# Patient Record
Sex: Female | Born: 1950
Health system: Southern US, Community
[De-identification: ages and names within clinical notes are randomized; demographics above are authoritative.]

## PROBLEM LIST (undated history)

## (undated) DIAGNOSIS — E119 Type 2 diabetes mellitus without complications: Secondary | ICD-10-CM

## (undated) DIAGNOSIS — I10 Essential (primary) hypertension: Secondary | ICD-10-CM

## (undated) DIAGNOSIS — E785 Hyperlipidemia, unspecified: Secondary | ICD-10-CM

## (undated) HISTORY — DX: Hyperlipidemia, unspecified: E78.5

## (undated) HISTORY — DX: Essential (primary) hypertension: I10

## (undated) HISTORY — DX: Type 2 diabetes mellitus without complications: E11.9

---

## 1998-07-08 ENCOUNTER — Emergency Department (HOSPITAL_COMMUNITY): Admission: EM | Admit: 1998-07-08 | Discharge: 1998-07-09 | Payer: Self-pay | Admitting: Internal Medicine

## 2001-01-22 ENCOUNTER — Encounter: Admission: RE | Admit: 2001-01-22 | Discharge: 2001-04-22 | Payer: Self-pay | Admitting: Family Medicine

## 2006-08-06 IMAGING — CR DG CHEST 2V
2 series · 2 of 2 positions shown · non-contrast
Comparison: none

CLINICAL DATA: Abdominal pain.  Acute cholecystitis and biliary dilatation.  Preop respiratory exam. 
 CHEST - 2 VIEW:

[w chest lat]
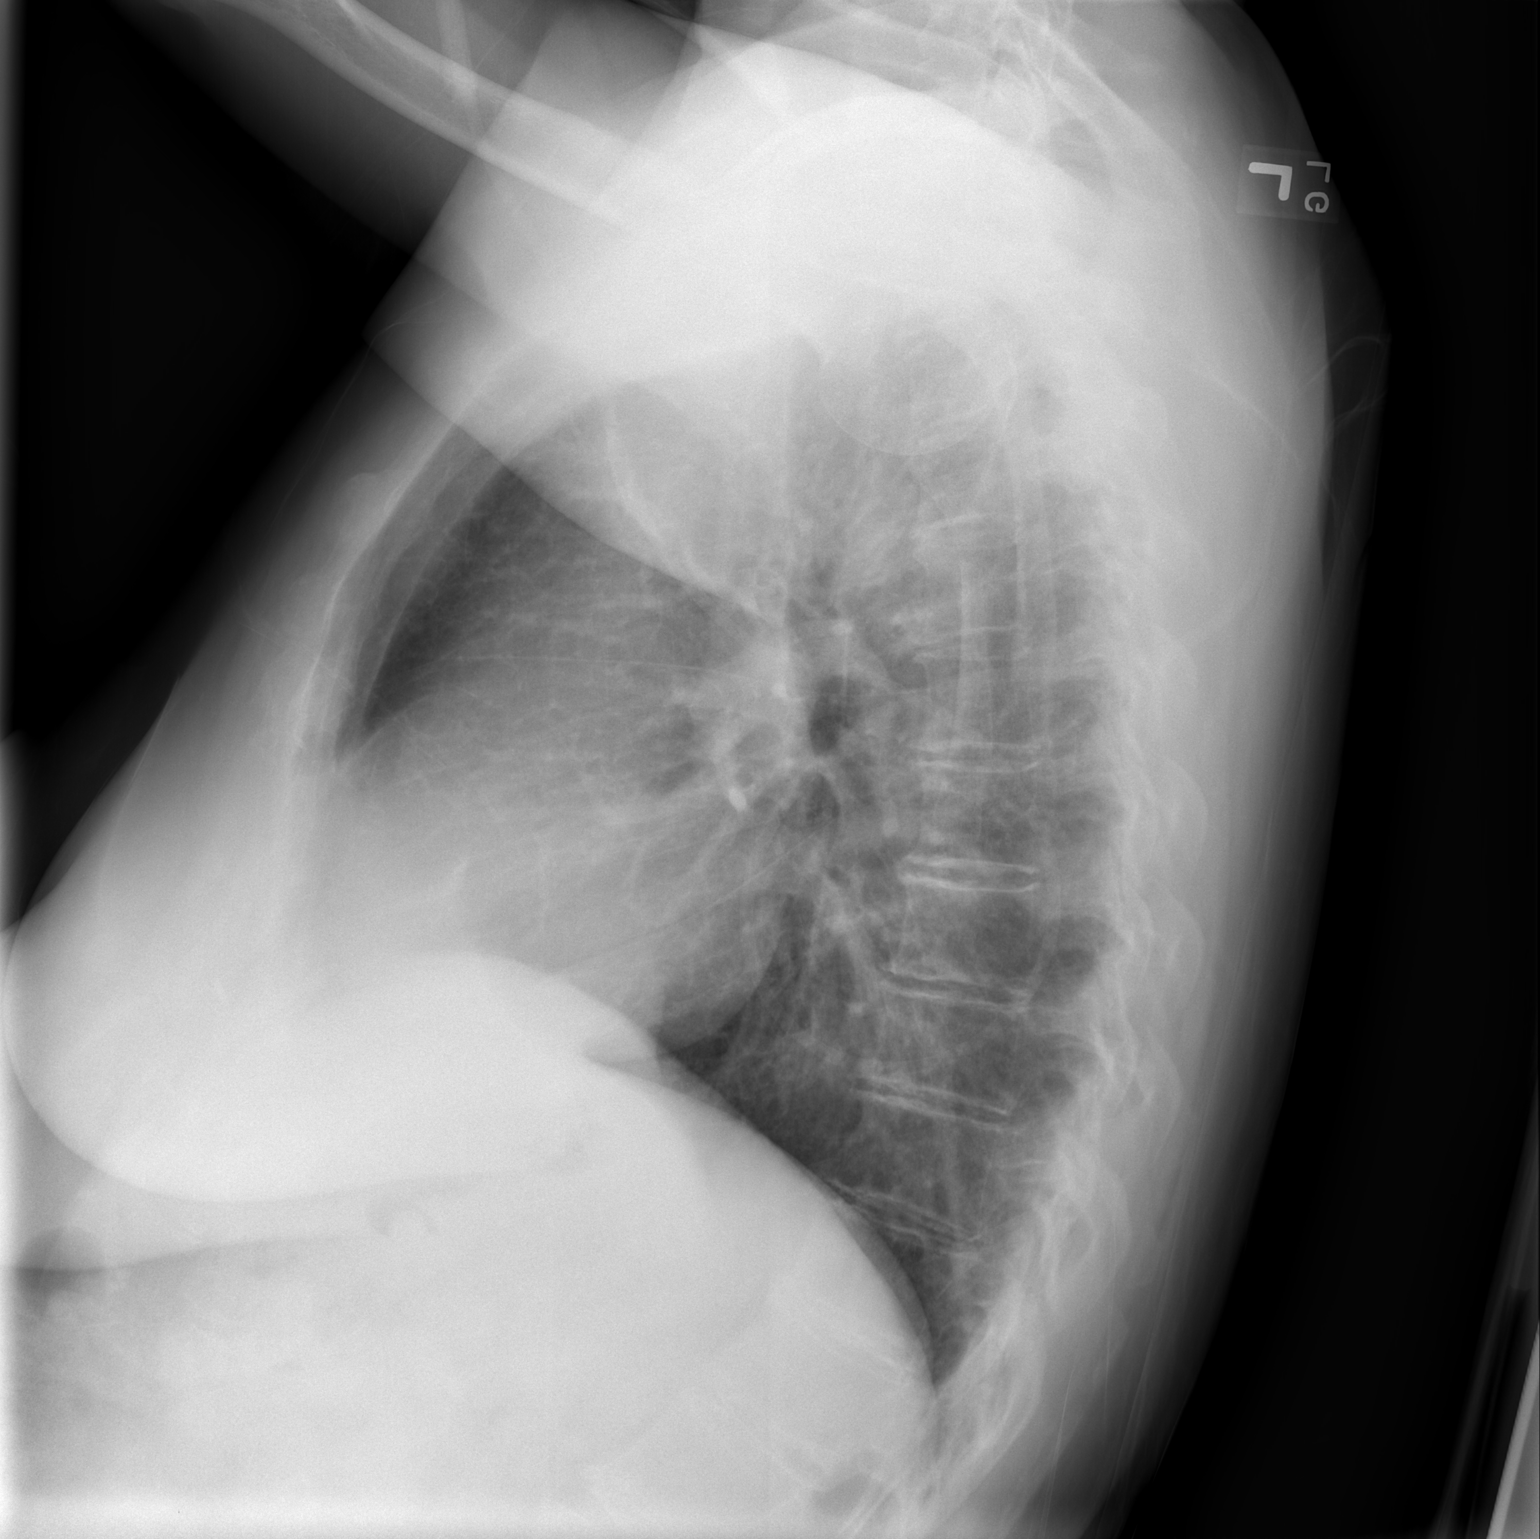

[view not recorded]
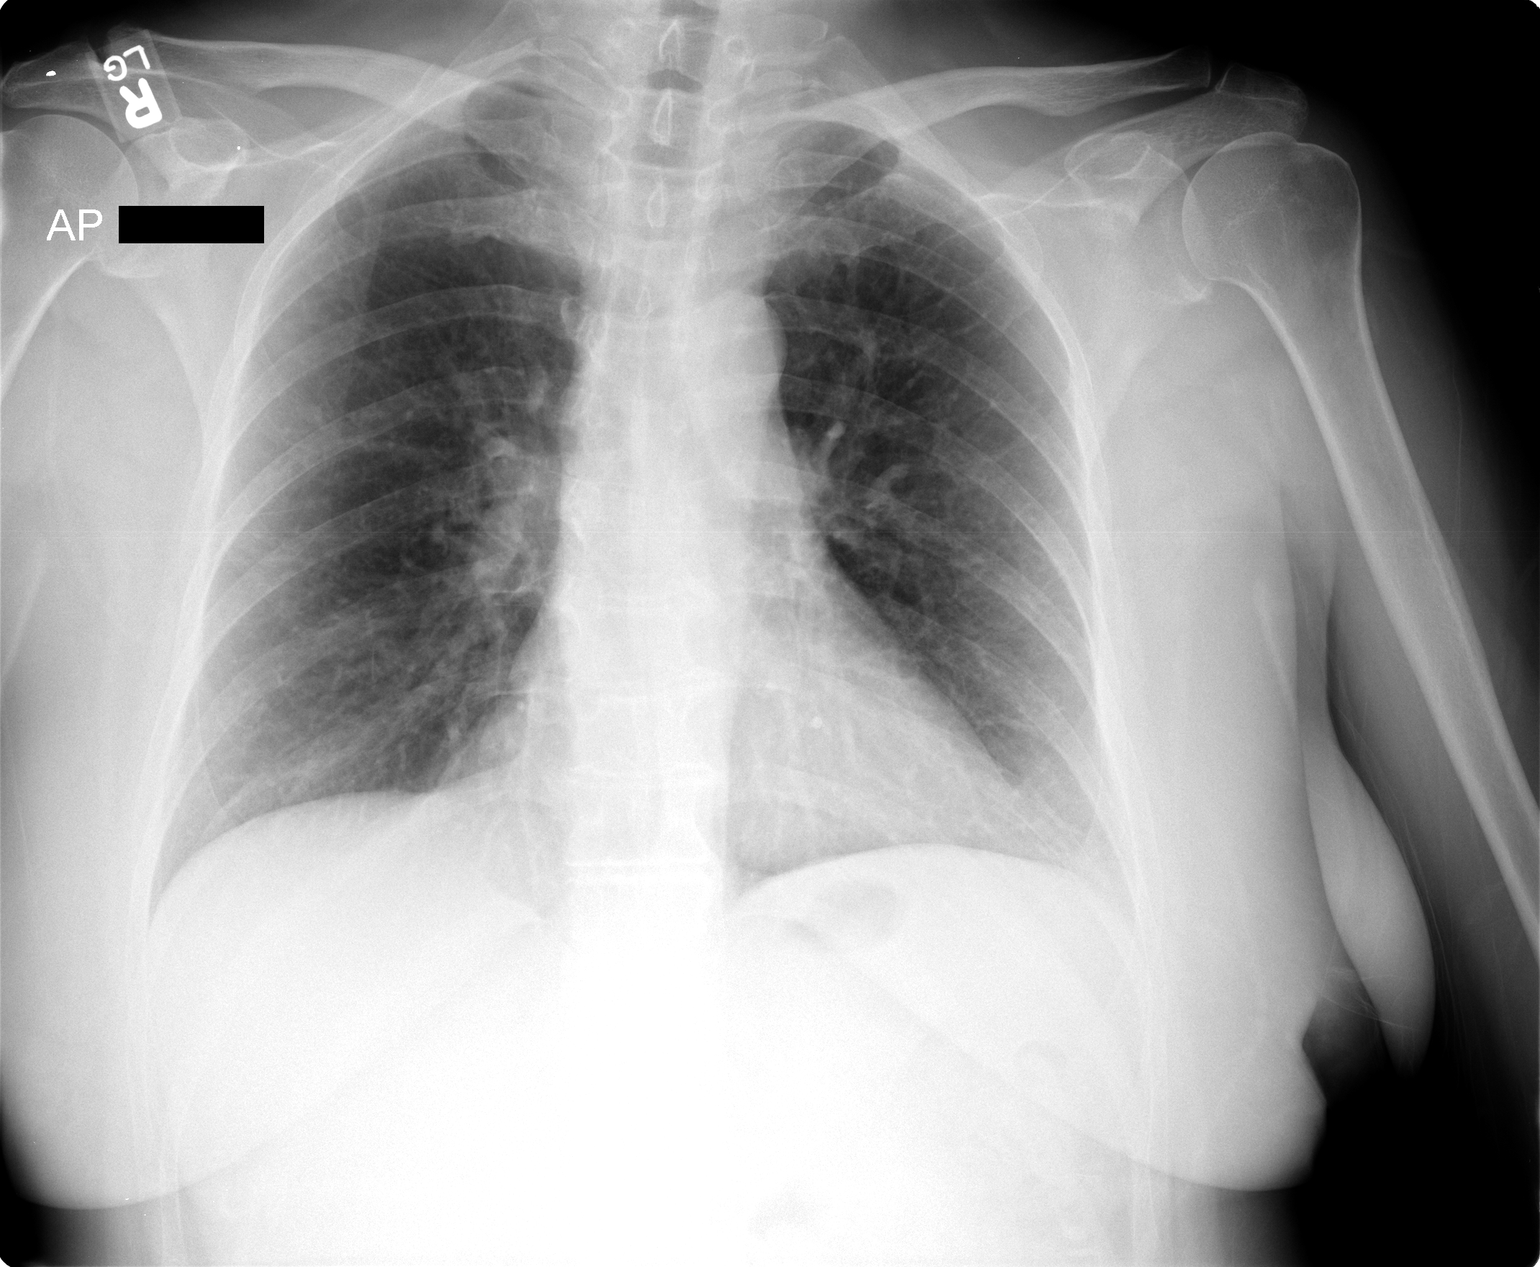

[2 of 2 positions shown; findings below may reference images not displayed]

FINDINGS: The heart size and mediastinal contours are within normal limits.  Both lungs are clear.  The visualized skeletal structures are unremarkable.
IMPRESSION: No active cardiopulmonary disease.

## 2007-09-13 ENCOUNTER — Encounter (INDEPENDENT_AMBULATORY_CARE_PROVIDER_SITE_OTHER): Payer: Self-pay | Admitting: *Deleted

## 2007-09-13 ENCOUNTER — Inpatient Hospital Stay (HOSPITAL_COMMUNITY): Admission: EM | Admit: 2007-09-13 | Discharge: 2007-09-15 | Payer: Self-pay | Admitting: Emergency Medicine

## 2007-09-13 IMAGING — RF DG CHOLANGIOGRAM OPERATIVE
1 series · 5 of 5 positions shown · non-contrast
Comparison: [REDACTED] abdominal ultrasound [DATE].

CLINICAL DATA: Cholecystitis.  Postcholecystectomy.
DIAGNOSTIC CHOLANGIOGRAM OPERATIVE:

[Series 1: run · 2 acquisitions, 5 frames shown]
[im 1/2]
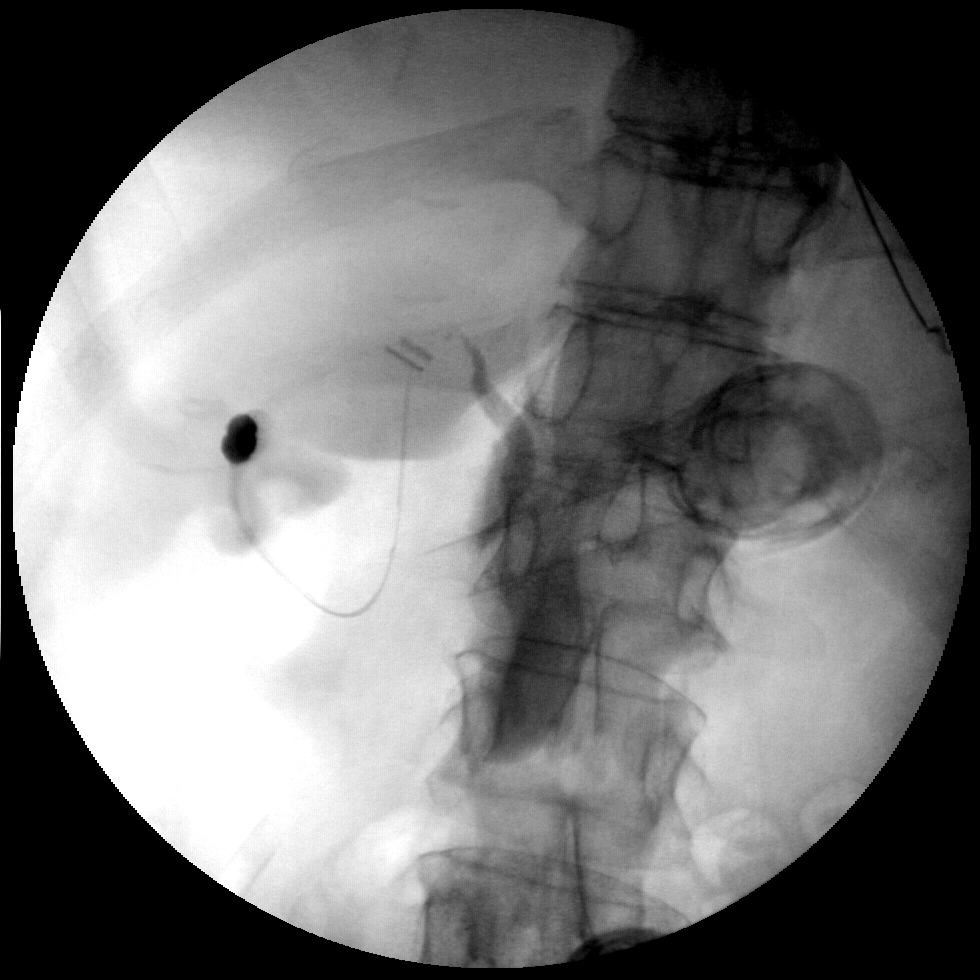
[im 1/2]
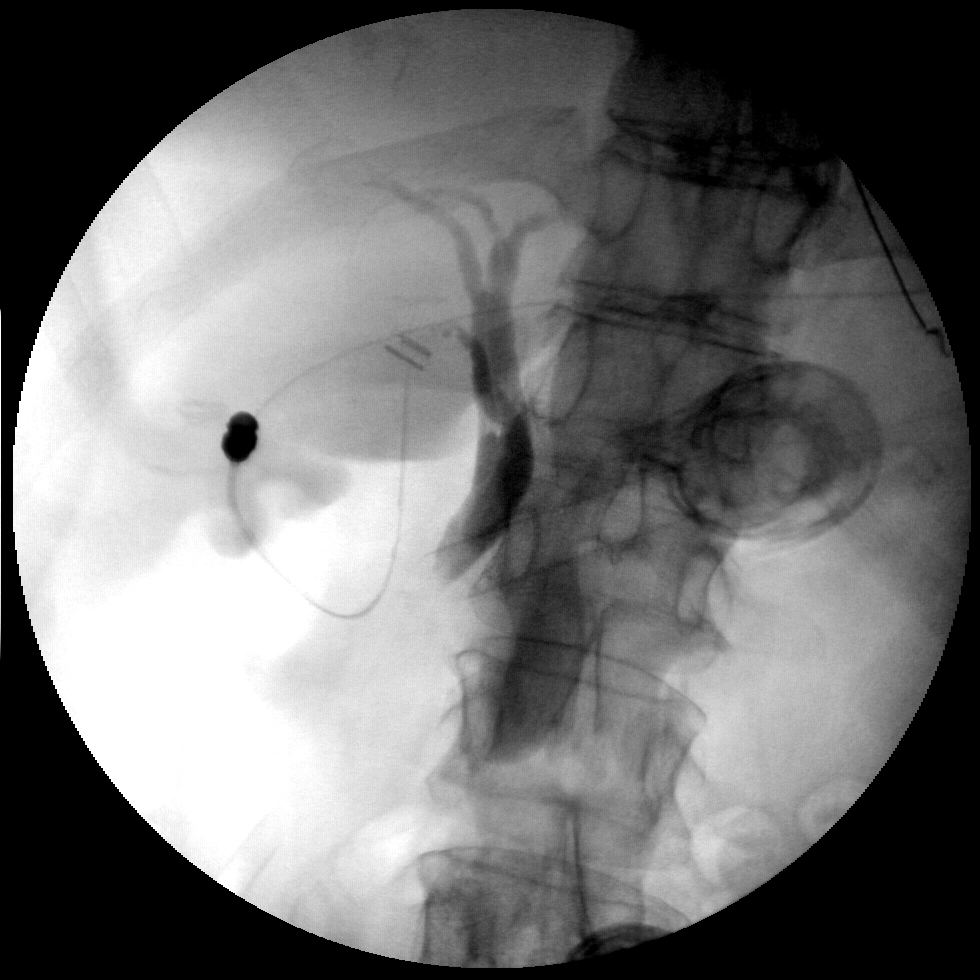
[im 1/2]
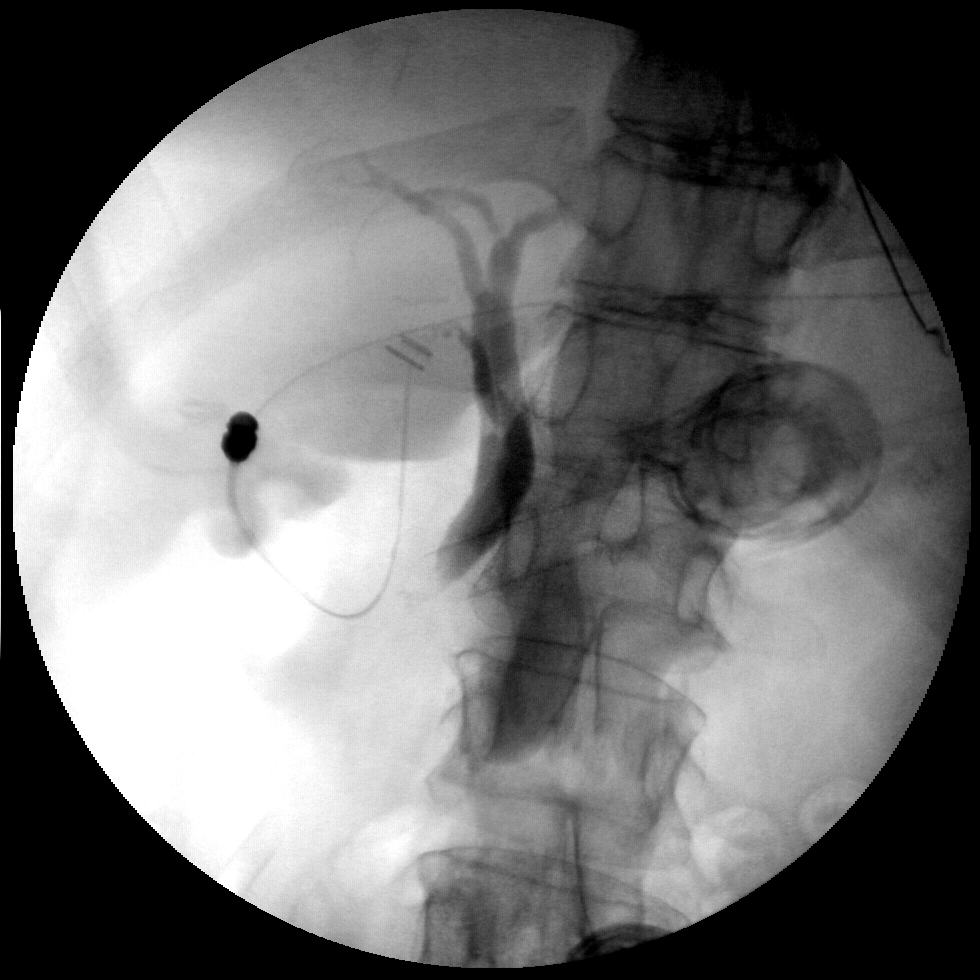
[im 1/2]
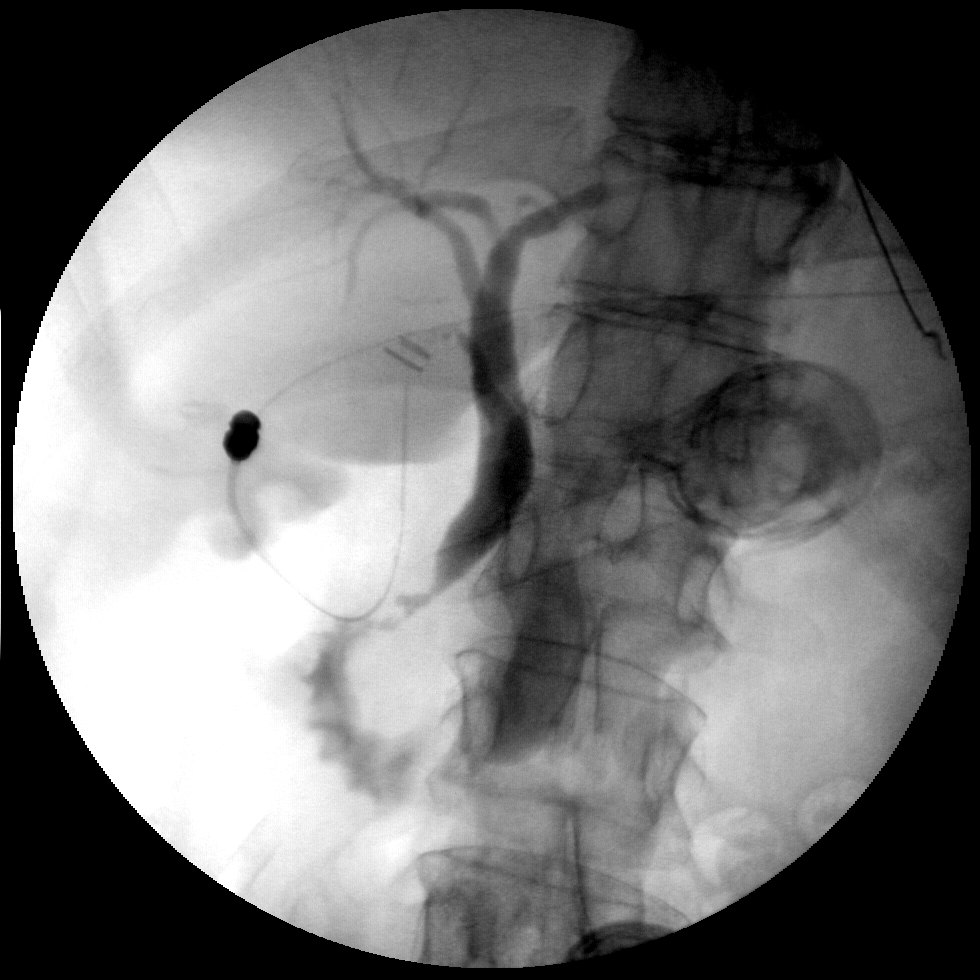
[im 2/2]
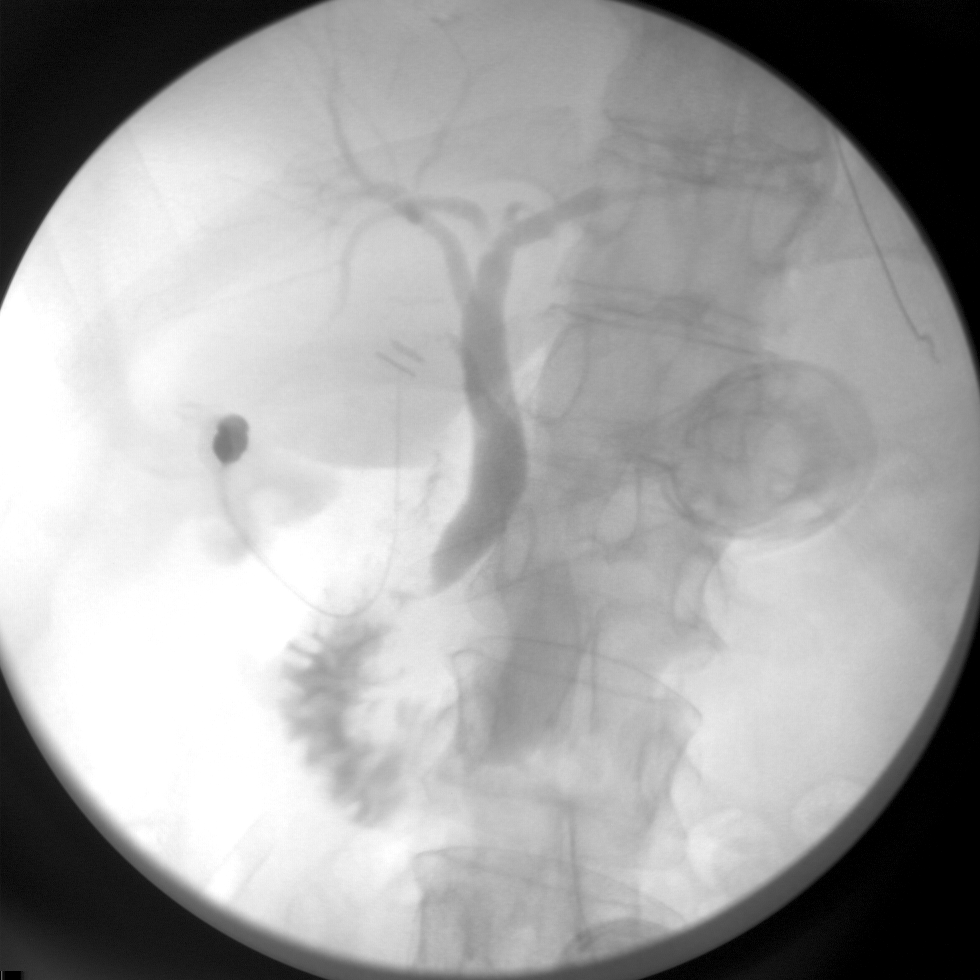

[5 of 5 positions shown; findings below may reference images not displayed]

FINDINGS: Interval postcholecystectomy noted with anatomic variant distal cystic duct insertion at common bile duct with resultant longer cystic duct remnant.  No persistent filling defects to suggest biliary tract retained calculus noted with patency to duodenum demonstrated.  Slight stable fusiform dilatation extrahepatic common bile duct noted.
IMPRESSION: 1.  Postcholecystectomy with anatomic variant longer cystic duct remnant/more distal insertion at common bile duct.
2.  Stable slight dilatation extrahepatic/common bile duct.  
3.  Otherwise no significant abnormality.

## 2007-09-13 IMAGING — US US ABDOMEN COMPLETE
1 series · 14 of 25 positions shown · non-contrast
Comparison: none

HISTORY: Abdominal pain

ULTRASOUND ABDOMEN COMPLETE:
TECHNIQUE: Complete abdominal ultrasound examination was performed including
evaluation of the liver, gallbladder, bile ducts, pancreas, kidneys, spleen,
IVC, and abdominal aorta.

[Series 1: unknown · 0.33mm/px · 14 of 86 slices shown]
[im 1/86]
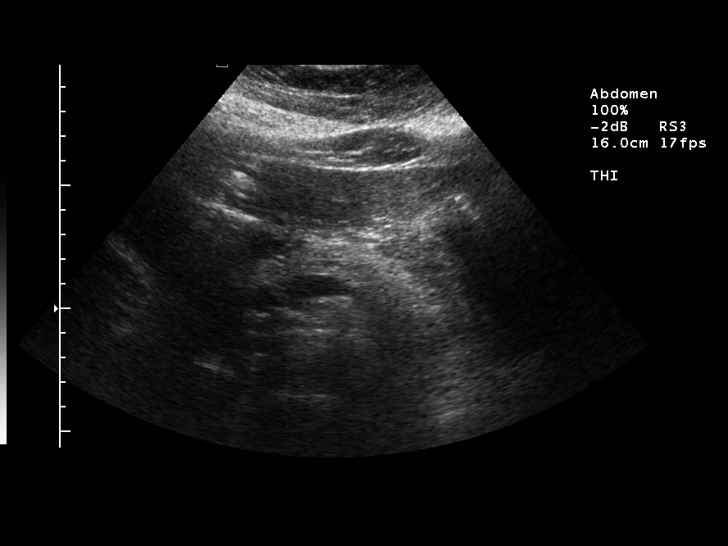
[im 8/86]
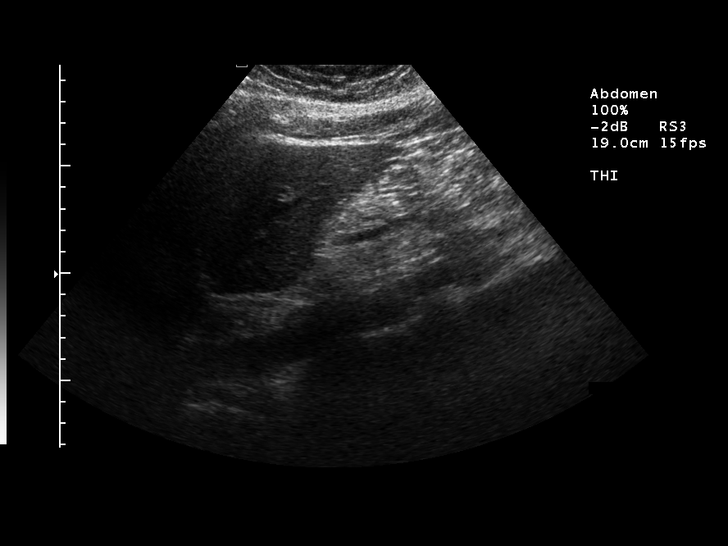
[im 15/86]
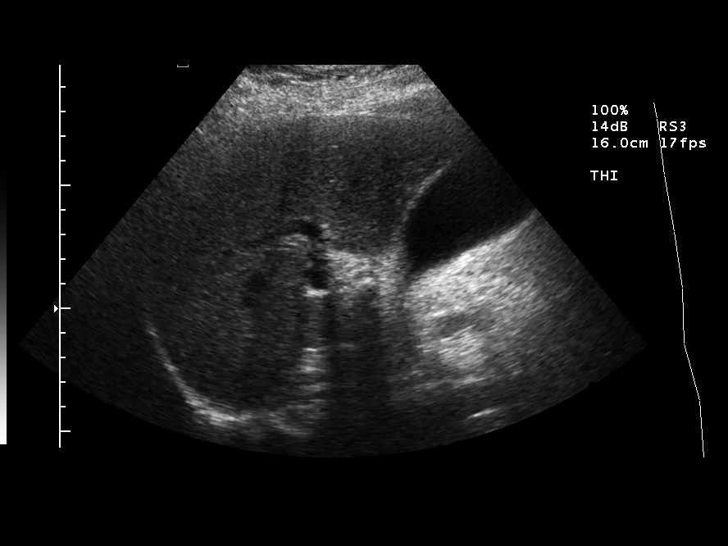
[im 22/86]
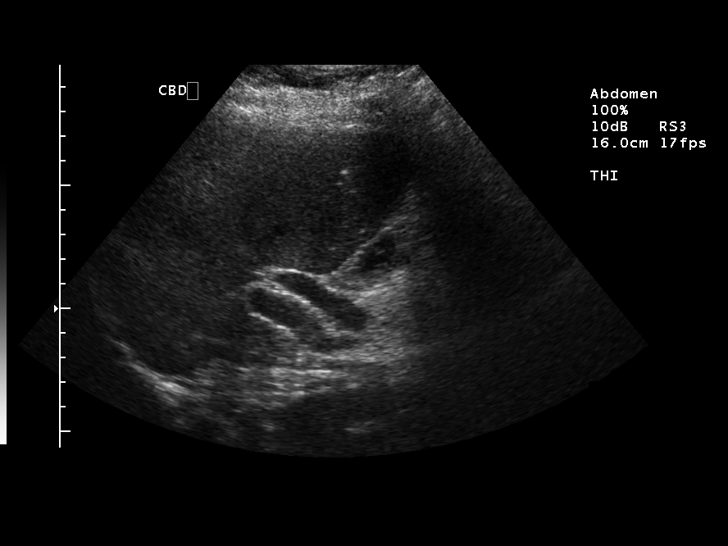
[im 29/86]
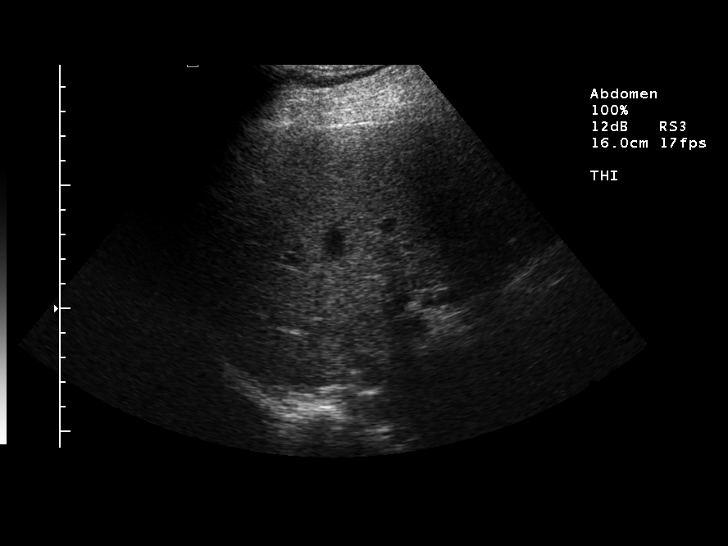
[im 32/86]
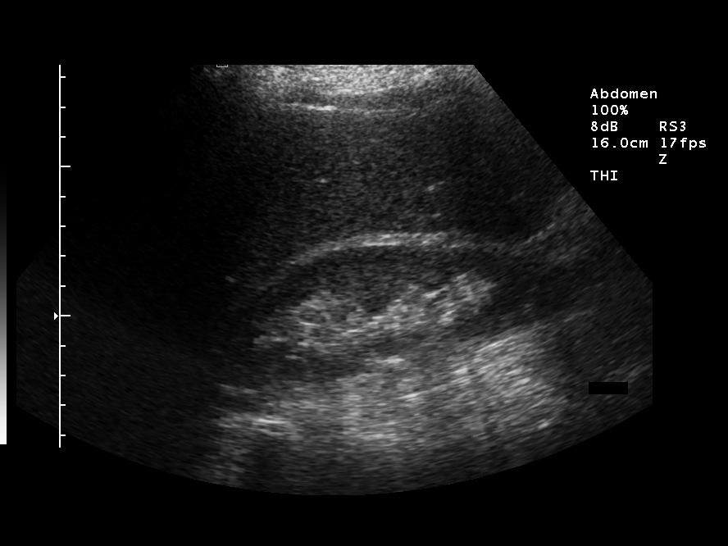
[im 39/86]
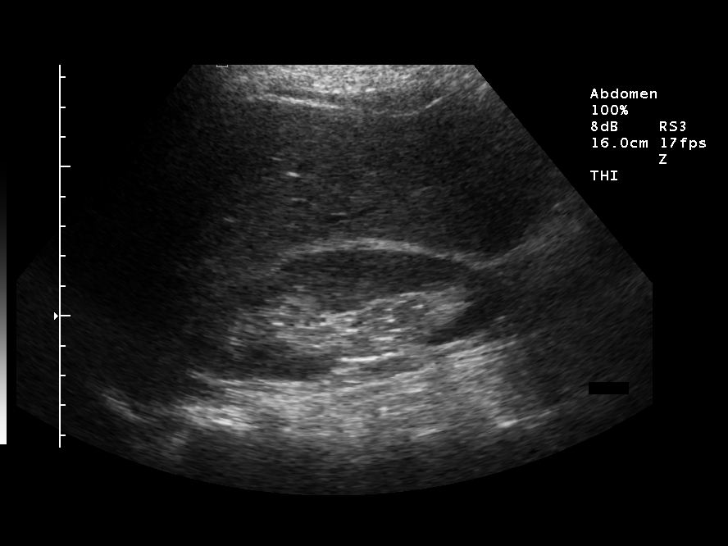
[im 47/86]
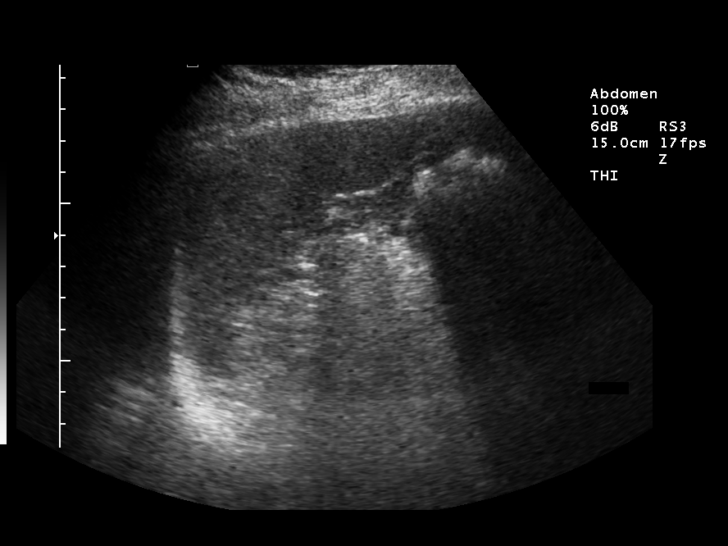
[im 54/86]
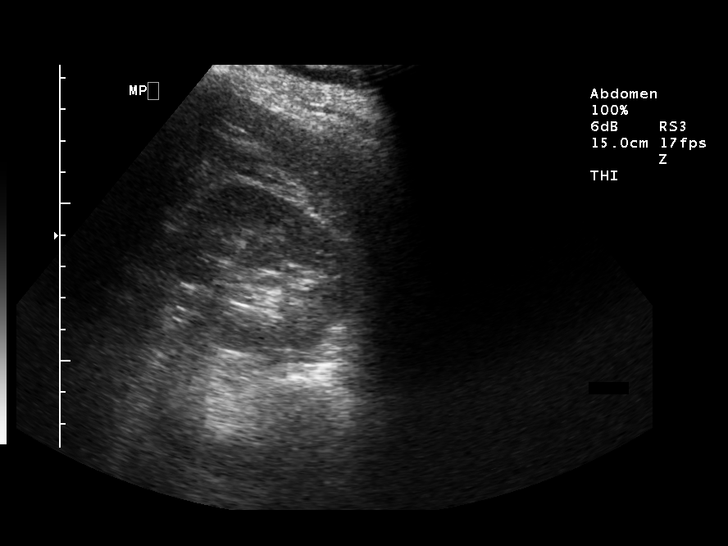
[im 57/86]
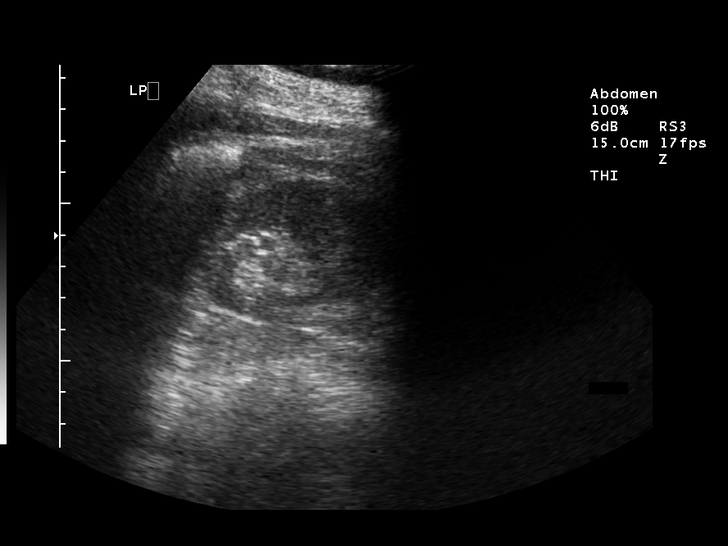
[im 64/86]
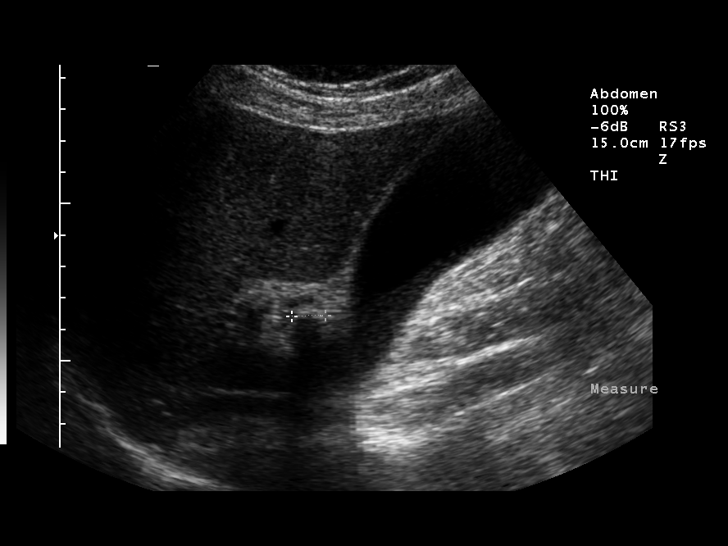
[im 71/86]
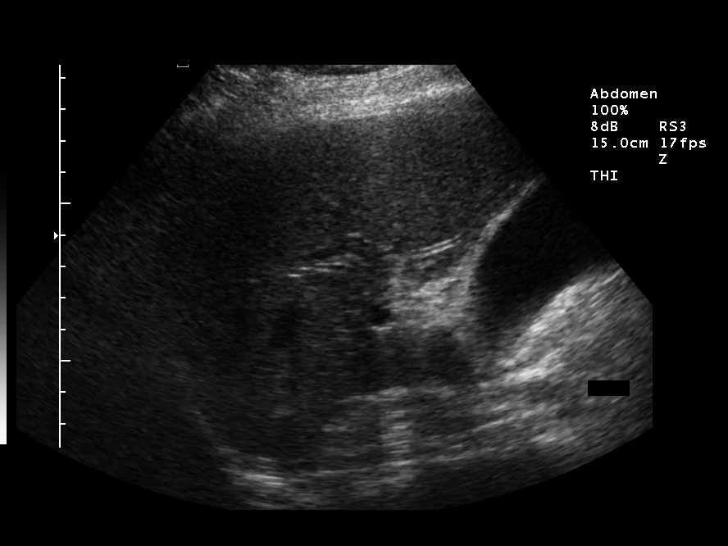
[im 78/86]
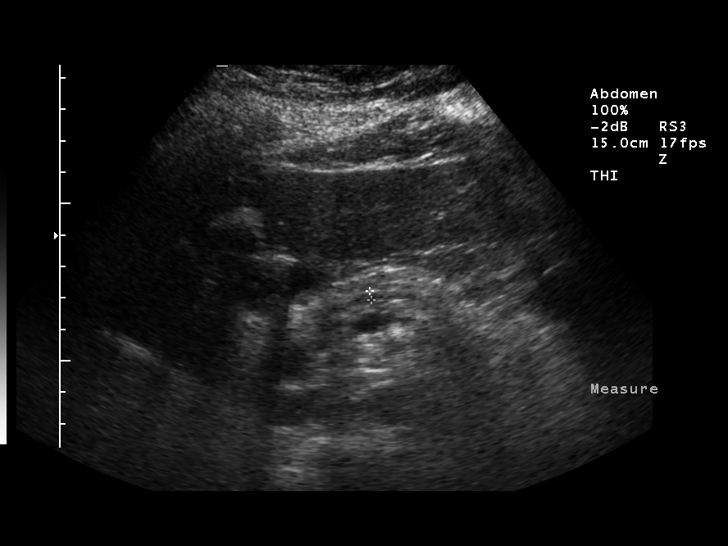
[im 86/86]
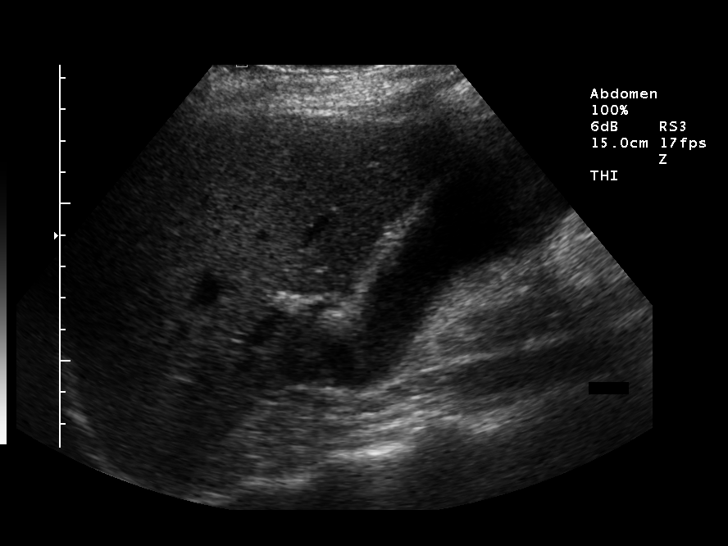

[14 of 25 positions shown; findings below may reference images not displayed]

Dependent sludge in gallbladder.
Shadowing echogenic focus is seen in gallbladder neck suspicious for gallstone,
approximately 10 mm diameter.
Gallbladder wall upper normal thickness 3 mm diameter.
Common bile duct mildly dilated 9 mm diameter.
Mild central intrahepatic biliary dilatation.
No focal mass lesion of liver, pancreas, or spleen, spleen 8.3 cm length.
Common bile duct remains dilated at pancreatic head.
Kidneys normal size and morphology, 10.7 cm length right and 11.4 cm left.
Aorta and IVC normal.
No free fluid.
IMPRESSION: Probable shadowing gallstone at gallbladder neck with small amount of
gallbladder sludge and presence of sonographic Murphy's sign.
Findings may represent early acute cholecystitis.
Dilated common bile duct, cannot exclude distal CBD obstruction, recommend
correlation with LFTs.
Remainder of exam unremarkable.

## 2008-03-03 ENCOUNTER — Emergency Department (HOSPITAL_COMMUNITY): Admission: EM | Admit: 2008-03-03 | Discharge: 2008-03-03 | Payer: Self-pay | Admitting: Emergency Medicine

## 2008-03-03 IMAGING — CR DG ABDOMEN ACUTE W/ 1V CHEST
3 series · 3 of 3 positions shown · non-contrast
Comparison: Abdominal ultrasound [DATE] and chest x-ray
[DATE]

CLINICAL DATA: Abdominal pain

ACUTE ABDOMEN SERIES (ABDOMEN 2 VIEW & CHEST 1 VIEW)

[w chest pa]
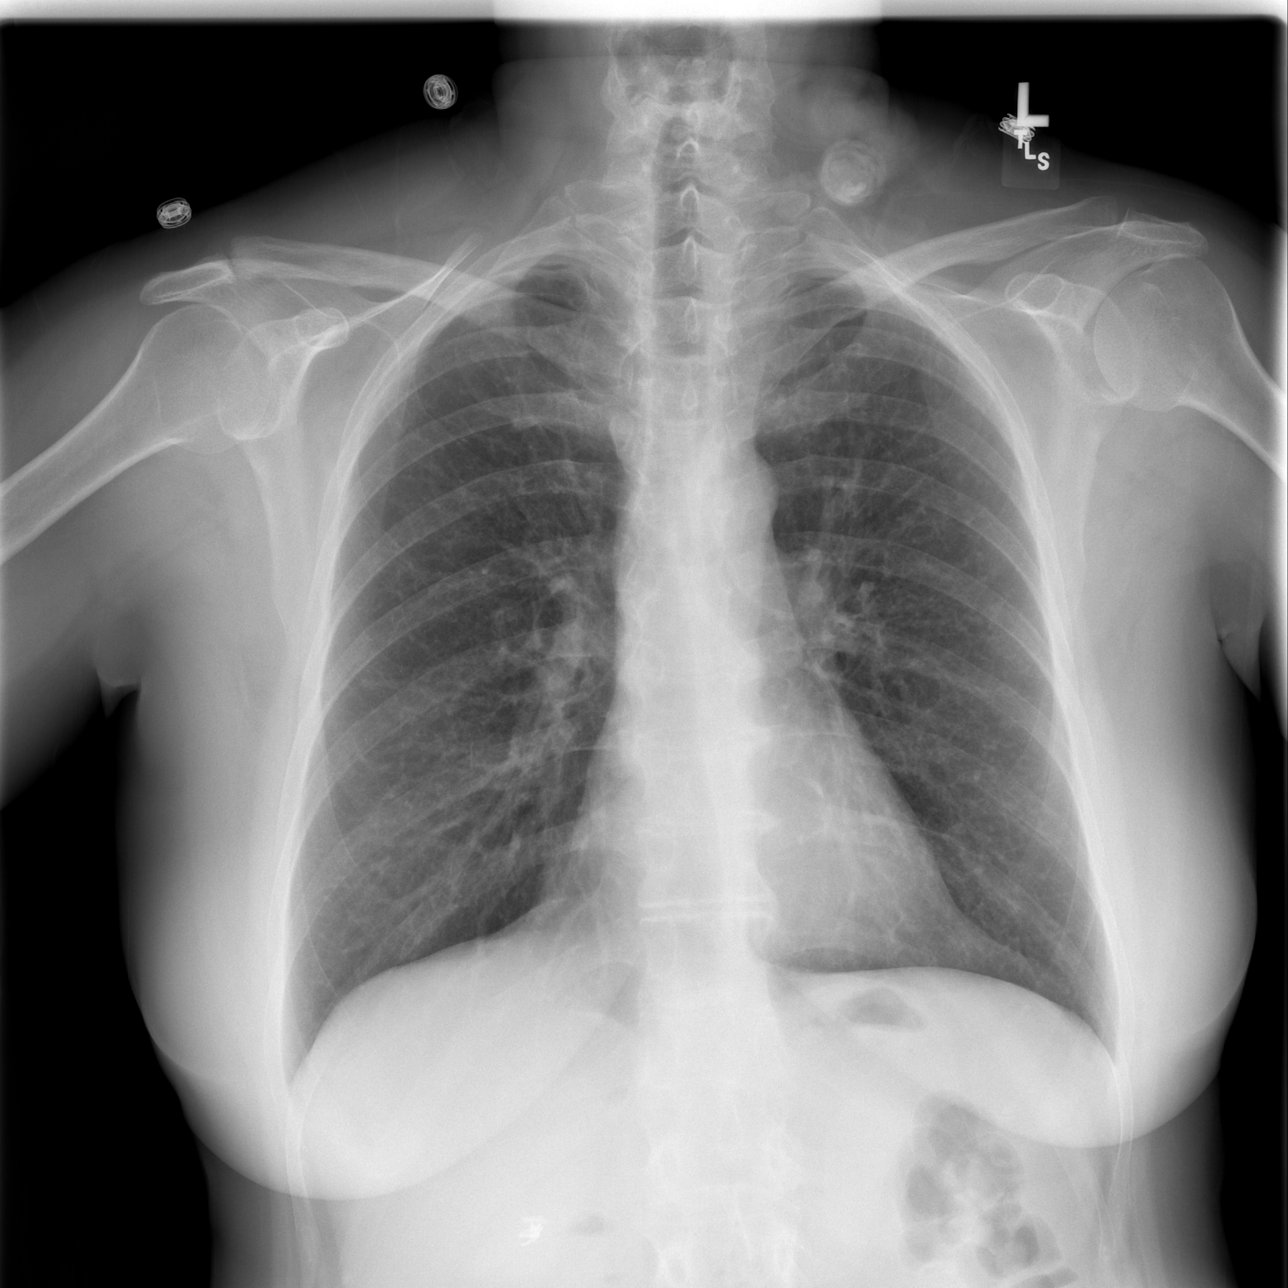

[w abdomen upright]
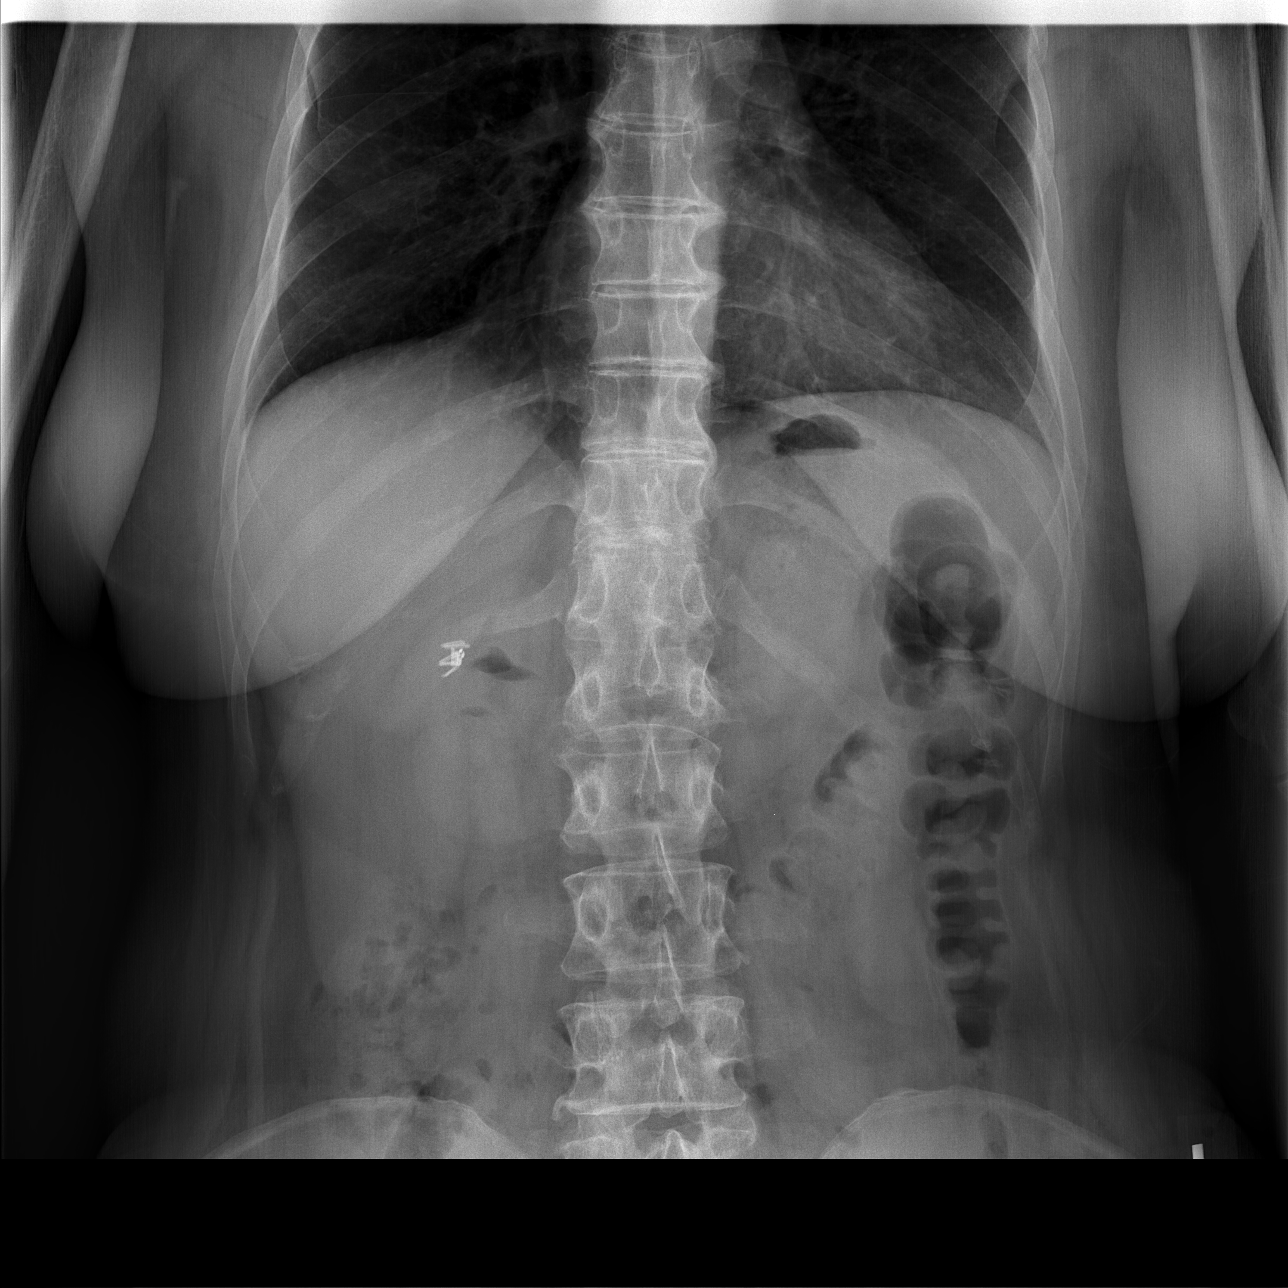

[t abdomen supine]
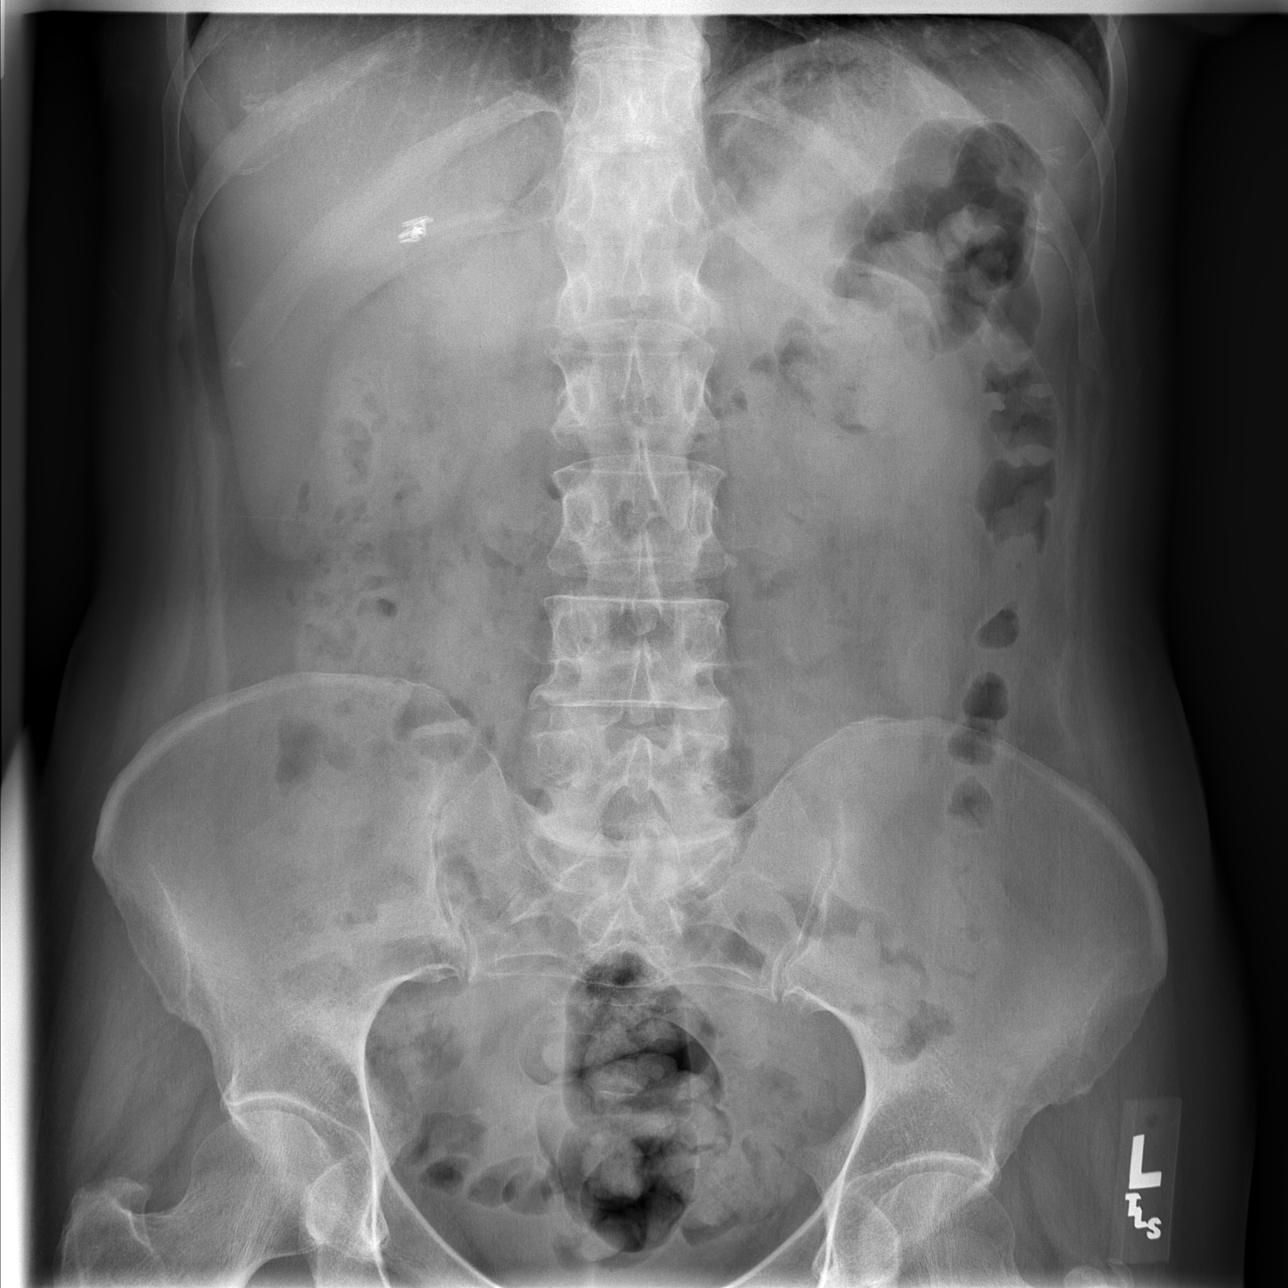

[3 of 3 positions shown; findings below may reference images not displayed]

FINDINGS: The heart and mediastinal contours are within normal
limits and the trachea is midline.  Both lungs are clear.  A
rounded density projecting to the left of the lower cervical spine
was not present on the prior radiograph most likely relates to the
patient's hair.

No free intraperitoneal air beneath the diaphragm.  Cholecystectomy
clips are noted in the right upper quadrant.

The bowel gas pattern is nonobstructive.  No radiopaque calculi are
identified along the expected course of the urinary tract.  There
is small osteophyte formation associated with L3 and L4  and in the
lower thoracic spine.  A tiny calcification in the patient's right
pelvis is favored to represent a phlebolith.
IMPRESSION: 1.  No evidence of acute cardiopulmonary disease.
2.  Nonobstructive bowel gas pattern.
3.  Status post cholecystectomy.

## 2011-04-05 NOTE — Op Note (Signed)
Morgan Ruiz, Morgan Ruiz             ACCOUNT NO.:  [id number]   MEDICAL RECORD NO.:  [id number]          PATIENT TYPE:  INP   LOCATION:  1405                         FACILITY:  [facility]   PHYSICIAN:  Alfonse Ras, MD   DATE OF BIRTH:  03/29/1951   DATE OF PROCEDURE:  09/13/2007  DATE OF DISCHARGE:                               OPERATIVE REPORT   PREOPERATIVE DIAGNOSIS:  Acute cholecystitis.   POSTOPERATIVE DIAGNOSIS:  Acute cholecystitis.   PROCEDURE:  Laparoscopic cholecystectomy with intraoperative  cholangiogram.   SURGEON:  Alfonse Ras, M.D.   ANESTHESIA:  General endotracheal tube.   FINDINGS:  Gangrenous cholecystitis.   DESCRIPTION OF PROCEDURE:  The patient was taken to the operating room,  placed in supine position.  After adequate general anesthesia was  induced using an endotracheal tube, the abdomen was prepped and draped  in the normal sterile fashion.  Using a transverse infraumbilical  incision, I dissected down to fascia.  The fascia was opened vertically.  A 0 Vicryl pursestring suture was placed around the fascial defect.  Hasson trocar was placed in the abdomen and pneumoperitoneum was  obtained.  Under direct vision, a 11 mm trocar was placed in the  subxiphoid region and two 5 mm trocars were placed in the right abdomen.  A very edematous, thick-walled gallbladder was identified with a lot of  omentum adherent to it.  This was taken down with blunt dissection.  The  gallbladder was then aspirated and retracted cephalad.  There was a  significant amount of woody adhesions to the base of the gallbladder.  After tedious dissection, critical view was obtained of the cystic duct.  It was clipped proximally.  Small ductotomy was made and cholangiogram  was performed, which showed normal filling of the common duct, free flow  into the duodenum, and normal filling of the right and left hepatic  ducts.  The cholangiocatheter was removed.  Cystic duct was  triply  clipped and divided.  Cystic artery was dissected in a similar fashion,  triply   Dictation ended at this point.      Alfonse Ras, MD  Electronically Signed     KRE/MEDQ  D:  09/13/2007  T:  09/13/2007  Job:  [id number]

## 2011-04-05 NOTE — H&P (Signed)
NAMEMYLEI, BRACKEEN             ACCOUNT NO.:  [id number]   MEDICAL RECORD NO.:  [id number]          PATIENT TYPE:  INP   LOCATION:  1405                         FACILITY:  [facility]   PHYSICIAN:  Sandria Bales. Ezzard Standing, M.D.  DATE OF BIRTH:  12/17/1950   DATE OF ADMISSION:  09/12/2007  DATE OF DISCHARGE:                              HISTORY & PHYSICAL   HISTORY OF ILLNESS:  This is a 60 year old white female who was seen by  Dr. Leslye Peer at [place], who is originally from [place], then moved to  [place], then in 1999 came to the [place] to follow her daughter.  She has had some vague GI upset since she has been in the [place],  but as best I could tell has not had any evaluation.  She has somewhat  of an accent and there may be a little bit of a language barrier but I  think she understands pretty well what is going on.   Over the last 10 days she has had worsening abdominal pain.  Over the  last 24 hours it got bad, had to go to [employer].  She was then sent to  [facility] last night where she underwent an evaluation.  An  ultrasound showed shadowing gallstones in the neck of the gallbladder.  She had an elevated white blood count to 16,000.  And, I was consulted  for evaluation of probable cholecystitis.  She has had no prior abdominal surgery, denies history of peptic ulcer  disease, liver disease, or colon disease.   PAST MEDICAL HISTORY:  She has no allergies.   CURRENT MEDICATIONS:  1. Amoxicillin that she is taking for a sore throat.  2. Toradol.   REVIEW OF SYSTEMS:  NEUROLOGIC:  Denies seizure or loss of  consciousness.  PULMONARY:  She smokes cigarettes.  CARDIAC:  There is no heart disease or chest pain.  GASTROINTESTINAL:  See history of present illness.  UROLOGIC:  No history of kidney stones or kidney infections.   She apparently has just lost her job but was working at some type of  [employer].  Her husband works, it sounds like, he is kind of  a  maintenance person at the [employer].  He is not in the room  with her.   PHYSICAL EXAMINATION:  VITAL SIGNS:  Her temperature is 97.6.  Her pulse  is 76.  Her blood pressure is 128/73.  GENERAL:  She is a well nourished, middle weight, older female.  She  looks a little bit older than her stated age.  HEENT:  She is missing some teeth.  Her HEENT is unremarkable.  NECK:  Supple.  I feel no mass, no thyromegaly.  She is maybe a little  sore along her left neck.  LUNGS:  Clear to auscultation.  HEART:  Regular regular rate and rhythm without murmur or rub.  ABDOMEN:  Soft, though she is tender and sore in the right upper  quadrant.  I am not sure I feel a mass.  I feel no hernia.  She has no  other abdominal scars.  EXTREMITIES:  She has good strength in all 4 extremities.  NEUROLOGIC:  Grossly intact.   LABORATORY DATA:  Shows a white blood cell count of 16,100, hemoglobin  14, hematocrit 41.  Her sodium is 136, potassium 3.7, CO2 25, glucose  125.  Her SGOT 198, SGPT 116.  Her alk phos is 82, total bilirubin 0.7,  lipase is 24.   She had an ultrasound of her gallbladder again that shows what looks  like some gallbladder sludge and a stone impacted in the neck of her  gallbladder.   IMPRESSION:  1. Cholelithiasis with a cholecystitis.  I discussed with the patient      about proceeding with surgery.  I discussed with her the      indications and potential complications of gallbladder surgery.  I      talked to her about infection which I think she already has as she      is on antibiotics, the possibility of bleeding from surgery, open      surgery, possible common duct injury.  I told her that Dr. Baruch Merl in on call for [place] today and I will be  speaking with Dr. Colin Benton about managing her care.  1. Smokes cigarettes.  2. Recent upper respiratory infections.      Sandria Bales. Ezzard Standing, M.D.  Electronically Signed     DHN/MEDQ  D:  09/13/2007  T:  09/13/2007   Job:  [id number]   cc:   Dr. Leslye Peer at [facility]

## 2011-08-16 LAB — DIFFERENTIAL
Basophils Relative: 0
Lymphocytes Relative: 13
Lymphs Abs: 1.5
Monocytes Relative: 4
Neutrophils Relative %: 83 — ABNORMAL HIGH

## 2011-08-16 LAB — POCT CARDIAC MARKERS
CKMB, poc: <1 — ABNORMAL LOW
Myoglobin, poc: 42.3
Operator id: 146091

## 2011-08-16 LAB — CBC
HCT: 40.5
MCHC: 34.2
MCV: 88.7
RBC: 4.57
WBC: 12.1 — ABNORMAL HIGH

## 2011-08-16 LAB — URINALYSIS, ROUTINE W REFLEX MICROSCOPIC
Glucose, UA: NEGATIVE
Hgb urine dipstick: NEGATIVE
Nitrite: NEGATIVE
Protein, ur: NEGATIVE
Specific Gravity, Urine: 1.01
Urobilinogen, UA: 1
pH: 7.5

## 2011-08-16 LAB — POCT I-STAT, CHEM 8
BUN: 16
HCT: 42
Hemoglobin: 14.3
Sodium: 139

## 2011-08-16 LAB — LIPASE, BLOOD: Lipase: 32

## 2011-08-16 LAB — HEPATIC FUNCTION PANEL
Albumin: 4
Bilirubin, Direct: 0.1
Total Bilirubin: 0.6
Total Protein: 7.3

## 2011-08-31 LAB — CBC
HCT: 34.3 — ABNORMAL LOW
HCT: 38.8
HCT: 41
Hemoglobin: 11.8 — ABNORMAL LOW
Hemoglobin: 14.1
MCHC: 34.4
MCHC: 34.4
MCV: 87.3
MCV: 87.7
Platelets: 378
Platelets: 452 — ABNORMAL HIGH
RBC: 3.92
RBC: 4.45
RBC: 4.7
RDW: 12.3
RDW: 12.6
RDW: 12.8
WBC: 12.1 — ABNORMAL HIGH
WBC: 16.1 — ABNORMAL HIGH
WBC: 18 — ABNORMAL HIGH

## 2011-08-31 LAB — COMPREHENSIVE METABOLIC PANEL
ALT: 116 — ABNORMAL HIGH
ALT: 381 — ABNORMAL HIGH
AST: 104 — ABNORMAL HIGH
AST: 98 — ABNORMAL HIGH
Albumin: 2.8 — ABNORMAL LOW
Albumin: 3.8
Alkaline Phosphatase: 148 — ABNORMAL HIGH
Alkaline Phosphatase: 82
BUN: 6
BUN: 7
CO2: 25
CO2: 31
Calcium: 9.3
Calcium: 9.6
Chloride: 102
Chloride: 103
Creatinine, Ser: 0.59
Creatinine, Ser: 0.75
GFR calc Af Amer: >60
GFR calc Af Amer: >60
GFR calc non Af Amer: >60
GFR calc non Af Amer: >60
Glucose, Bld: 122 — ABNORMAL HIGH
Glucose, Bld: 125 — ABNORMAL HIGH
Potassium: 3.7
Potassium: 3.9
Sodium: 136
Sodium: 139
Total Bilirubin: 0.7
Total Bilirubin: 0.9
Total Protein: 6.5
Total Protein: 7.5

## 2011-08-31 LAB — DIFFERENTIAL
Basophils Absolute: 0
Basophils Absolute: 0
Basophils Relative: 0
Eosinophils Absolute: 0.2
Eosinophils Relative: 0
Eosinophils Relative: 1
Lymphocytes Relative: 16
Lymphs Abs: 2.6
Monocytes Absolute: 0.9 — ABNORMAL HIGH
Monocytes Absolute: 0.9 — ABNORMAL HIGH
Monocytes Relative: 6
Neutro Abs: 12.4 — ABNORMAL HIGH
Neutro Abs: 8.6 — ABNORMAL HIGH
Neutrophils Relative %: 77

## 2011-08-31 LAB — LIPASE, BLOOD: Lipase: 24

## 2011-08-31 LAB — URINALYSIS, ROUTINE W REFLEX MICROSCOPIC: Nitrite: NEGATIVE

## 2011-08-31 LAB — CARDIAC PANEL(CRET KIN+CKTOT+MB+TROPI)
CK, MB: 0.5
Relative Index: INVALID
Total CK: 54
Troponin I: 0.01

## 2011-09-26 ENCOUNTER — Other Ambulatory Visit: Payer: Self-pay | Admitting: Infectious Diseases

## 2011-09-26 ENCOUNTER — Ambulatory Visit
Admission: RE | Admit: 2011-09-26 | Discharge: 2011-09-26 | Disposition: A | Discharge status: Home / Self Care | Payer: PRIVATE HEALTH INSURANCE | Source: Ambulatory Visit | Attending: Infectious Diseases | Admitting: Infectious Diseases

## 2011-09-26 DIAGNOSIS — R7611 Nonspecific reaction to tuberculin skin test without active tuberculosis: Secondary | ICD-10-CM

## 2011-09-26 IMAGING — CR DG CHEST 1V
1 series · 1 of 1 positions shown · non-contrast
Comparison: Two-view chest radiograph [DATE].

CLINICAL DATA: Positive PPD.

CHEST - 1 VIEW

[view not recorded]
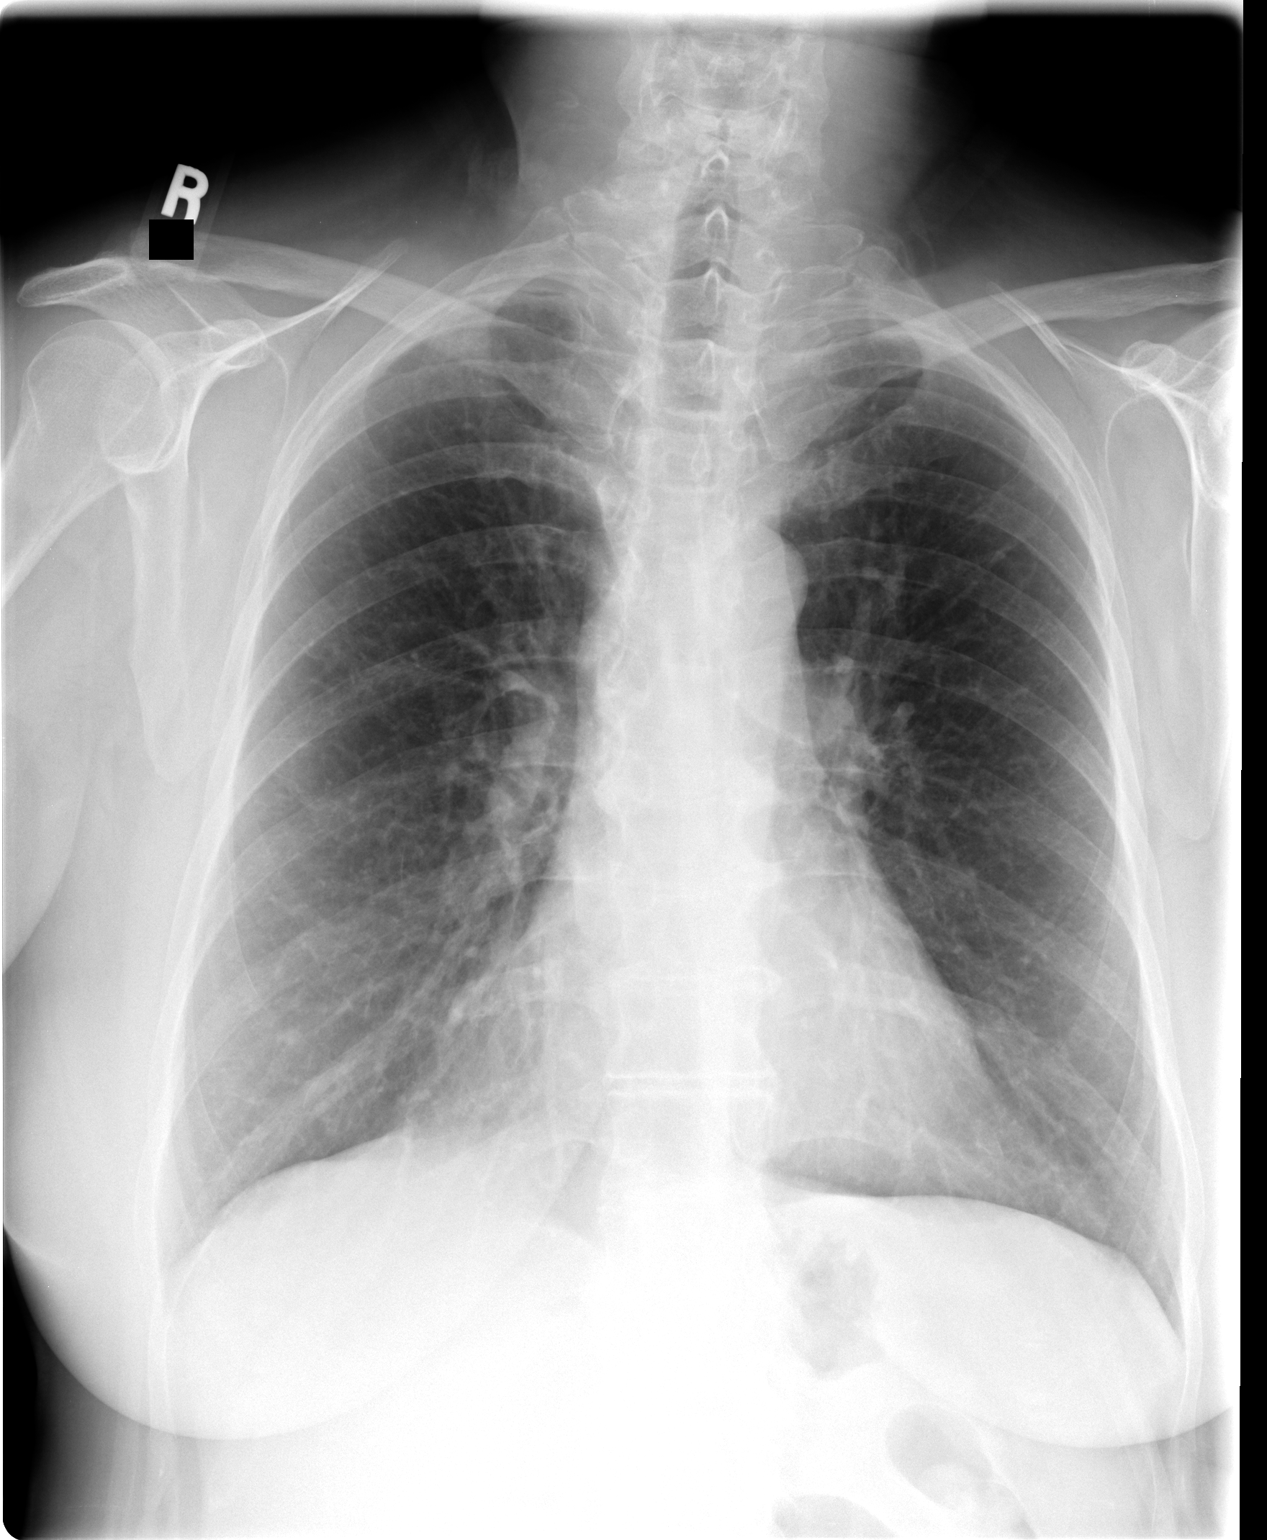

[1 of 1 positions shown; findings below may reference images not displayed]

FINDINGS: The heart size is normal.  The lungs are clear.  The
visualized soft tissues and bony thorax are unremarkable.  Surgical
clips are noted in the gallbladder fossa.
IMPRESSION: Negative chest.

## 2016-01-24 ENCOUNTER — Other Ambulatory Visit: Payer: Self-pay | Admitting: Internal Medicine

## 2016-01-24 ENCOUNTER — Ambulatory Visit (INDEPENDENT_AMBULATORY_CARE_PROVIDER_SITE_OTHER): Payer: BLUE CROSS/BLUE SHIELD | Admitting: Internal Medicine

## 2016-01-24 VITALS — BP 124/60 | HR 68 | Temp 97.7°F | Resp 12 | Ht 65.5 in | Wt 181.0 lb

## 2016-01-24 DIAGNOSIS — I10 Essential (primary) hypertension: Secondary | ICD-10-CM | POA: Insufficient documentation

## 2016-01-24 DIAGNOSIS — R630 Anorexia: Secondary | ICD-10-CM

## 2016-01-24 DIAGNOSIS — R5383 Other fatigue: Secondary | ICD-10-CM

## 2016-01-24 DIAGNOSIS — E119 Type 2 diabetes mellitus without complications: Secondary | ICD-10-CM | POA: Diagnosis not present

## 2016-01-24 DIAGNOSIS — E785 Hyperlipidemia, unspecified: Secondary | ICD-10-CM | POA: Diagnosis not present

## 2016-01-24 DIAGNOSIS — R0981 Nasal congestion: Secondary | ICD-10-CM

## 2016-01-24 LAB — COMPREHENSIVE METABOLIC PANEL
ALBUMIN: 4.1 g/dL (ref 3.6–5.1)
ALK PHOS: 58 U/L (ref 33–130)
ALT: 82 U/L — AB (ref 6–29)
AST: 58 U/L — AB (ref 10–35)
BILIRUBIN TOTAL: 0.5 mg/dL (ref 0.2–1.2)
BUN: 21 mg/dL (ref 7–25)
CALCIUM: 9.3 mg/dL (ref 8.6–10.4)
CO2: 26 mmol/L (ref 20–31)
Chloride: 105 mmol/L (ref 98–110)
Creat: 0.72 mg/dL (ref 0.50–0.99)
GLUCOSE: 103 mg/dL — AB (ref 65–99)
Potassium: 4.9 mmol/L (ref 3.5–5.3)
Sodium: 137 mmol/L (ref 135–146)
Total Protein: 6.6 g/dL (ref 6.1–8.1)

## 2016-01-24 LAB — POCT CBC
Granulocyte percent: 62.7 %G (ref 37–80)
HEMATOCRIT: 40.2 % (ref 37.7–47.9)
HEMOGLOBIN: 14.4 g/dL (ref 12.2–16.2)
Lymph, poc: 2.6 (ref 0.6–3.4)
MCH, POC: 30.8 pg (ref 27–31.2)
MCHC: 35.9 g/dL — AB (ref 31.8–35.4)
MCV: 85.9 fL (ref 80–97)
MID (CBC): 0.7 (ref 0–0.9)
MPV: 7.2 fL (ref 0–99.8)
POC GRANULOCYTE: 5.5 (ref 2–6.9)
POC LYMPH %: 29.5 % (ref 10–50)
POC MID %: 7.8 % (ref 0–12)
Platelet Count, POC: 298 10*3/uL (ref 142–424)
RBC: 4.68 M/uL (ref 4.04–5.48)
RDW, POC: 12.5 %
WBC: 8.7 10*3/uL (ref 4.6–10.2)

## 2016-01-24 LAB — POCT GLYCOSYLATED HEMOGLOBIN (HGB A1C): Hemoglobin A1C: 6.4

## 2016-01-24 MED ORDER — ONDANSETRON HCL 4 MG PO TABS: 4.0000 mg | ORAL_TABLET | Freq: Three times a day (TID) | ORAL | Status: DC | PRN

## 2016-01-24 NOTE — Progress Notes (Signed)
   Subjective:    Patient ID: Morgan RipaLjiljana Veron, female    DOB: [birth year], 65 y.o.   MRN: [id number]  HPI Active Problems  Chronic low back pain (724.2,338.29) (M54.5,G89.29) Chronic pain (338.29) (G89.29) Diabetes mellitus type 2, controlled (250.00) (E11.9) Diabetic neuropathy (250.60,357.2) (E11.40) Diabetic peripheral neuropathy associated with type 2 diabetes mellitus (250.60,357.2) (E11.42) Essential hypertension (401.9) (I10) Fatigue (780.79) (R53.83) Hyperlipidemia (272.4) (E78.5) Leg pain (729.5) (M79.606) Leg weakness (729.89) (M62.81) Lower extremity numbness (782.0) (R20.0) Neuropathy (355.9) (G62.9).    Review of Systems     Objective:   Physical Exam        Assessment & Plan:

## 2016-01-24 NOTE — Progress Notes (Signed)
Subjective:    Patient ID: Morgan Ruiz, female    DOB: January 19, 1951, [age] y.o.   MRN: [id number] By signing my name below, I, Littie Deedsichard Sun, attest that this documentation has been prepared under the direction and in the presence of Ellamae Siaobert Cara Aguino, MD.  Electronically Signed: Littie Deedsichard Sun, Medical Scribe. 01/24/2016. 1:58 PM.  HPI HPI Comments: Morgan Ruiz is a 65 y.o. female who presents to the [facility] complaining of loss of appetite that started about 1.5 weeks ago. Patient reports having associated abdominal pain, fatigue, mild cough, dizziness, and chills. She has had 2-3 episodes of diarrhea and 3-4 episodes of vomiting. She has been pushing fluids. Patient notes that several of her co-workers have also been ill. She has not had any difficulty sleeping.  Patient also notes having sinus problems at baseline.  Active Problems  Chronic low back pain (724.2,338.29) (M54.5,G89.29) Chronic pain (338.29) (G89.29) Diabetes mellitus type 2, controlled (250.00) (E11.9) Diabetic neuropathy (250.60,357.2) (E11.40) Diabetic peripheral neuropathy associated with type 2 diabetes mellitus (250.60,357.2) (E11.42) Essential hypertension (401.9) (I10) Fatigue (780.79) (R53.83) Hyperlipidemia (272.4) (E78.5) Leg pain (729.5) (M79.606) Leg weakness (729.89) (M62.81) Lower extremity numbness (782.0) (R20.0) Neuropathy (355.9) (G62.9).  Outpatient Encounter Prescriptions as of 01/24/2016  Medication Sig Note  . ATENOLOL PO Take by mouth.   Marland Kitchen. lisinopril (PRINIVIL) 10 MG tablet Take by mouth. 01/24/2016: Received from: [facility]  . METFORMIN HCL PO Take by mouth.   . simvastatin (ZOCOR) 10 MG tablet Take 10 mg by mouth daily.   . ondansetron (ZOFRAN) 4 MG tablet Take 1 tablet (4 mg total) by mouth every 8 (eight) hours as needed for nausea or vomiting.    No facility-administered encounter medications on file as of 01/24/2016.      Review of Systems  Constitutional:  Negative for unexpected weight change.  HENT: Negative for trouble swallowing.   Eyes: Negative for visual disturbance.  Respiratory: Negative for chest tightness and shortness of breath.   Cardiovascular: Negative for chest pain, palpitations and leg swelling.  Neurological: Negative for headaches.       Objective:   Physical Exam  Constitutional: She is oriented to person, place, and time. She appears well-developed and well-nourished. No distress.  HENT:  Head: Normocephalic and atraumatic.  Mouth/Throat: Oropharynx is clear and moist. No oropharyngeal exudate.  HEENT clear.  Eyes: Pupils are equal, round, and reactive to light.  Neck: Neck supple.  Cardiovascular: Normal rate, regular rhythm and normal heart sounds.   No murmur heard. Pulmonary/Chest: Effort normal and breath sounds normal. No respiratory distress. She has no wheezes. She has no rales.  Clear to auscultation bilaterally.   Abdominal: Soft. Bowel sounds are normal. She exhibits no distension. There is no tenderness. There is no rebound and no guarding.  Musculoskeletal: She exhibits no edema.  Neurological: She is alert and oriented to person, place, and time. No cranial nerve deficit.  Skin: Skin is warm and dry. No rash noted.  Psychiatric: She has a normal mood and affect. Her behavior is normal.  Nursing note and vitals reviewed. BP 124/60 mmHg  Pulse 68  Temp(Src) 97.7 F (36.5 C) (Oral)  Resp 12  Ht 5' 5.5" (1.664 m)  Wt 181 lb (82.101 kg)  BMI 29.65 kg/m2  SpO2 98%   CBC wnl and A1C 6.4    Assessment & Plan:  Loss of appetite - Plan:  Comprehensive metabolic panel  Other fatigue - Plan: Comprehensive metabolic panel  Sinus congestion  Diabetes  mellitus without complication (HCC) -   This seems consistent with gastrointestinal infection should be resolving but we will check metabolic profile to be sure there are not other underlying problems  I have completed the patient encounter in its  entirety as documented by the scribe, with editing by me where necessary. Imir Brumbach P. Merla Riches, M.D.   Addendum labs --Mild abnormality of liver enzymes Review of her labs from [facility] shows LDL of almost 200 9 months ago and one prior mildly abnormal LFT I will have her follow-up with her primary care

## 2016-01-24 NOTE — Patient Instructions (Signed)

## 2016-01-25 LAB — HEPATITIS C ANTIBODY: HCV Ab: NEGATIVE

## 2016-01-26 ENCOUNTER — Encounter: Payer: Self-pay | Admitting: Internal Medicine

## 2016-03-30 ENCOUNTER — Other Ambulatory Visit: Payer: Self-pay

## 2016-03-30 MED ORDER — ATENOLOL 50 MG PO TABS: 50.0000 mg | ORAL_TABLET | Freq: Every day | ORAL | Status: DC

## 2016-03-30 NOTE — Telephone Encounter (Signed)
Dr Merla Richesoolittle, pt asked pharm to send faxed request to you to refill her atenolol. You saw pt for mainly an acute issue on 3/5, but HTN is one of the Dxs for OV and this med was on her med list. Do you want to RF?

## 2016-07-13 ENCOUNTER — Ambulatory Visit (INDEPENDENT_AMBULATORY_CARE_PROVIDER_SITE_OTHER): Payer: BLUE CROSS/BLUE SHIELD | Admitting: Urgent Care

## 2016-07-13 ENCOUNTER — Encounter: Payer: Self-pay | Admitting: Urgent Care

## 2016-07-13 VITALS — BP 130/80 | HR 85 | Temp 97.9°F | Resp 17 | Ht 65.5 in | Wt 181.0 lb

## 2016-07-13 DIAGNOSIS — Z800 Family history of malignant neoplasm of digestive organs: Secondary | ICD-10-CM | POA: Diagnosis not present

## 2016-07-13 DIAGNOSIS — I10 Essential (primary) hypertension: Secondary | ICD-10-CM | POA: Diagnosis not present

## 2016-07-13 DIAGNOSIS — H00013 Hordeolum externum right eye, unspecified eyelid: Secondary | ICD-10-CM

## 2016-07-13 DIAGNOSIS — R1011 Right upper quadrant pain: Secondary | ICD-10-CM | POA: Diagnosis not present

## 2016-07-13 LAB — COMPREHENSIVE METABOLIC PANEL
ALT: 21 U/L (ref 6–29)
AST: 21 U/L (ref 10–35)
Albumin: 4.2 g/dL (ref 3.6–5.1)
Alkaline Phosphatase: 49 U/L (ref 33–130)
BUN: 24 mg/dL (ref 7–25)
CO2: 24 mmol/L (ref 20–31)
Calcium: 9.9 mg/dL (ref 8.6–10.4)
Chloride: 103 mmol/L (ref 98–110)
Creat: 0.65 mg/dL (ref 0.50–0.99)
Glucose, Bld: 100 mg/dL — ABNORMAL HIGH (ref 65–99)
Potassium: 4.7 mmol/L (ref 3.5–5.3)
Sodium: 137 mmol/L (ref 135–146)
Total Bilirubin: 0.4 mg/dL (ref 0.2–1.2)
Total Protein: 7.1 g/dL (ref 6.1–8.1)

## 2016-07-13 MED ORDER — ATENOLOL 50 MG PO TABS: 50.0000 mg | ORAL_TABLET | Freq: Every day | ORAL | 3 refills | Status: DC

## 2016-07-13 MED ORDER — CEPHALEXIN 500 MG PO CAPS: 500.0000 mg | ORAL_CAPSULE | Freq: Two times a day (BID) | ORAL | 0 refills | Status: DC

## 2016-07-13 NOTE — Progress Notes (Signed)
    MRN: [id number] DOB: 1951-11-12  Subjective:   Morgan Ruiz is a 65 y.o. female presenting for chief complaint of Medication Refill (tenormin. Pt also c/o stye on right eye x 1week)  HTN - managed with atenolol. Reports great compliance. Ranges greater than 160 with blood pressure readings at home. Admits intermittent RUQ pain, happens intermittently, pain is sharp and transient. Has had elevated LFTs in the past. Denies dizziness, chronic headache, chest pain, shortness of breath, heart racing, palpitations, nausea, vomiting, hematuria, lower leg swelling. Reports family history of liver cancer, passed from this.   Eye issue - Reports 3 day history of right eye stye. Feels her right eye is puffy, developed an itchy mass over her upper eye lid. The area is non-tender, not draining or bleeding. Denies fever.  Morgan Ruiz has a current medication list which includes the following prescription(s): atenolol. Also is allergic to vicodin [hydrocodone-acetaminophen].  Morgan Ruiz  has a past medical history of Diabetes mellitus without complication (HCC); Hyperlipidemia; and Hypertension. Also  has no past surgical history on file.  Objective:   Vitals: BP 130/80 (BP Location: Left Arm, Patient Position: Sitting, Cuff Size: Large)   Pulse 85   Temp 97.9 F (36.6 C) (Oral)   Resp 17   Ht 5' 5.5" (1.664 m)   Wt 181 lb (82.1 kg)   SpO2 96%   BMI 29.66 kg/m   Physical Exam  Constitutional: She is oriented to person, place, and time. She appears well-developed and well-nourished.  HENT:  Mouth/Throat: Oropharynx is clear and moist.  Eyes: Right eye exhibits hordeolum (upper eyelid). Right eye exhibits no discharge. Left eye exhibits no discharge. No scleral icterus.  Cardiovascular: Normal rate, regular rhythm and intact distal pulses.  Exam reveals no gallop and no friction rub.   No murmur heard. Pulmonary/Chest: No respiratory distress. She has no wheezes. She has no rales.  Abdominal:  Soft. Bowel sounds are normal. She exhibits no distension and no mass.  Musculoskeletal: She exhibits no edema.  Neurological: She is alert and oriented to person, place, and time.  Skin: Skin is warm and dry.   Assessment and Plan :   1. Essential hypertension - Stable in clinic, labs pending. Recheck in 4 weeks given patient's readings at work and home. Consider adding HCTZ.  2. Hordeolum of right upper eyelid - Use warm compresses, start Keflex  3. RUQ pain 4. Family history of liver cancer - Labs pending, RUQ U/S pending.  Wallis BambergMario Shanyn Preisler, PA-C [facility] [phone] 07/13/2016 2:02 PM

## 2016-07-13 NOTE — Patient Instructions (Addendum)
Hypertension Hypertension, commonly called high blood pressure, is when the force of blood pumping through your arteries is too strong. Your arteries are the blood vessels that carry blood from your heart throughout your body. A blood pressure reading consists of a higher number over a lower number, such as 110/72. The higher number (systolic) is the pressure inside your arteries when your heart pumps. The lower number (diastolic) is the pressure inside your arteries when your heart relaxes. Ideally you want your blood pressure below 120/80. Hypertension forces your heart to work harder to pump blood. Your arteries may become narrow or stiff. Having untreated or uncontrolled hypertension can cause heart attack, stroke, kidney disease, and other problems. RISK FACTORS Some risk factors for high blood pressure are controllable. Others are not.  Risk factors you cannot control include:   Race. You may be at higher risk if you are African American.  Age. Risk increases with age.  Gender. Men are at higher risk than women before age 45 years. After age 65, women are at higher risk than men. Risk factors you can control include:  Not getting enough exercise or physical activity.  Being overweight.  Getting too much fat, sugar, calories, or salt in your diet.  Drinking too much alcohol. SIGNS AND SYMPTOMS Hypertension does not usually cause signs or symptoms. Extremely high blood pressure (hypertensive crisis) may cause headache, anxiety, shortness of breath, and nosebleed. DIAGNOSIS To check if you have hypertension, your health care provider will measure your blood pressure while you are seated, with your arm held at the level of your heart. It should be measured at least twice using the same arm. Certain conditions can cause a difference in blood pressure between your right and left arms. A blood pressure reading that is higher than normal on one occasion does not mean that you need treatment. If  it is not clear whether you have high blood pressure, you may be asked to return on a different day to have your blood pressure checked again. Or, you may be asked to monitor your blood pressure at home for 1 or more weeks. TREATMENT Treating high blood pressure includes making lifestyle changes and possibly taking medicine. Living a healthy lifestyle can help lower high blood pressure. You may need to change some of your habits. Lifestyle changes may include:  Following the DASH diet. This diet is high in fruits, vegetables, and whole grains. It is low in salt, red meat, and added sugars.  Keep your sodium intake below 2,300 mg per day.  Getting at least 30-45 minutes of aerobic exercise at least 4 times per week.  Losing weight if necessary.  Not smoking.  Limiting alcoholic beverages.  Learning ways to reduce stress. Your health care provider may prescribe medicine if lifestyle changes are not enough to get your blood pressure under control, and if one of the following is true:  You are 18-59 years of age and your systolic blood pressure is above 140.  You are 60 years of age or older, and your systolic blood pressure is above 150.  Your diastolic blood pressure is above 90.  You have diabetes, and your systolic blood pressure is over 140 or your diastolic blood pressure is over 90.  You have kidney disease and your blood pressure is above 140/90.  You have heart disease and your blood pressure is above 140/90. Your personal target blood pressure may vary depending on your medical conditions, your age, and other factors. HOME CARE INSTRUCTIONS    Have your blood pressure rechecked as directed by your health care provider.   Take medicines only as directed by your health care provider. Follow the directions carefully. Blood pressure medicines must be taken as prescribed. The medicine does not work as well when you skip doses. Skipping doses also puts you at risk for  problems.  Do not smoke.   Monitor your blood pressure at home as directed by your health care provider. SEEK MEDICAL CARE IF:   You think you are having a reaction to medicines taken.  You have recurrent headaches or feel dizzy.  You have swelling in your ankles.  You have trouble with your vision. SEEK IMMEDIATE MEDICAL CARE IF:  You develop a severe headache or confusion.  You have unusual weakness, numbness, or feel faint.  You have severe chest or abdominal pain.  You vomit repeatedly.  You have trouble breathing. MAKE SURE YOU:   Understand these instructions.  Will watch your condition.  Will get help right away if you are not doing well or get worse.   This information is not intended to replace advice given to you by your health care provider. Make sure you discuss any questions you have with your health care provider.   Document Released: 11/07/2005 Document Revised: 03/24/2015 Document Reviewed: 08/30/2013 Elsevier Interactive Patient Education 2016 Elsevier Inc.    Stye A stye is a bump on your eyelid caused by a bacterial infection. A stye can form inside the eyelid (internal stye) or outside the eyelid (external stye). An internal stye may be caused by an infected oil-producing gland inside your eyelid. An external stye may be caused by an infection at the base of your eyelash (hair follicle). Styes are very common. Anyone can get them at any age. They usually occur in just one eye, but you may have more than one in either eye.  CAUSES  The infection is almost always caused by bacteria called Staphylococcus aureus. This is a common type of bacteria that lives on your skin. RISK FACTORS You may be at higher risk for a stye if you have had one before. You may also be at higher risk if you have:  Diabetes.  Long-term illness.  Long-term eye redness.  A skin condition called seborrhea.  High fat levels in your blood (lipids). SIGNS AND SYMPTOMS   Eyelid pain is the most common symptom of a stye. Internal styes are more painful than external styes. Other signs and symptoms may include:  Painful swelling of your eyelid.  A scratchy feeling in your eye.  Tearing and redness of your eye.  Pus draining from the stye. DIAGNOSIS  Your health care provider may be able to diagnose a stye just by examining your eye. The health care provider may also check to make sure:  You do not have a fever or other signs of a more serious infection.  The infection has not spread to other parts of your eye or areas around your eye. TREATMENT  Most styes will clear up in a few days without treatment. In some cases, you may need to use antibiotic drops or ointment to prevent infection. Your health care provider may have to drain the stye surgically if your stye is:  Large.  Causing a lot of pain.  Interfering with your vision. This can be done using a thin blade or a needle.  HOME CARE INSTRUCTIONS   Take medicines only as directed by your health care provider.  Apply a clean, warm  compress to your eye for 10 minutes, 4 times a day.  Do not wear contact lenses or eye makeup until your stye has healed.  Do not try to pop or drain the stye. SEEK MEDICAL CARE IF:  You have chills or a fever.  Your stye does not go away after several days.  Your stye affects your vision.  Your eyeball becomes swollen, red, or painful. MAKE SURE YOU:  Understand these instructions.  Will watch your condition.  Will get help right away if you are not doing well or get worse.   This information is not intended to replace advice given to you by your health care provider. Make sure you discuss any questions you have with your health care provider.   Document Released: 08/17/2005 Document Revised: 11/28/2014 Document Reviewed: 02/21/2014 Elsevier Interactive Patient Education 2016 [employer]    IF you received an x-ray today, you will receive an  invoice from [facility]. Please contact [facility] at [phone] with questions or concerns regarding your invoice.   IF you received labwork today, you will receive an invoice from [facility]. Please contact [facility] at [phone] with questions or concerns regarding your invoice.   Our billing staff will not be able to assist you with questions regarding bills from these companies.  You will be contacted with the lab results as soon as they are available. The fastest way to get your results is to activate your My Chart account. Instructions are located on the last page of this paperwork. If you have not heard from us regarding the results in 2 weeks, please contact this office.     We recommend that you schedule a mammogram for breast cancer screening. Typically, you do not need a referral to do this. Please contact a local imaging center to schedule your mammogram.  [facility] - [phone]  *ask for the Radiology Department [facility] - [phone] or [phone]) [phone]  [facility] - [phone] [facility] - [phone]) [phone] [facility] - [phone]  *ask for the Radiology Department [facility] - [phone]  *ask for the Radiology Department [facility] - [phone]) [phone]  *ask for the Mammography Department [facility] - [phone]) [phone]

## 2016-07-15 ENCOUNTER — Encounter: Payer: Self-pay | Admitting: Urgent Care

## 2016-10-07 ENCOUNTER — Ambulatory Visit (INDEPENDENT_AMBULATORY_CARE_PROVIDER_SITE_OTHER): Payer: BLUE CROSS/BLUE SHIELD | Admitting: Physician Assistant

## 2016-10-07 VITALS — BP 124/78 | HR 78 | Temp 100.0°F | Resp 17 | Ht 65.5 in | Wt 181.0 lb

## 2016-10-07 DIAGNOSIS — R509 Fever, unspecified: Secondary | ICD-10-CM | POA: Diagnosis not present

## 2016-10-07 LAB — POCT RAPID STREP A (OFFICE): Rapid Strep A Screen: NEGATIVE

## 2016-10-07 LAB — POCT INFLUENZA A/B
Influenza A, POC: NEGATIVE
Influenza B, POC: NEGATIVE

## 2016-10-07 MED ORDER — AZITHROMYCIN 250 MG PO TABS: ORAL_TABLET | ORAL | 0 refills | Status: DC

## 2016-10-07 MED ORDER — PHENYLEPHRINE-CHLORPHEN-DM 3.5-1-3 MG/ML PO LIQD: 1.0000 mL | Freq: Four times a day (QID) | ORAL | 0 refills | Status: DC | PRN

## 2016-10-07 MED ORDER — IBUPROFEN 800 MG PO TABS: 400.0000 mg | ORAL_TABLET | Freq: Three times a day (TID) | ORAL | 0 refills | Status: DC

## 2016-10-07 NOTE — Patient Instructions (Signed)
     IF you received an x-ray today, you will receive an invoice from [facility]. Please contact [facility] at [phone] with questions or concerns regarding your invoice.   IF you received labwork today, you will receive an invoice from [facility]. Please contact [facility] at [phone] with questions or concerns regarding your invoice.   Our billing staff will not be able to assist you with questions regarding bills from these companies.  You will be contacted with the lab results as soon as they are available. The fastest way to get your results is to activate your My Chart account. Instructions are located on the last page of this paperwork. If you have not heard from us regarding the results in 2 weeks, please contact this office.      

## 2016-10-07 NOTE — Progress Notes (Signed)
10/07/2016 11:46 AM   DOB: 10-07-1951 / MRN: [id number]  SUBJECTIVE:  Morgan Ruiz is a 65 y.o. female presenting for for 5-6 days of myalgia and HA. Complains that she has lost her voice as of today.  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  She has some cough. She does complain of nasal congestion, some anterior neck lymphadenopathy, mild sore throat. She smoked 40 years and smoked only 2-3 cigarettes daily.   She is allergic to vicodin [hydrocodone-acetaminophen].   She  has a past medical history of Diabetes mellitus without complication (HCC); Hyperlipidemia; and Hypertension.    She  reports that she has quit smoking. She does not have any smokeless tobacco history on file. She  has no sexual activity history on file. The patient  has no past surgical history on file.  Her family history is not on file.  Review of Systems  Constitutional: Positive for malaise/fatigue. Negative for chills, diaphoresis and fever.  HENT: Positive for congestion and sore throat.   Respiratory: Positive for cough. Negative for hemoptysis, shortness of breath and wheezing.   Cardiovascular: Negative for chest pain.  Gastrointestinal: Negative for nausea.  Skin: Negative for rash.  Neurological: Negative for dizziness and weakness.  Endo/Heme/Allergies: Negative for polydipsia.    The problem list and medications were reviewed and updated by myself where necessary and exist elsewhere in the encounter.   OBJECTIVE:  BP 124/78 (BP Location: Right Arm, Patient Position: Sitting, Cuff Size: Normal)   Pulse 78   Temp 100 F (37.8 C) (Oral)   Resp 17   Ht 5' 5.5" (1.664 m)   Wt 181 lb (82.1 kg)   SpO2 98%   BMI 29.66 kg/m   Physical Exam  Constitutional: She is oriented to person, place, and time.  HENT:  Right Ear: External ear normal.  Left Ear: External ear normal.  Nose: Mucosal edema present. Right sinus exhibits no maxillary sinus tenderness and no frontal sinus  tenderness. Left sinus exhibits no maxillary sinus tenderness and no frontal sinus tenderness.  Mouth/Throat: Oropharynx is clear and moist. No oropharyngeal exudate.  Eyes: Conjunctivae are normal. Pupils are equal, round, and reactive to light.  Cardiovascular: Regular rhythm and normal heart sounds.   Pulmonary/Chest: Effort normal and breath sounds normal. She has no rales.  Neurological: She is alert and oriented to person, place, and time.  Skin: Skin is warm and dry. No rash noted. She is not diaphoretic. No erythema.  Psychiatric: Her behavior is normal.    Lab Results  Component Value Date   HGBA1C 6.4 01/24/2016   Lab Results  Component Value Date   WBC 8.7 01/24/2016   HGB 14.4 01/24/2016   HCT 40.2 01/24/2016   MCV 85.9 01/24/2016   PLT 329 03/03/2008     Results for orders placed or performed in visit on 10/07/16 (from the past 72 hour(s))  POCT Influenza A/B     Status: None   Collection Time: 10/07/16 11:28 AM  Result Value Ref Range   Influenza A, POC Negative Negative   Influenza B, POC Negative Negative  POCT rapid strep A     Status: None   Collection Time: 10/07/16 11:28 AM  Result Value Ref Range   Rapid Strep A Screen Negative Negative    No results found.  ASSESSMENT AND PLAN  Morgan Ruiz was seen today for sore throat, fatigue and laryngitis.  Diagnoses and all orders for this visit:  Febrile illness Comments: Flu  and strep negative. This is likely viral.  Exam reassuring.  ABX only if symptoms do not improve with time or if new feve.  Orders: -     POCT Influenza A/B -     POCT rapid strep A -     Culture, Group A Strep -     chlorpheniramine-phenylephrine-dextromethorphan (CARDEC DM) 3.5-1-3 MG/ML solution; Take 1 mL by mouth every 6 (six) hours as needed for cough. -     ibuprofen (ADVIL,MOTRIN) 800 MG tablet; Take 0.5-1 tablets (400-800 mg total) by mouth 3 (three) times daily. Take with food. -     azithromycin (ZITHROMAX) 250 MG tablet;  Take 2 tabs on day one and one daily thereafter. Please allow ten days of total illness before filling.  Okay to fill if new fever.    The patient is advised to call or return to clinic if she does not see an improvement in symptoms, or to seek the care of the closest emergency department if she worsens with the above plan.   Deliah BostonMichael Suzi Hernan, MHS, PA-C [facility] 10/07/2016 11:46 AM

## 2016-10-09 LAB — CULTURE, GROUP A STREP: ORGANISM ID, BACTERIA: NORMAL

## 2016-10-10 ENCOUNTER — Ambulatory Visit (INDEPENDENT_AMBULATORY_CARE_PROVIDER_SITE_OTHER): Payer: BLUE CROSS/BLUE SHIELD | Admitting: Physician Assistant

## 2016-10-10 VITALS — BP 126/80 | HR 76 | Temp 98.2°F | Resp 16

## 2016-10-10 DIAGNOSIS — J069 Acute upper respiratory infection, unspecified: Secondary | ICD-10-CM | POA: Diagnosis not present

## 2016-10-10 LAB — POCT CBC
Granulocyte percent: 71.1 %G (ref 37–80)
HEMATOCRIT: 38.8 % (ref 37.7–47.9)
HEMOGLOBIN: 13.9 g/dL (ref 12.2–16.2)
LYMPH, POC: 2.6 (ref 0.6–3.4)
MCH, POC: 30.5 pg (ref 27–31.2)
MCHC: 35.9 g/dL — AB (ref 31.8–35.4)
MCV: 84.9 fL (ref 80–97)
MID (CBC): 0.8 (ref 0–0.9)
MPV: 7.1 fL (ref 0–99.8)
POC GRANULOCYTE: 8.5 — AB (ref 2–6.9)
POC LYMPH %: 22.1 % (ref 10–50)
POC MID %: 6.8 % (ref 0–12)
Platelet Count, POC: 321 10*3/uL (ref 142–424)
RBC: 4.57 M/uL (ref 4.04–5.48)
RDW, POC: 12.4 %
WBC: 11.9 10*3/uL — AB (ref 4.6–10.2)

## 2016-10-10 MED ORDER — AMOXICILLIN 875 MG PO TABS
875.0000 mg | ORAL_TABLET | Freq: Two times a day (BID) | ORAL | 0 refills | Status: DC
Start: 2016-10-10 — End: 2017-02-13

## 2016-10-10 MED ORDER — PHENYLEPHRINE-CHLORPHEN-DM 3.5-1-3 MG/ML PO LIQD: 1.0000 mL | Freq: Four times a day (QID) | ORAL | 0 refills | Status: DC | PRN

## 2016-10-10 NOTE — Patient Instructions (Signed)
     IF you received an x-ray today, you will receive an invoice from [facility]. Please contact [facility] at [phone] with questions or concerns regarding your invoice.   IF you received labwork today, you will receive an invoice from [facility]. Please contact [facility] at [phone] with questions or concerns regarding your invoice.   Our billing staff will not be able to assist you with questions regarding bills from these companies.  You will be contacted with the lab results as soon as they are available. The fastest way to get your results is to activate your My Chart account. Instructions are located on the last page of this paperwork. If you have not heard from us regarding the results in 2 weeks, please contact this office.      

## 2016-10-10 NOTE — Progress Notes (Signed)
LMOVM to call back. Deliah BostonMichael Chantavia Bazzle, MS, PA-C 1:11 PM, 10/10/2016

## 2016-10-10 NOTE — Progress Notes (Signed)
10/10/2016 1:49 PM   DOB: 1951/06/08 / MRN: [id number]  SUBJECTIVE:  Morgan Ruiz is a 65 y.o. female presenting for recheck of cough and febrile illness. I saw her three days ago and prescribed cough syrup along with azithromycin to start if she did not start feeling better in a few days. Flu and strep were both negative at that time. She is back today because she is feeling worse.   She is allergic to vicodin [hydrocodone-acetaminophen].   She  has a past medical history of Diabetes mellitus without complication (HCC); Hyperlipidemia; and Hypertension.    She  reports that she has quit smoking. She does not have any smokeless tobacco history on file. She  has no sexual activity history on file. The patient  has no past surgical history on file.  Her family history is not on file.  Review of Systems  Constitutional: Negative for chills and fever.  HENT: Negative for hearing loss.   Respiratory: Negative for cough.   Skin: Negative for itching and rash.  Neurological: Negative for dizziness.    The problem list and medications were reviewed and updated by myself where necessary and exist elsewhere in the encounter.   OBJECTIVE:  BP 126/80 (BP Location: Left Arm, Patient Position: Sitting, Cuff Size: Large)   Pulse 76   Temp 98.2 F (36.8 C) (Oral)   Resp 16   SpO2 98%   Physical Exam  Constitutional: She is oriented to person, place, and time.  HENT:  Right Ear: External ear normal.  Left Ear: External ear normal.  Nose: Mucosal edema present. Right sinus exhibits no maxillary sinus tenderness and no frontal sinus tenderness. Left sinus exhibits no maxillary sinus tenderness and no frontal sinus tenderness.  Mouth/Throat: Oropharynx is clear and moist. No oropharyngeal exudate.  Eyes: Conjunctivae are normal. Pupils are equal, round, and reactive to light.  Cardiovascular: Regular rhythm and normal heart sounds.   Pulmonary/Chest: Effort normal and breath sounds normal.  She has no rales.  Lymphadenopathy:       Head (right side): No submandibular and no tonsillar adenopathy present.       Head (left side): No submandibular and no tonsillar adenopathy present.    She has no cervical adenopathy.  Neurological: She is alert and oriented to person, place, and time.  Skin: Skin is warm and dry. No rash noted. She is not diaphoretic. No erythema.  Psychiatric: Her behavior is normal.    Results for orders placed or performed in visit on 10/10/16 (from the past 72 hour(s))  POCT CBC     Status: Abnormal   Collection Time: 10/10/16  1:21 PM  Result Value Ref Range   WBC 11.9 (A) 4.6 - 10.2 K/uL   Lymph, poc 2.6 0.6 - 3.4   POC LYMPH PERCENT 22.1 10 - 50 %L   MID (cbc) 0.8 0 - 0.9   POC MID % 6.8 0 - 12 %M   POC Granulocyte 8.5 (A) 2 - 6.9   Granulocyte percent 71.1 37 - 80 %G   RBC 4.57 4.04 - 5.48 M/uL   Hemoglobin 13.9 12.2 - 16.2 g/dL   HCT, POC 40.938.8 81.137.7 - 47.9 %   MCV 84.9 80 - 97 fL   MCH, POC 30.5 27 - 31.2 pg   MCHC 35.9 (A) 31.8 - 35.4 g/dL   RDW, POC 91.412.4 %   Platelet Count, POC 321 142 - 424 K/uL   MPV 7.1 0 - 99.8 fL  No results found.  ASSESSMENT AND PLAN  Morgan Ruiz was seen today for follow-up.  Diagnoses and all orders for this visit:  Acute URI Comments: She is not better and now has a granulocytosis at roughly day 10 of illness.  Stopping azithromycin and starting amox.  Orders: -     POCT CBC -     chlorpheniramine-phenylephrine-dextromethorphan (CARDEC DM) 3.5-1-3 MG/ML solution; Take 1 mL by mouth every 6 (six) hours as needed for cough. -     amoxicillin (AMOXIL) 875 MG tablet; Take 1 tablet (875 mg total) by mouth 2 (two) times daily.    The patient is advised to call or return to clinic if she does not see an improvement in symptoms, or to seek the care of the closest emergency department if she worsens with the above plan.   Deliah BostonMichael Weber Monnier, MHS, PA-C [facility] 10/10/2016 1:49 PM

## 2017-02-13 ENCOUNTER — Ambulatory Visit (INDEPENDENT_AMBULATORY_CARE_PROVIDER_SITE_OTHER): Payer: BLUE CROSS/BLUE SHIELD | Admitting: Family Medicine

## 2017-02-13 ENCOUNTER — Ambulatory Visit (INDEPENDENT_AMBULATORY_CARE_PROVIDER_SITE_OTHER): Payer: BLUE CROSS/BLUE SHIELD

## 2017-02-13 VITALS — BP 170/94 | HR 60 | Temp 97.8°F | Resp 16 | Ht 65.0 in | Wt 182.6 lb

## 2017-02-13 DIAGNOSIS — M25532 Pain in left wrist: Secondary | ICD-10-CM | POA: Diagnosis not present

## 2017-02-13 DIAGNOSIS — I10 Essential (primary) hypertension: Secondary | ICD-10-CM | POA: Diagnosis not present

## 2017-02-13 DIAGNOSIS — G8929 Other chronic pain: Secondary | ICD-10-CM

## 2017-02-13 DIAGNOSIS — M25512 Pain in left shoulder: Secondary | ICD-10-CM

## 2017-02-13 DIAGNOSIS — M25531 Pain in right wrist: Secondary | ICD-10-CM

## 2017-02-13 DIAGNOSIS — G5603 Carpal tunnel syndrome, bilateral upper limbs: Secondary | ICD-10-CM

## 2017-02-13 IMAGING — DX DG SHOULDER 2+V*L*
3 series · 3 of 3 positions shown · non-contrast
Comparison: None.

CLINICAL DATA: Chronic left shoulder pain.

EXAM:
LEFT SHOULDER - 2+ VIEW

[shoulder ap]
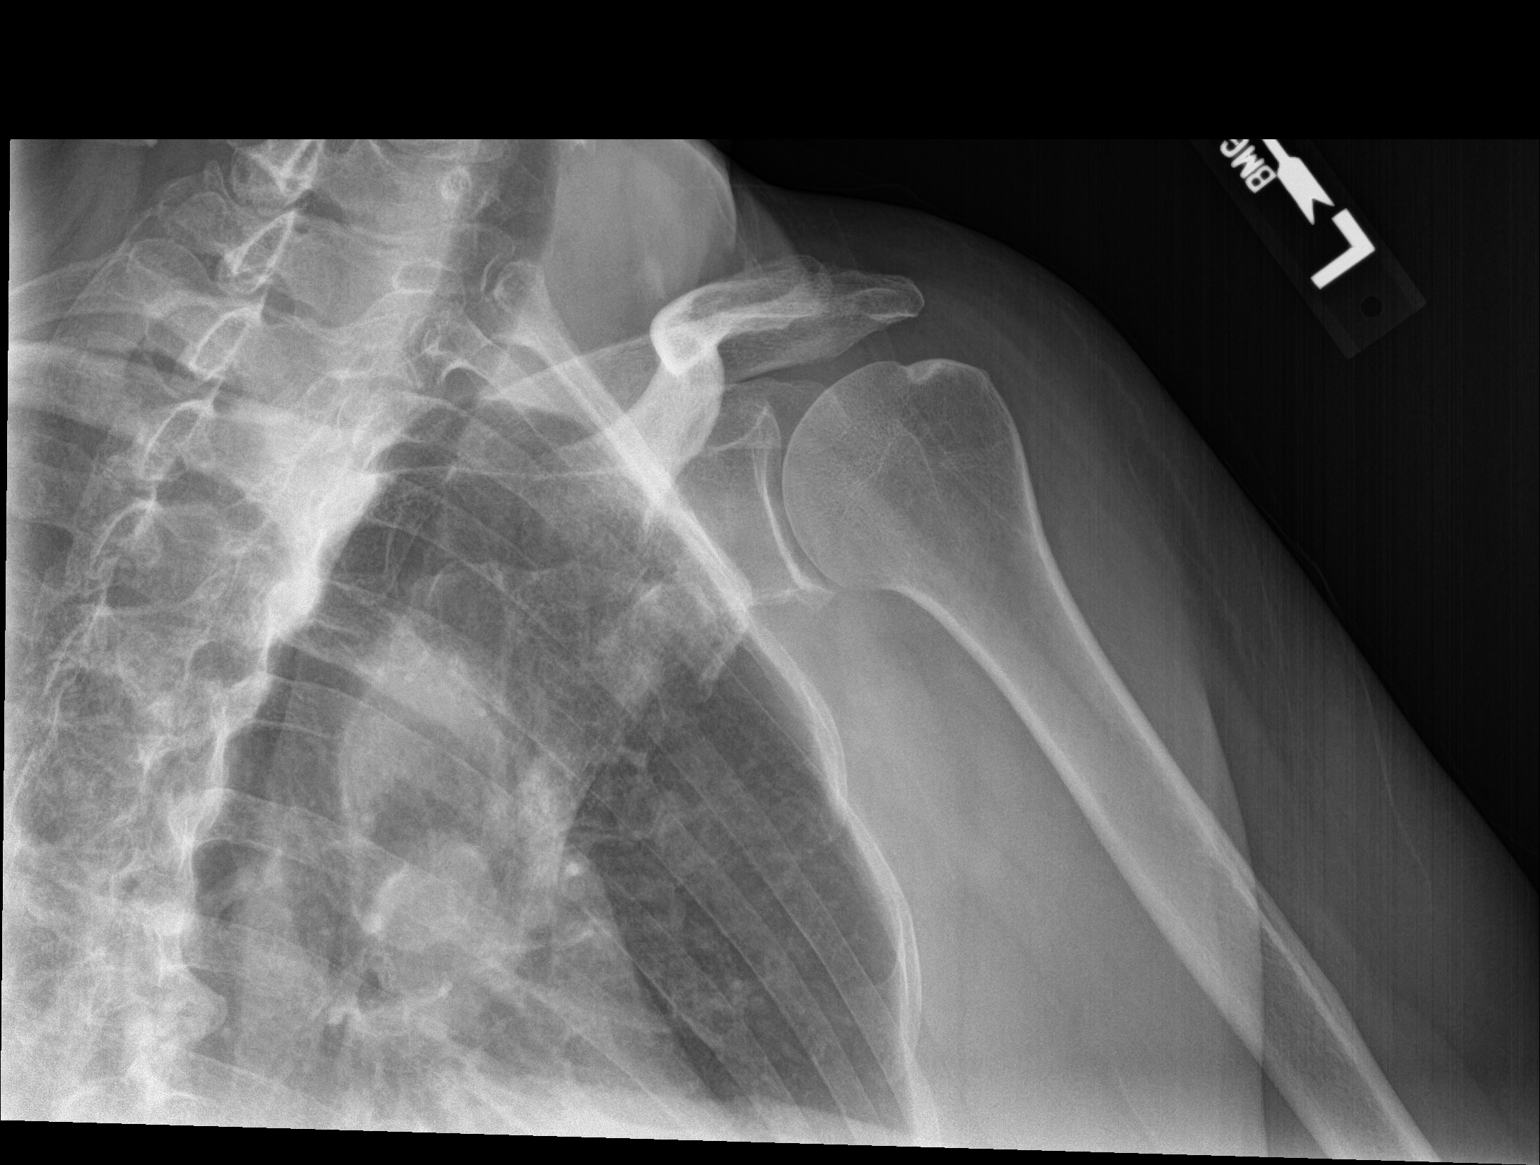

[shoulder y-view]
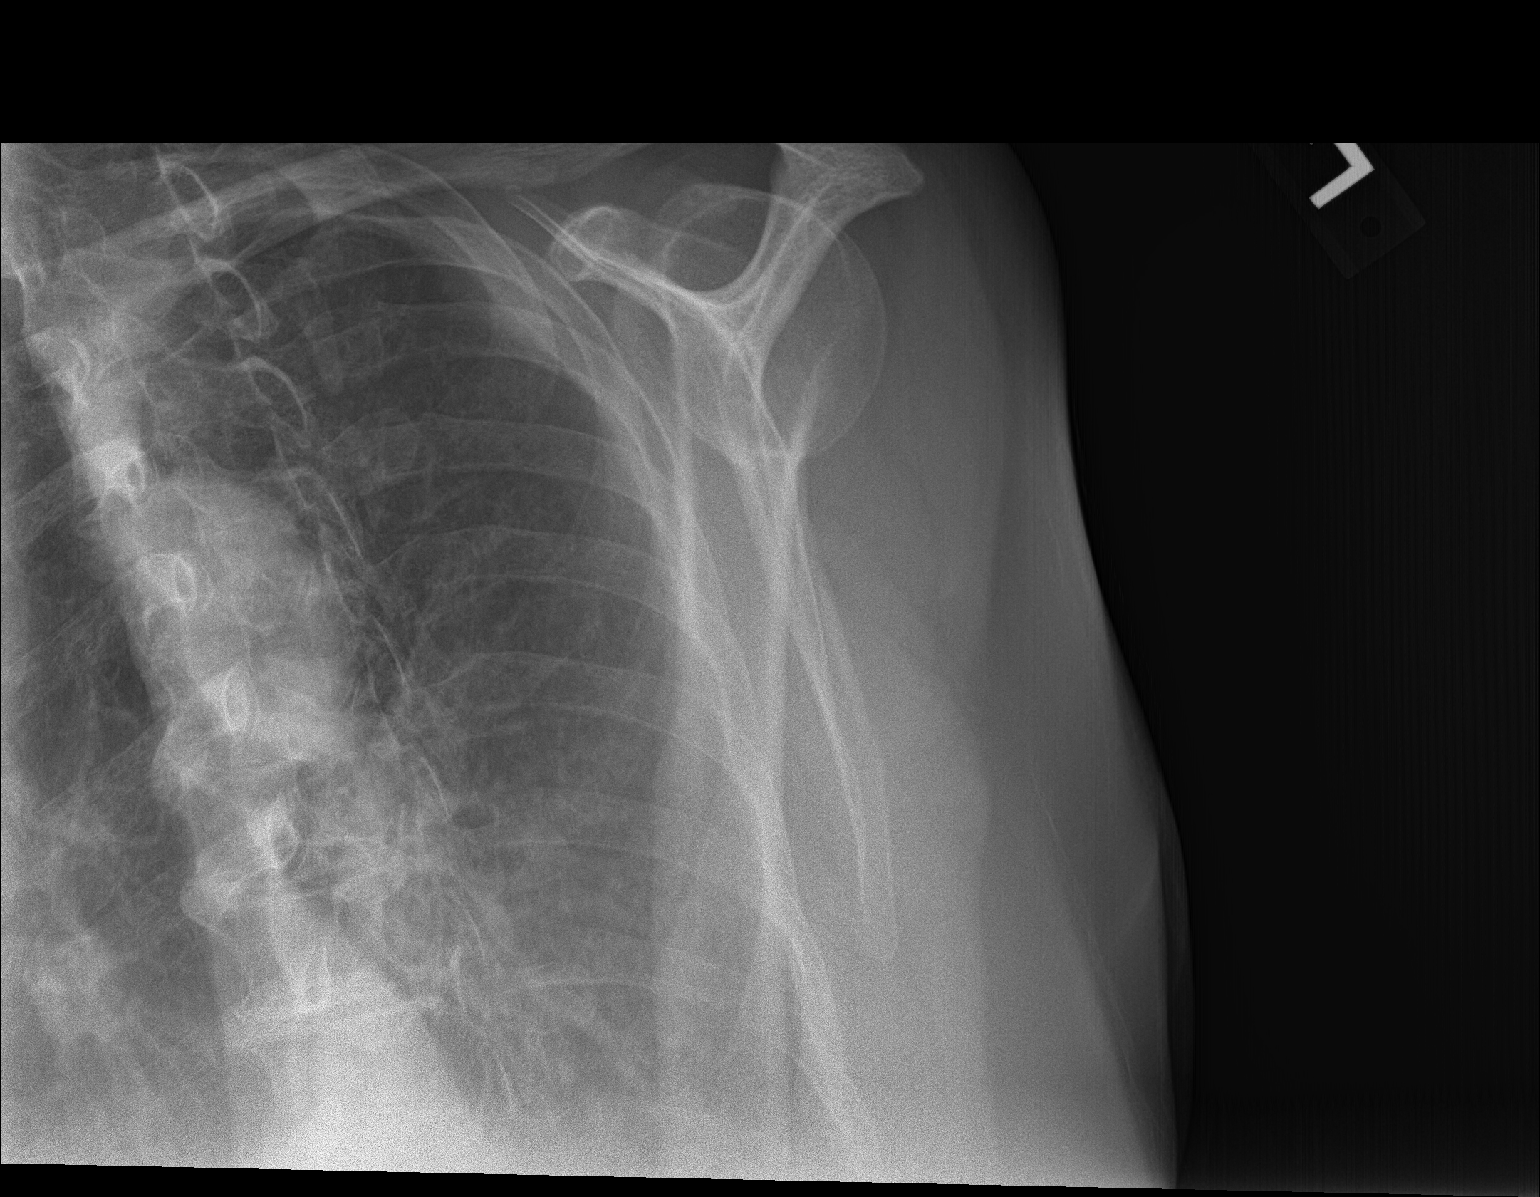

[shoulder axial]
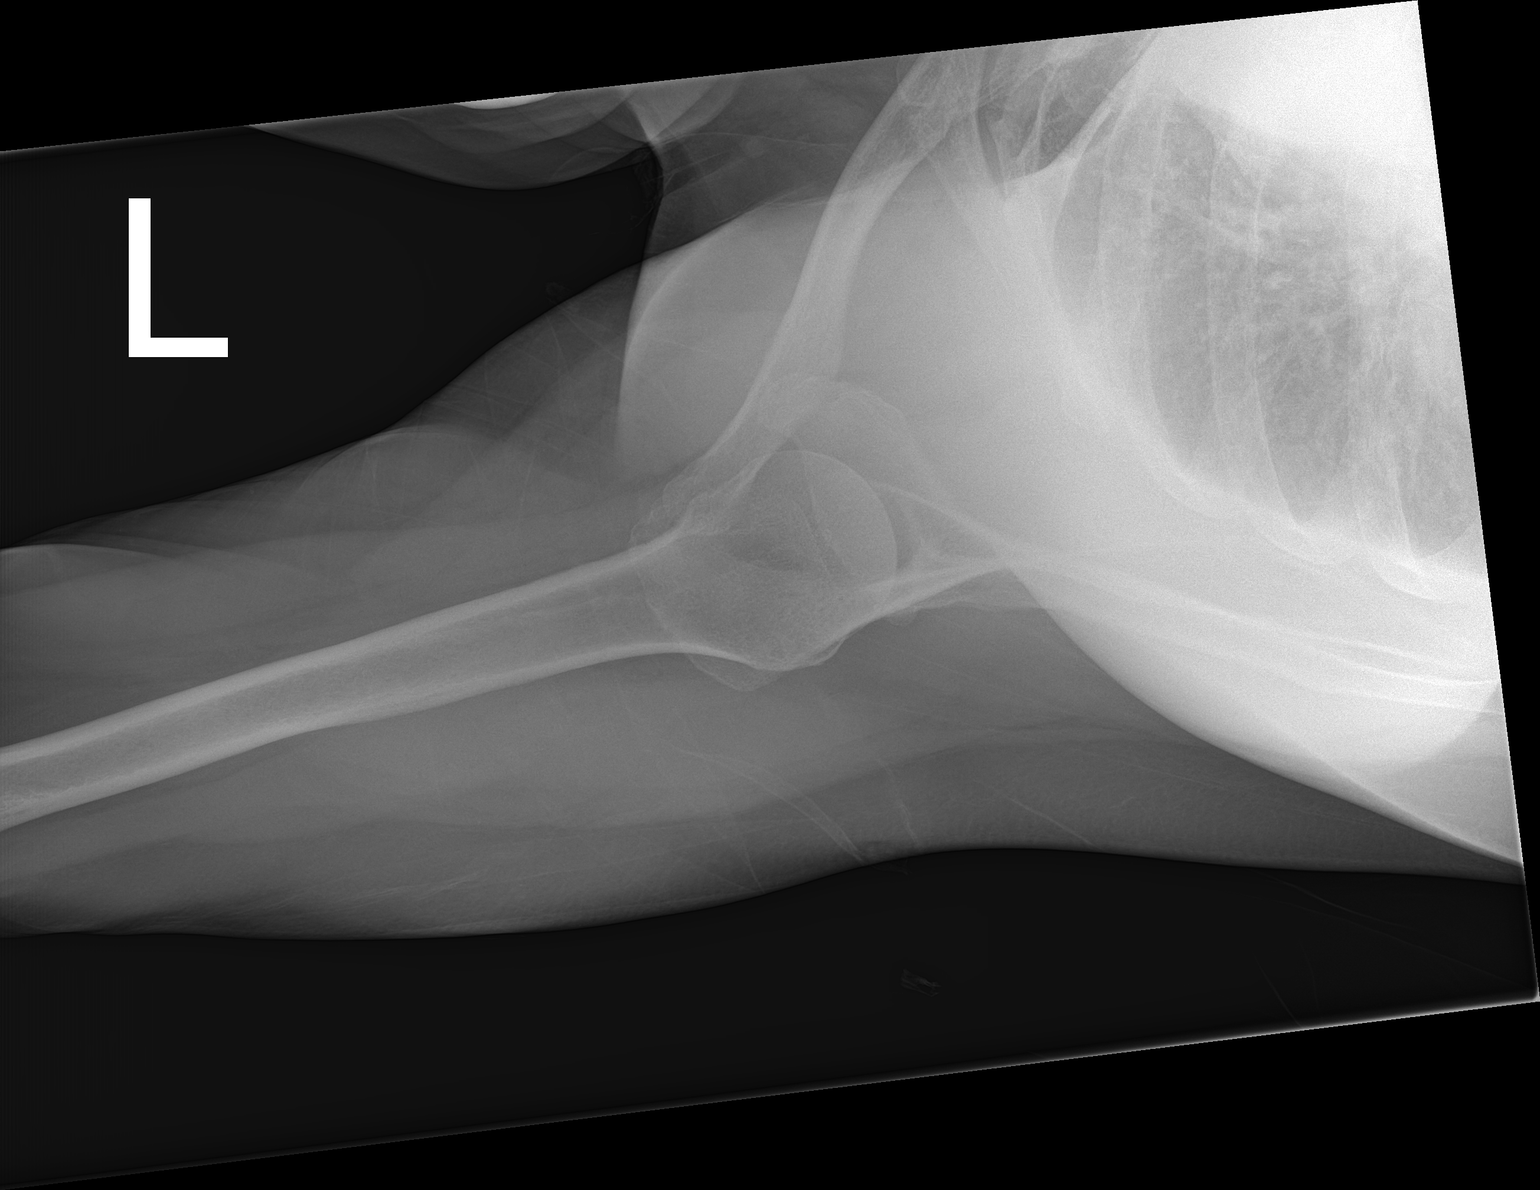

[3 of 3 positions shown; findings below may reference images not displayed]

FINDINGS: There is no evidence of fracture or dislocation. There is no
evidence of arthropathy or other focal bone abnormality. Soft
tissues are unremarkable.
IMPRESSION: No acute osseous injury of the left shoulder.

## 2017-02-13 IMAGING — DX DG WRIST COMPLETE 3+V*L*
4 series · 4 of 4 positions shown · non-contrast
Comparison: None.

CLINICAL DATA: Left wrist pain, chronic pain.

EXAM:
LEFT WRIST - COMPLETE 3+ VIEW

[wrist pa]
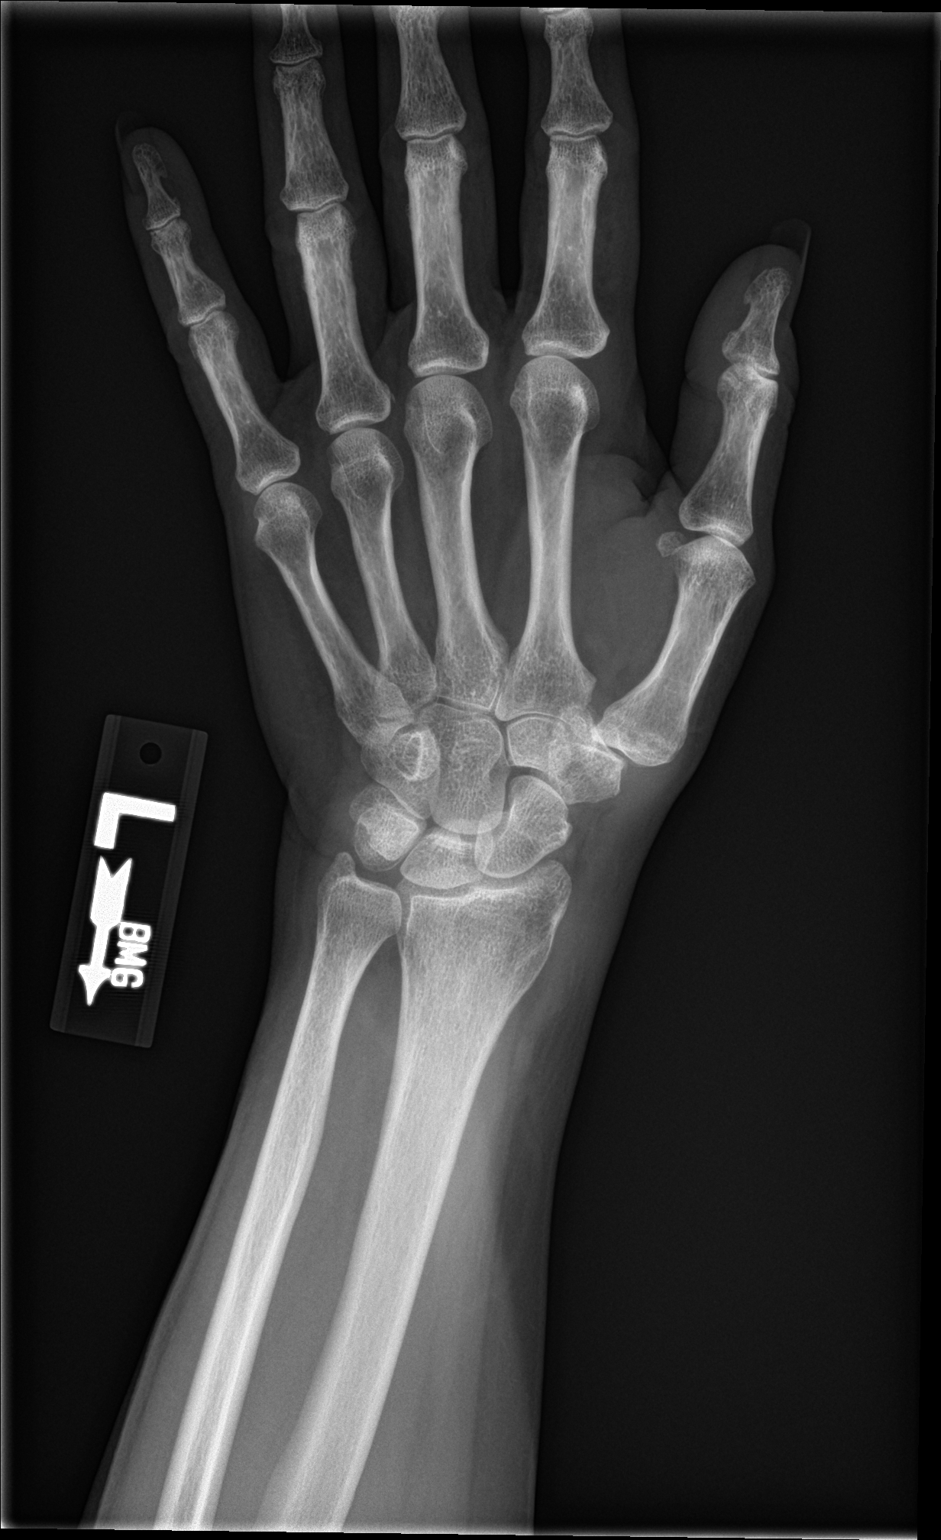

[wrist obl]
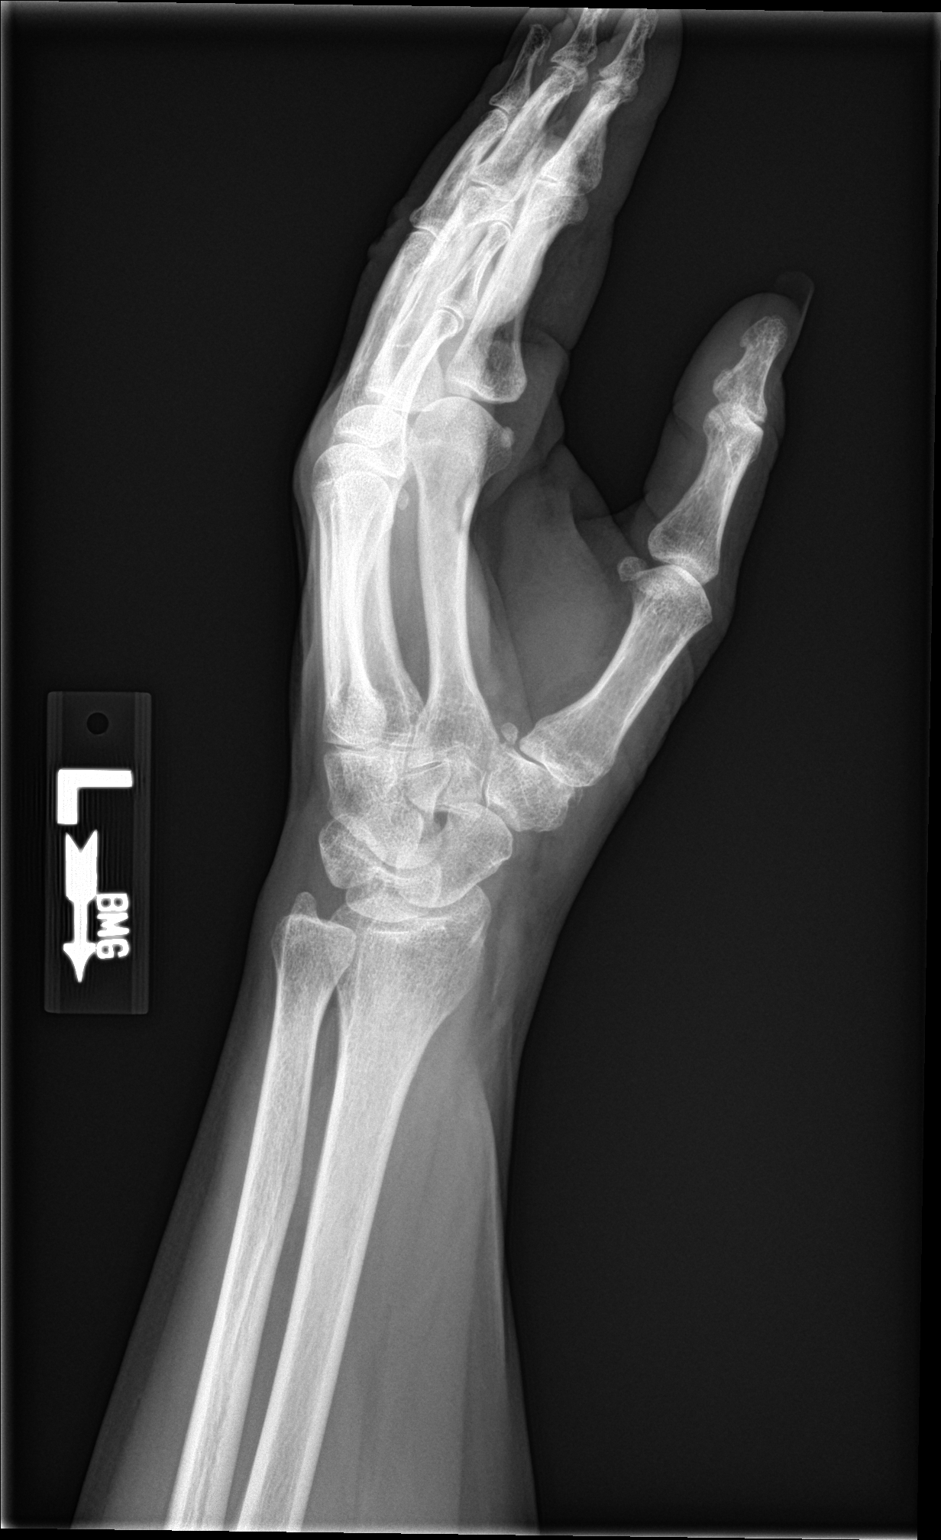

[wrist lat]
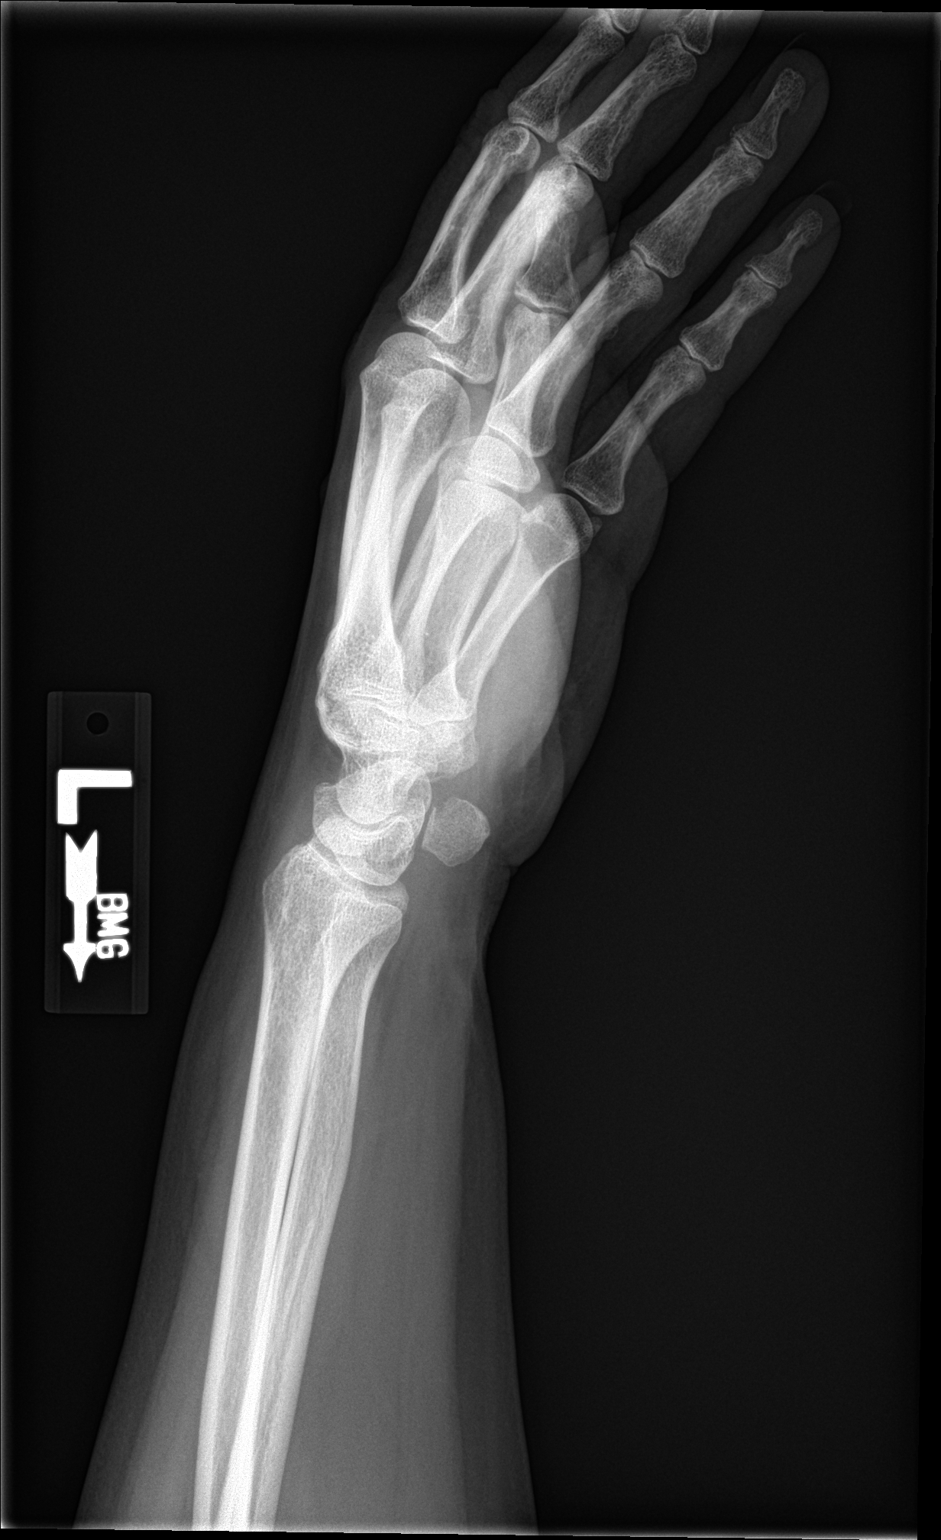

[wrist navicular]
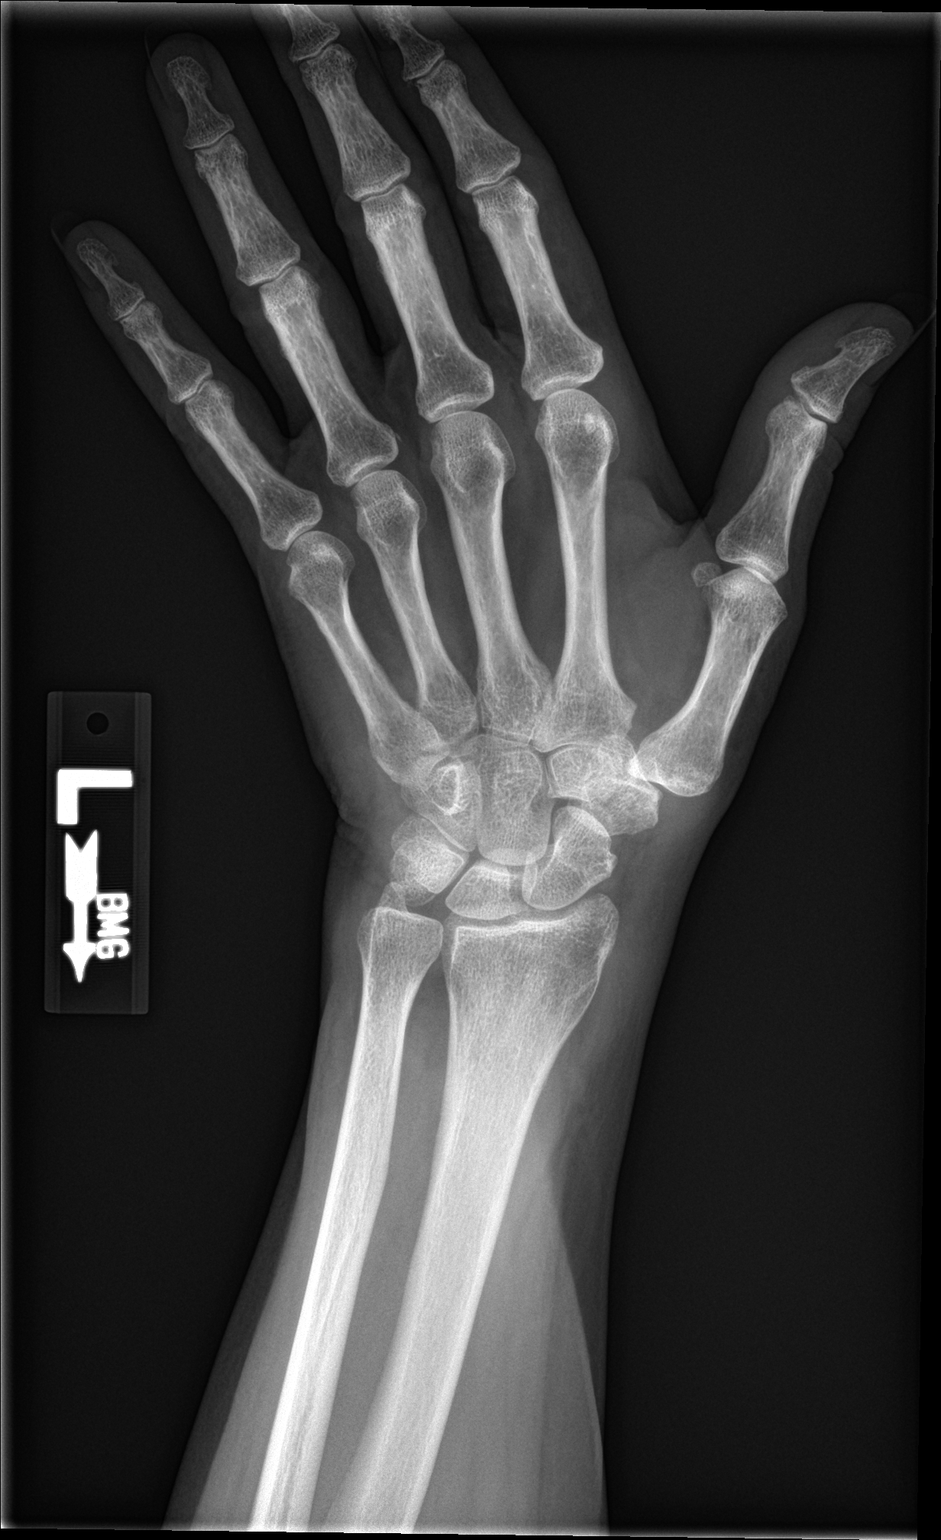

[4 of 4 positions shown; findings below may reference images not displayed]

FINDINGS: There is no evidence of fracture or dislocation. There is mild
osteoarthritis of the first CMC joint. Soft tissues are
unremarkable.
IMPRESSION: No acute osseous injury of the left wrist.

## 2017-02-13 IMAGING — DX DG WRIST COMPLETE 3+V*R*
4 series · 4 of 4 positions shown · non-contrast
Comparison: None.

CLINICAL DATA: Chronic left wrist pain common thumb pain and
decreased grip strength

EXAM:
RIGHT WRIST - COMPLETE 3+ VIEW

[wrist pa]
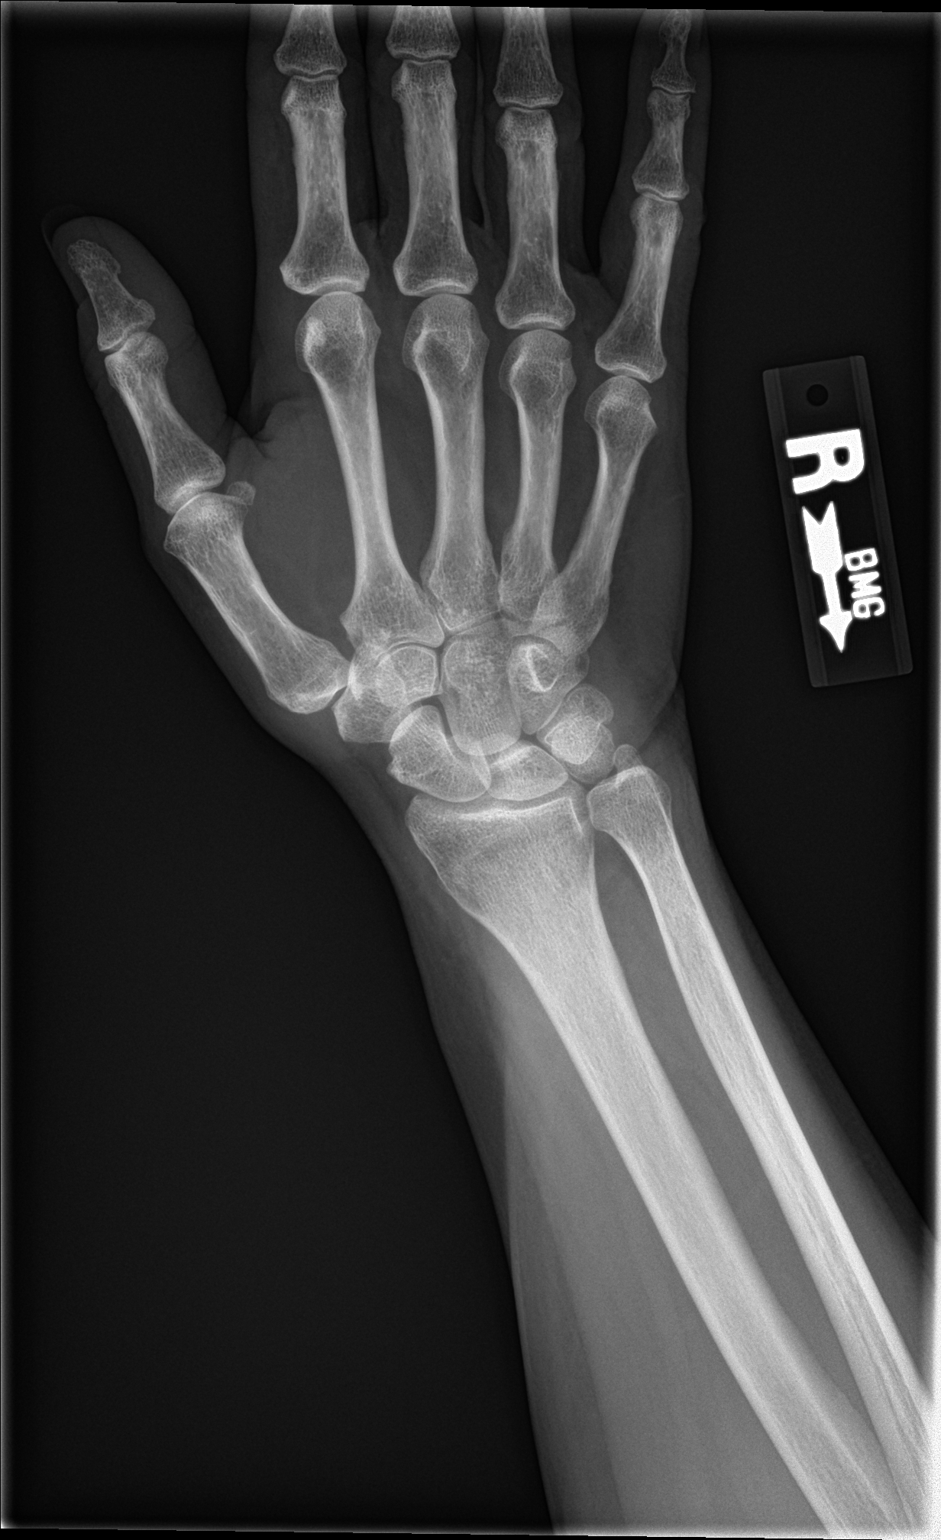

[wrist obl]
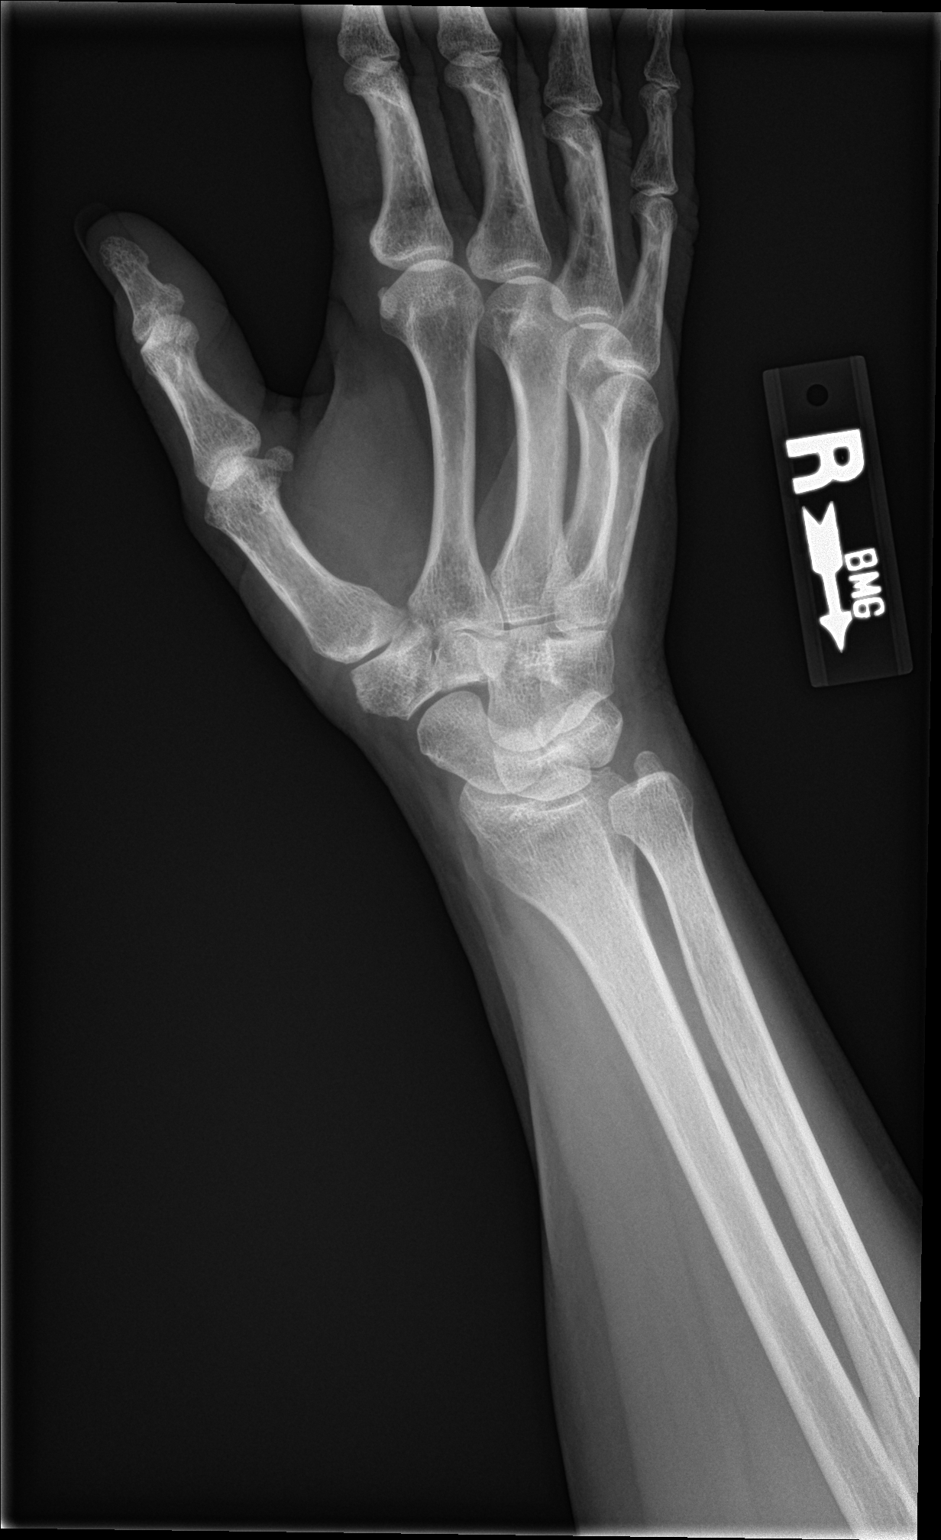

[wrist lat]
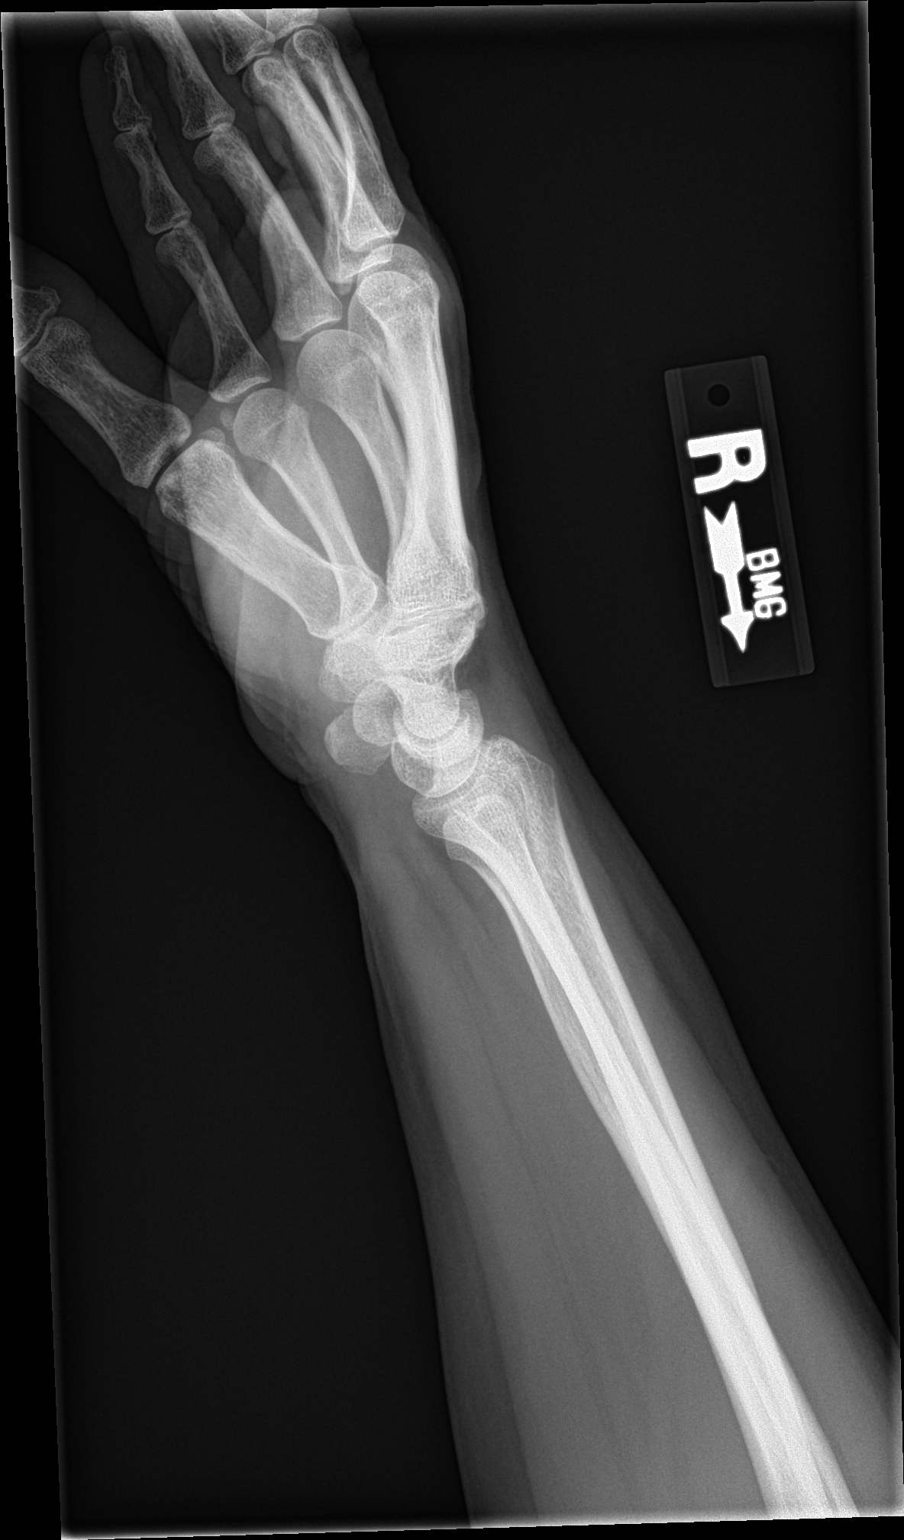

[wrist navicular]
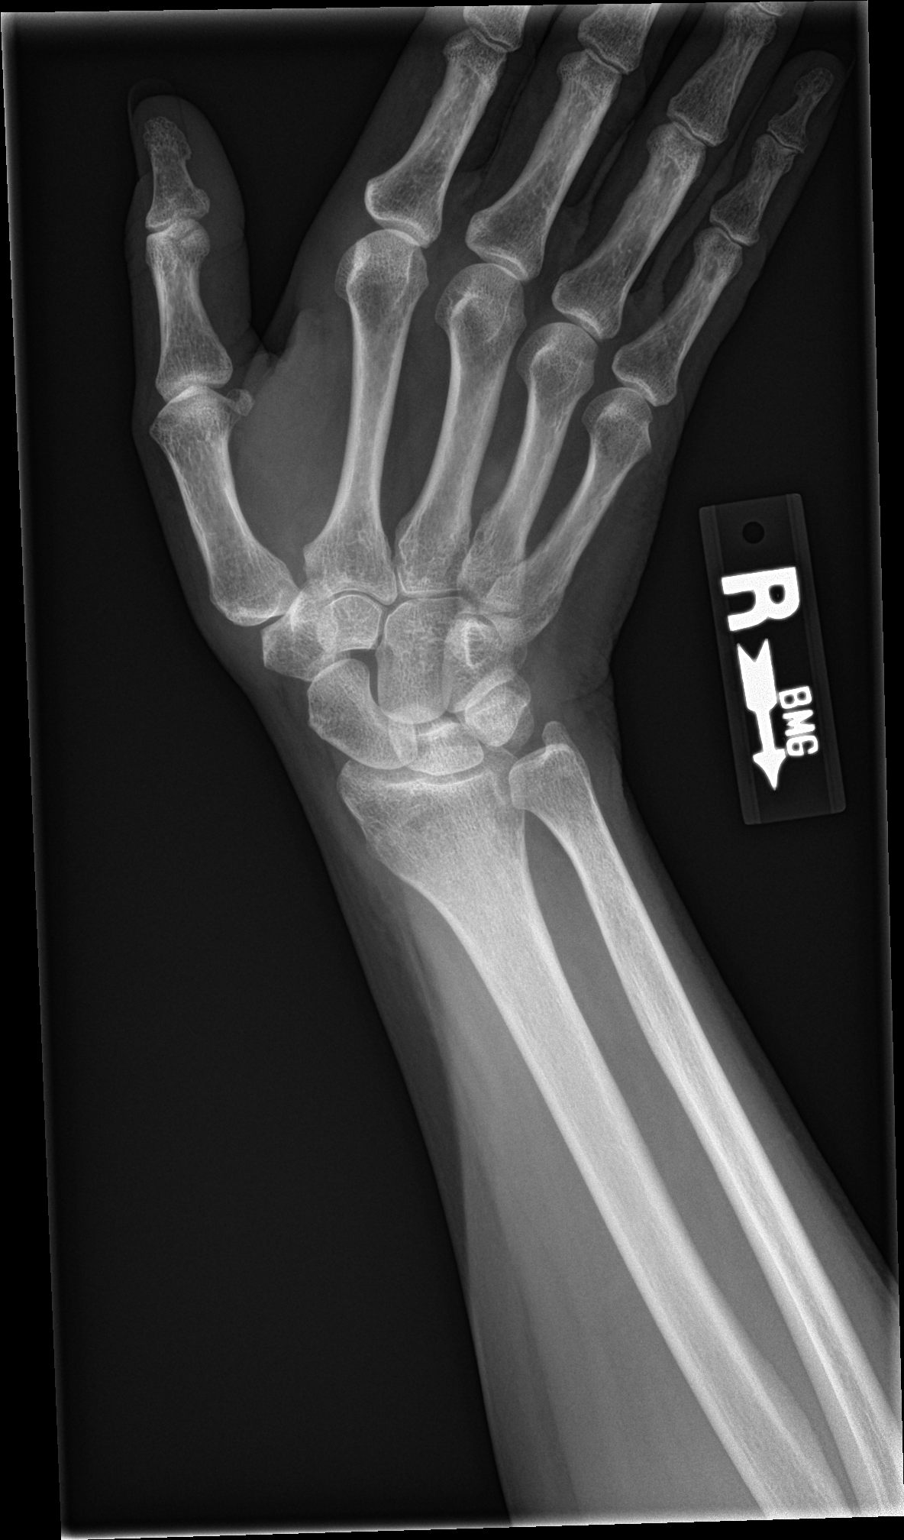

[4 of 4 positions shown; findings below may reference images not displayed]

FINDINGS: The distal radius and ulna are unremarkable. Carpal bones are
maintained and aligned without acute fracture nor bone destruction.
Tiny focus of osteopenia along the ulnar aspect of the hamate. No
abnormal soft tissue mass or mineralization. The visualized
metacarpophalangeal, PIP and interphalangeal joints of the right
hand are unremarkable.
IMPRESSION: No acute osseous abnormality of the right wrist and visualized hand.

## 2017-02-13 MED ORDER — TRAMADOL HCL 50 MG PO TABS: 50.0000 mg | ORAL_TABLET | Freq: Three times a day (TID) | ORAL | 0 refills | Status: DC | PRN

## 2017-02-13 MED ORDER — IBUPROFEN 600 MG PO TABS: 600.0000 mg | ORAL_TABLET | Freq: Four times a day (QID) | ORAL | 0 refills | Status: DC | PRN

## 2017-02-13 MED ORDER — VERAPAMIL HCL ER 120 MG PO TBCR: 120.0000 mg | EXTENDED_RELEASE_TABLET | Freq: Every day | ORAL | 3 refills | Status: DC

## 2017-02-13 NOTE — Progress Notes (Signed)
Chief Complaint  Patient presents with  . 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 She states that she was helping a patient to the shower and noted sharp pain in the left hand for one second For the past two months she has progressive pain radiating down the arm progressively  Right thumb pain She reports that the right thumb has been painful and crunchy but this was chronic for 2 months In the past 2 weeks she has been having pain in her left hand and this is causing her to have difficulty with opening a bottle and even turning a door knob The pain is very sharp At the worst the pain is 9/10 The pain is all the time but night is worse She tried ibuprofen otc and creams and they did not help it.  Uncontrolled hypertension She reports that she has been taking atenolol but her blood pressure is uncontrolled when she checks it at work.  She reports that she has been taking her blood pressure meds daily and took it today.  She states that at work her bp was even as high as 180s. She denies chest pains, shortness of breath and palpitations No lower extremity edema  BP Readings from Last 3 Encounters:  02/13/17 (!) 170/94  10/10/16 126/80  10/07/16 124/78    Past Medical History:  Diagnosis Date  . Diabetes mellitus without complication (HCC)   . Hyperlipidemia   . Hypertension     Current Outpatient Prescriptions  Medication Sig Dispense Refill  . atenolol (TENORMIN) 50 MG tablet Take 1 tablet (50 mg total) by mouth at bedtime. 90 tablet 3  . verapamil (CALAN-SR) 120 MG CR tablet Take 1 tablet (120 mg total) by mouth daily. 30 tablet 3   No current facility-administered medications for this visit.     Allergies:  Allergies  Allergen Reactions  . Vicodin [Hydrocodone-Acetaminophen] Rash    No past surgical history on file.  Social History   Social History  .  Marital status: Married    Spouse name: N/A  . Number of children: N/A  . Years of education: N/A   Social History Main Topics  . Smoking status: Former Games developermoker  . Smokeless tobacco: Never Used  . Alcohol use None  . Drug use: Unknown  . Sexual activity: Not Asked   Other Topics Concern  . None   Social History Narrative  . None    ROS See hpi  Objective: Vitals:   02/13/17 1428 02/13/17 1446  BP: (!) 170/102 (!) 170/94  Pulse: 60   Resp: 16   Temp: 97.8 F (36.6 C)   TempSrc: Oral   SpO2: 95%   Weight: 182 lb 9.6 oz (82.8 kg)   Height: 5\' 5"  (1.651 m)     Physical Exam  Constitutional: She is oriented to person, place, and time. She appears well-developed and well-nourished.  HENT:  Head: Normocephalic and atraumatic.  Right Ear: External ear normal.  Left Ear: External ear normal.  Eyes: Conjunctivae and EOM are normal.  Neck: Normal range of motion. Neck supple.  Cardiovascular: Normal rate, regular rhythm, normal heart sounds and intact distal pulses.   No murmur heard. Pulmonary/Chest: Effort normal and breath sounds normal. No respiratory distress. She has no wheezes. She has no rales. She exhibits no tenderness.  Abdominal: Soft. Bowel sounds  are normal. She exhibits no distension and no mass. There is no tenderness. There is no rebound and no guarding. No hernia.  Musculoskeletal:       Left shoulder: She exhibits decreased range of motion, crepitus and spasm. She exhibits no tenderness, no bony tenderness, no swelling, no deformity, no laceration and no pain.       Right hand: She exhibits decreased range of motion. She exhibits no tenderness, no bony tenderness, normal two-point discrimination, normal capillary refill, no deformity, no laceration and no swelling. Normal sensation noted. Decreased strength noted. She exhibits thumb/finger opposition and wrist extension trouble.       Left hand: She exhibits decreased range of motion and tenderness. She  exhibits normal capillary refill. Normal sensation noted. Decreased strength noted. She exhibits finger abduction and wrist extension trouble.  Neurological: She is alert and oriented to person, place, and time. No cranial nerve deficit.  Skin: Skin is warm. Capillary refill takes less than 2 seconds.  Psychiatric: She has a normal mood and affect. Her behavior is normal. Judgment and thought content normal.    tinnels And phalens positive Reduced grip strength bilateral  Shoulder pain with range of motion Worse with external rotatoin  Lab Results  Component Value Date   WBC 6.9 02/13/2017   HGB 13.9 10/10/2016   HCT 40.2 02/13/2017   MCV 87 02/13/2017   PLT 336 02/13/2017   Lab Results  Component Value Date   CREATININE 0.70 02/13/2017     COMPARISON:  None.  FINDINGS: There is no evidence of fracture or dislocation. There is no evidence of arthropathy or other focal bone abnormality. Soft tissues are unremarkable.  IMPRESSION: No acute osseous injury of the left shoulder.   Electronically Signed   By: Elige Ko   On: 02/13/2017 15:35   COMPARISON:  None.  FINDINGS: There is no evidence of fracture or dislocation. There is mild osteoarthritis of the first CMC joint. Soft tissues are unremarkable.  IMPRESSION: No acute osseous injury of the left wrist.   Electronically Signed   By: Elige Ko   On: 02/13/2017 15:46  COMPARISON:  None.  FINDINGS: The distal radius and ulna are unremarkable. Carpal bones are maintained and aligned without acute fracture nor bone destruction. Tiny focus of osteopenia along the ulnar aspect of the hamate. No abnormal soft tissue mass or mineralization. The visualized metacarpophalangeal, PIP and interphalangeal joints of the right hand are unremarkable.  IMPRESSION: No acute osseous abnormality of the right wrist and visualized hand.   Electronically Signed   By: Tollie Eth M.D.   On: 02/13/2017  15:39   Assessment and Plan Jayde was seen today for arm pain.  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   A total of 40 minutes were spent face-to-face with the patient during this encounter and over half of that time was spent on counseling and coordination of care.   Donal Lynam A Anant Agard

## 2017-02-13 NOTE — Patient Instructions (Addendum)
   IF you received an x-ray today, you will receive an invoice from [facility]. Please contact [facility] at [phone] with questions or concerns regarding your invoice.   IF you received labwork today, you will receive an invoice from [facility]. Please contact [facility] at [phone] with questions or concerns regarding your invoice.   Our billing staff will not be able to assist you with questions regarding bills from these companies.  You will be contacted with the lab results as soon as they are available. The fastest way to get your results is to activate your My Chart account. Instructions are located on the last page of this paperwork. If you have not heard from us regarding the results in 2 weeks, please contact this office.      Carpal Tunnel Syndrome Carpal tunnel syndrome is a condition that causes pain in your hand and arm. The carpal tunnel is a narrow area located on the palm side of your wrist. Repeated wrist motion or certain diseases may cause swelling within the tunnel. This swelling pinches the main nerve in the wrist (median nerve). What are the causes? This condition may be caused by:  Repeated wrist motions.  Wrist injuries.  Arthritis.  A cyst or tumor in the carpal tunnel.  Fluid buildup during pregnancy.  Sometimes the cause of this condition is not known. What increases the risk? This condition is more likely to develop in:  People who have jobs that cause them to repeatedly move their wrists in the same motion, such as butchers and cashiers.  Women.  People with certain conditions, such as: ? Diabetes. ? Obesity. ? An underactive thyroid (hypothyroidism). ? Kidney failure.  What are the signs or symptoms? Symptoms of this condition include:  A tingling feeling in your fingers, especially in your thumb, index, and middle fingers.  Tingling or numbness in your hand.  An aching feeling in your entire arm, especially  when your wrist and elbow are bent for long periods of time.  Wrist pain that goes up your arm to your shoulder.  Pain that goes down into your palm or fingers.  A weak feeling in your hands. You may have trouble grabbing and holding items.  Your symptoms may feel worse during the night. How is this diagnosed? This condition is diagnosed with a medical history and physical exam. You may also have tests, including:  An electromyogram (EMG). This test measures electrical signals sent by your nerves into the muscles.  X-rays.  How is this treated? Treatment for this condition includes:  Lifestyle changes. It is important to stop doing or modify the activity that caused your condition.  Physical or occupational therapy.  Medicines for pain and inflammation. This may include medicine that is injected into your wrist.  A wrist splint.  Surgery.  Follow these instructions at home: If you have a splint:  Wear it as told by your health care provider. Remove it only as told by your health care provider.  Loosen the splint if your fingers become numb and tingle, or if they turn cold and blue.  Keep the splint clean and dry. General instructions  Take over-the-counter and prescription medicines only as told by your health care provider.  Rest your wrist from any activity that may be causing your pain. If your condition is work related, talk to your employer about changes that can be made, such as getting a wrist pad to use while typing.  If directed, apply ice to   the painful area: ? Put ice in a plastic bag. ? Place a towel between your skin and the bag. ? Leave the ice on for 20 minutes, 2-3 times per day.  Keep all follow-up visits as told by your health care provider. This is important.  Do any exercises as told by your health care provider, physical therapist, or occupational therapist. Contact a health care provider if:  You have new symptoms.  Your pain is not  controlled with medicines.  Your symptoms get worse. This information is not intended to replace advice given to you by your health care provider. Make sure you discuss any questions you have with your health care provider. Document Released: 11/04/2000 Document Revised: 03/17/2016 Document Reviewed: 03/25/2015 Elsevier Interactive Patient Education  2017 Elsevier Inc.  

## 2017-02-14 LAB — BASIC METABOLIC PANEL
BUN/Creatinine Ratio: 29 — ABNORMAL HIGH (ref 12–28)
BUN: 20 mg/dL (ref 8–27)
CHLORIDE: 103 mmol/L (ref 96–106)
CO2: 24 mmol/L (ref 18–29)
CREATININE: 0.7 mg/dL (ref 0.57–1.00)
Calcium: 9.9 mg/dL (ref 8.7–10.3)
GFR, EST AFRICAN AMERICAN: 107 mL/min/{1.73_m2} (ref >59)
GFR, EST NON AFRICAN AMERICAN: 93 mL/min/{1.73_m2} (ref >59)
Glucose: 87 mg/dL (ref 65–99)
POTASSIUM: 5.1 mmol/L (ref 3.5–5.2)
SODIUM: 142 mmol/L (ref 134–144)

## 2017-02-14 LAB — CBC WITH DIFFERENTIAL/PLATELET
BASOS ABS: 0.1 10*3/uL (ref 0.0–0.2)
Basos: 1 %
EOS (ABSOLUTE): 0.2 10*3/uL (ref 0.0–0.4)
EOS: 3 %
Hematocrit: 40.2 % (ref 34.0–46.6)
Hemoglobin: 13.5 g/dL (ref 11.1–15.9)
IMMATURE GRANULOCYTES: 0 %
Immature Grans (Abs): 0 10*3/uL (ref 0.0–0.1)
Lymphocytes Absolute: 3.1 10*3/uL (ref 0.7–3.1)
Lymphs: 45 %
MCH: 29.2 pg (ref 26.6–33.0)
MCHC: 33.6 g/dL (ref 31.5–35.7)
MCV: 87 fL (ref 79–97)
MONOS ABS: 0.4 10*3/uL (ref 0.1–0.9)
Monocytes: 6 %
NEUTROS PCT: 45 %
Neutrophils Absolute: 3.1 10*3/uL (ref 1.4–7.0)
PLATELETS: 336 10*3/uL (ref 150–379)
RBC: 4.62 x10E6/uL (ref 3.77–5.28)
RDW: 13.2 % (ref 12.3–15.4)
WBC: 6.9 10*3/uL (ref 3.4–10.8)

## 2017-03-07 ENCOUNTER — Ambulatory Visit (INDEPENDENT_AMBULATORY_CARE_PROVIDER_SITE_OTHER): Payer: BLUE CROSS/BLUE SHIELD | Admitting: Orthopaedic Surgery

## 2017-03-07 ENCOUNTER — Encounter (INDEPENDENT_AMBULATORY_CARE_PROVIDER_SITE_OTHER): Payer: Self-pay | Admitting: Orthopaedic Surgery

## 2017-03-07 DIAGNOSIS — G5602 Carpal tunnel syndrome, left upper limb: Secondary | ICD-10-CM

## 2017-03-07 DIAGNOSIS — G5601 Carpal tunnel syndrome, right upper limb: Secondary | ICD-10-CM

## 2017-03-07 NOTE — Progress Notes (Signed)
   Office Visit Note   Patient: Morgan Ruiz           Date of Birth: [birth year]           MRN: [id number] Visit Date: 03/07/2017              Requested [place]: [place] [address] [place], [place] [address] PCP: No PCP Per Patient   Assessment & Plan: Visit Diagnoses:  1. Right carpal tunnel syndrome   2. Left carpal tunnel syndrome     Plan: Clinically she has carpal tunnel syndrome. I think this is mild. We will start with nighttime bracing and daytime as needed. Questions encouraged and answered. Follow-up with me as needed.  Follow-Up Instructions: Return if symptoms worsen or fail to improve.   Orders:  No orders of the defined types were placed in this encounter.  No orders of the defined types were placed in this encounter.     Procedures: No procedures performed   Clinical Data: No additional findings.   Subjective: No chief complaint on file.   Patient is a 66 year old female who works at [employer] comes in with bilateral carpal tunnel syndrome. She has numbness and tingling in her fingers. She is not interested in any sort of invasive procedures. She also has a right trigger thumb. This does not bother her. She complains of numbness mainly in her fingers. She occasionally will drop objects.    Review of Systems  Constitutional: Negative.   HENT: Negative.   Eyes: Negative.   Respiratory: Negative.   Cardiovascular: Negative.   Endocrine: Negative.   Musculoskeletal: Negative.   Neurological: Negative.   Hematological: Negative.   Psychiatric/Behavioral: Negative.   All other systems reviewed and are negative.    Objective: Vital Signs: There were no vitals taken for this visit.  Physical Exam  Constitutional: She is oriented to person, place, and time. She appears well-developed and well-nourished.  HENT:  Head: Normocephalic and atraumatic.  Eyes: EOM are normal.  Neck: Neck supple.  Pulmonary/Chest: Effort normal.  Abdominal:  Soft.  Neurological: She is alert and oriented to person, place, and time.  Skin: Skin is warm. Capillary refill takes less than 2 seconds.  Psychiatric: She has a normal mood and affect. Her behavior is normal. Judgment and thought content normal.  Nursing note and vitals reviewed.   Ortho Exam Bilateral hand exam shows no muscular atrophy. She has negative carpal tunnel compression signs. She endorses numbness in her index through little fingers bilaterally. This is worse on the right hand. Specialty Comments:  No specialty comments available.  Imaging: No results found.   PMFS History: Patient Active Problem List   Diagnosis Date Noted  . Diabetes mellitus without complication (HCC) 01/24/2016  . Hypertension 01/24/2016  . Hyperlipidemia 01/24/2016   Past Medical History:  Diagnosis Date  . Diabetes mellitus without complication (HCC)   . Hyperlipidemia   . Hypertension     No family history on file.  No past surgical history on file. Social History   Occupational History  . Not on file.   Social History Main Topics  . Smoking status: Former Games developer  . Smokeless tobacco: Never Used  . Alcohol use Not on file  . Drug use: Unknown  . Sexual activity: Not on file

## 2017-03-23 ENCOUNTER — Encounter: Payer: Self-pay | Admitting: Family Medicine

## 2017-05-12 ENCOUNTER — Other Ambulatory Visit: Payer: Self-pay | Admitting: Family Medicine

## 2017-05-12 DIAGNOSIS — I10 Essential (primary) hypertension: Secondary | ICD-10-CM

## 2017-05-15 ENCOUNTER — Other Ambulatory Visit: Payer: Self-pay

## 2017-05-15 DIAGNOSIS — I10 Essential (primary) hypertension: Secondary | ICD-10-CM

## 2017-05-15 MED ORDER — VERAPAMIL HCL ER 120 MG PO TBCR: 120.0000 mg | EXTENDED_RELEASE_TABLET | Freq: Every day | ORAL | 0 refills | Status: DC

## 2017-06-18 ENCOUNTER — Other Ambulatory Visit: Payer: Self-pay | Admitting: Family Medicine

## 2017-06-18 DIAGNOSIS — I10 Essential (primary) hypertension: Secondary | ICD-10-CM

## 2017-08-07 IMAGING — US US ABDOMEN LIMITED
1 series · 14 of 25 positions shown · non-contrast
Comparison: Abdominal ultrasound dated [DATE].

CLINICAL DATA: Epigastric pain with elevated LFTs.

EXAM:
ULTRASOUND ABDOMEN LIMITED RIGHT UPPER QUADRANT

[Series 1: us abdomen limited · 0.22mm/px · 14 of 40 slices shown]
[im 1/40]
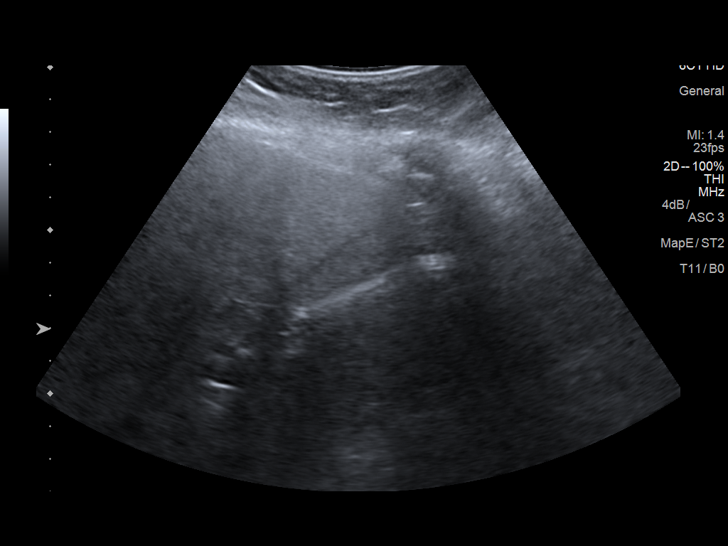
[im 4/40]
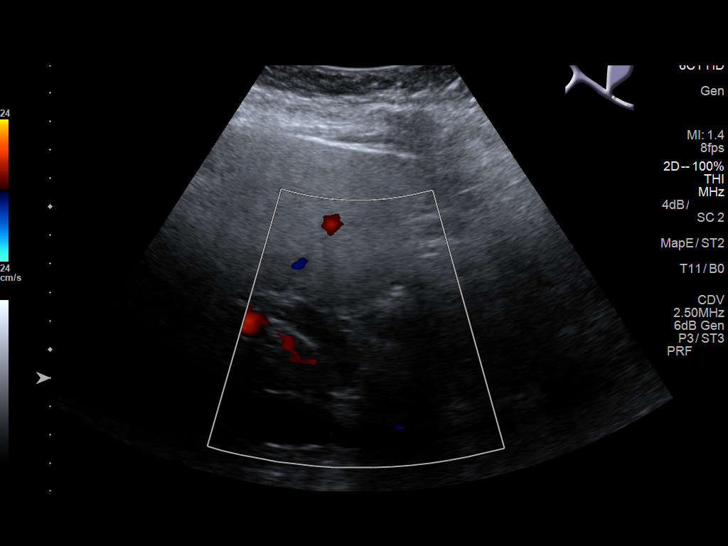
[im 7/40]
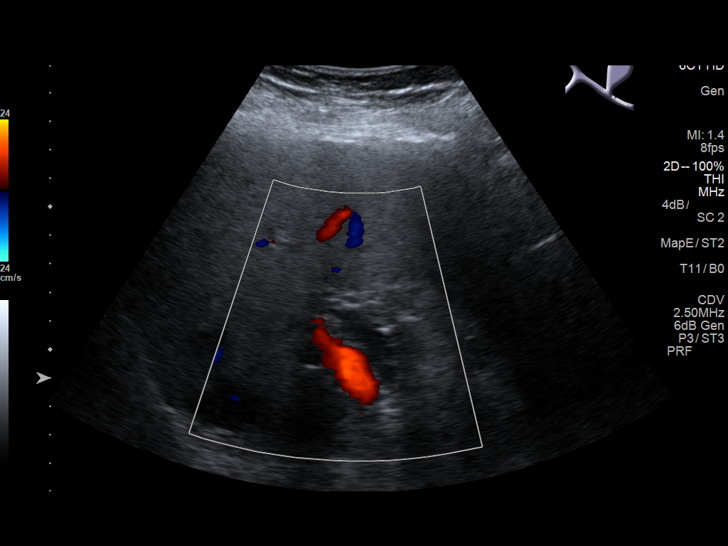
[im 10/40]
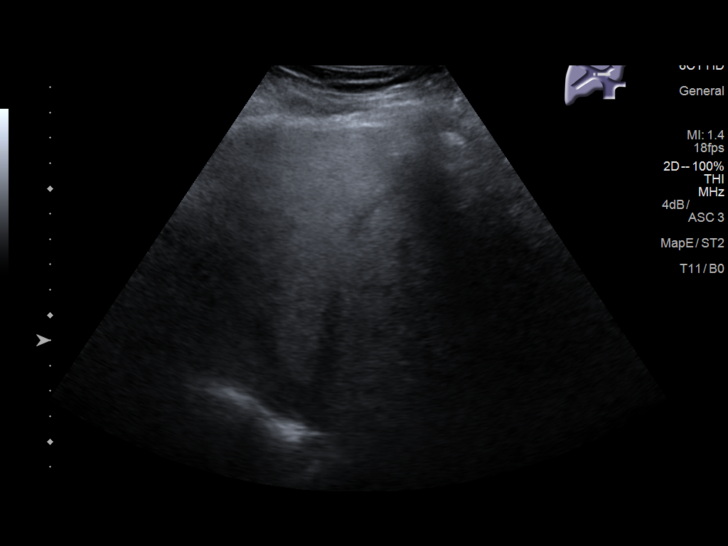
[im 14/40]
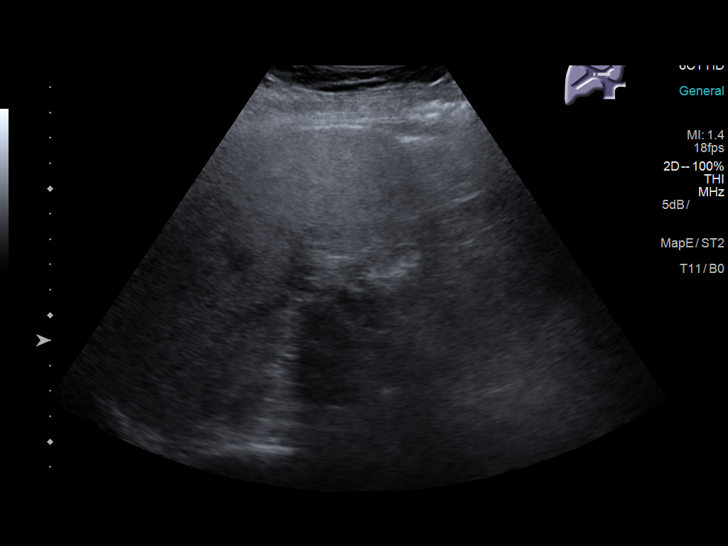
[im 15/40]
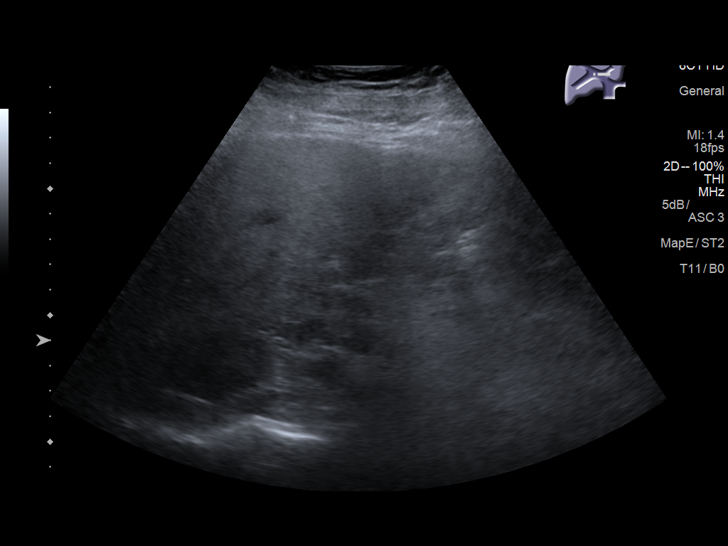
[im 18/40]
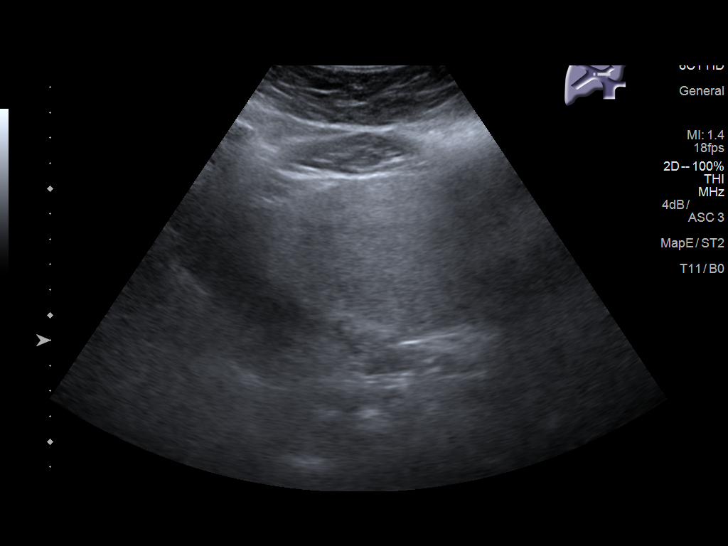
[im 22/40]
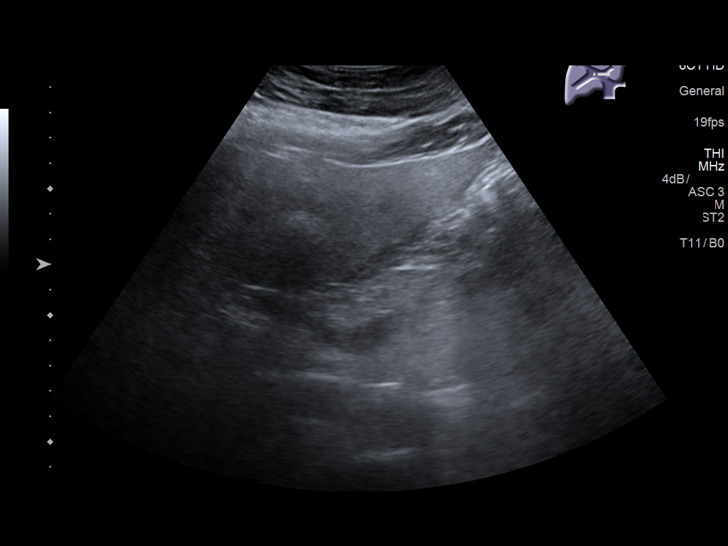
[im 25/40]
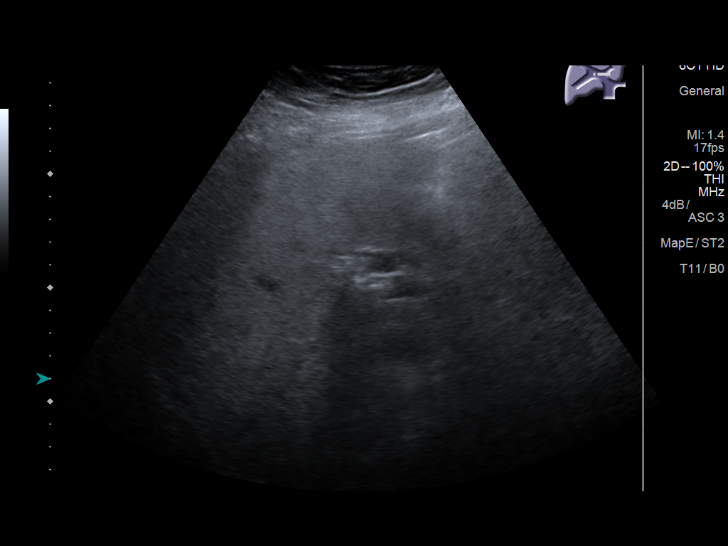
[im 27/40]
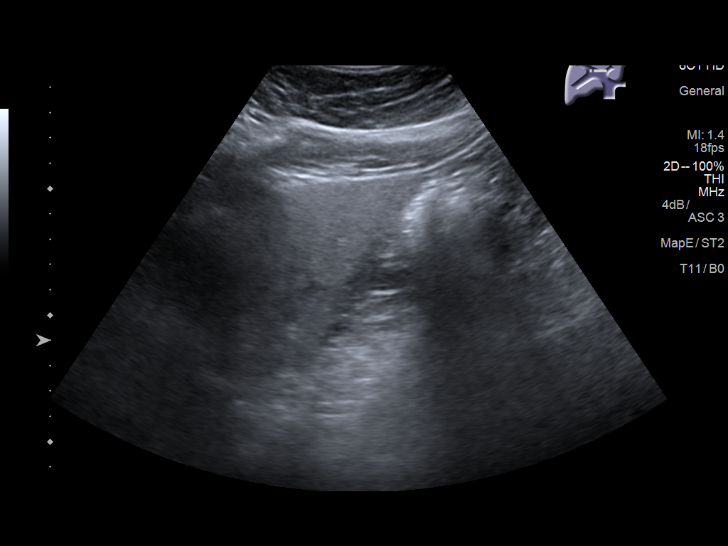
[im 30/40]
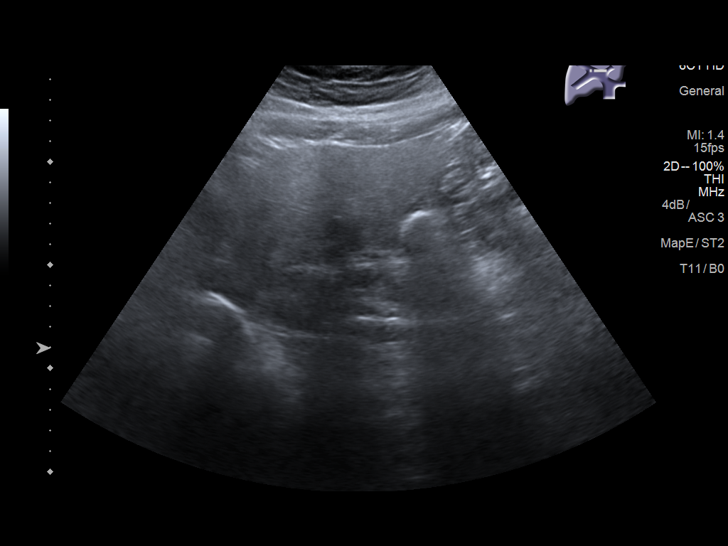
[im 33/40]
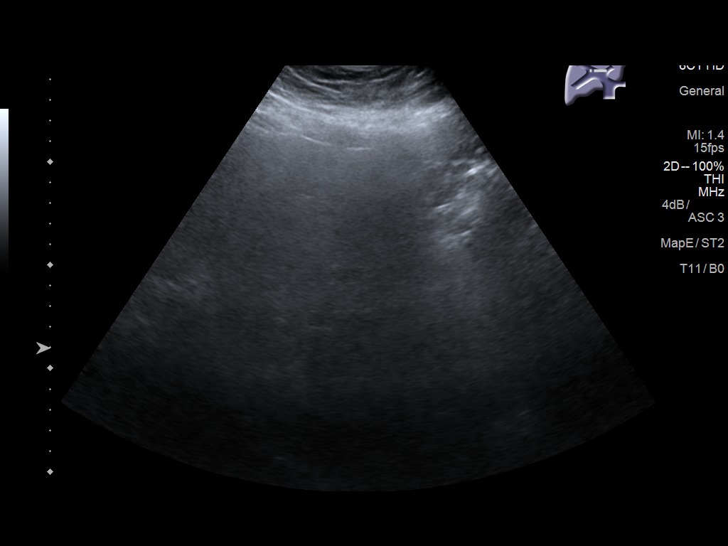
[im 36/40]
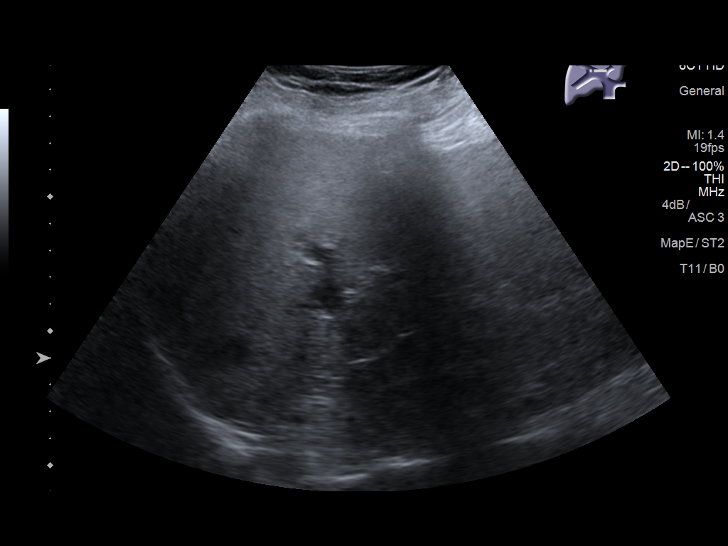
[im 40/40]
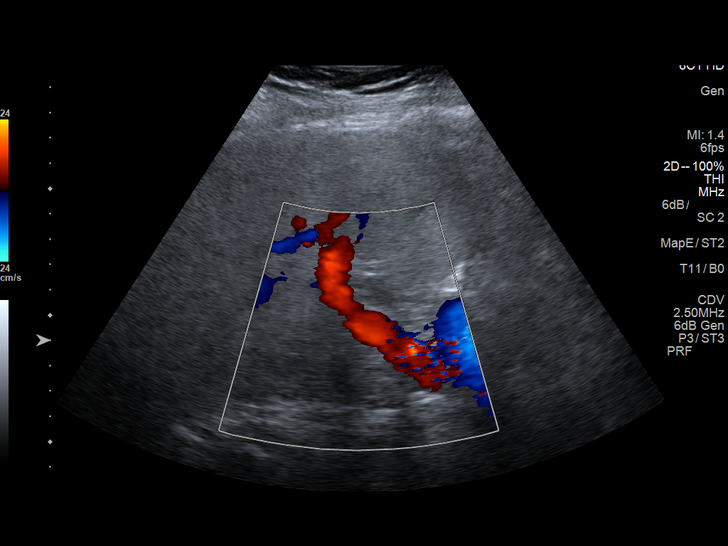

[14 of 25 positions shown; findings below may reference images not displayed]

FINDINGS: Gallbladder:

Surgically absent.

Common bile duct:

Diameter: 11 mm, mildly dilated, likely secondary to post
cholecystectomy state.

Liver:

No focal lesion identified. Diffusely increased in parenchymal
echogenicity. Portal vein is patent on color Doppler imaging with
normal direction of blood flow towards the liver.
IMPRESSION: 1. No acute abnormality.  Prior cholecystectomy.
2. Hepatic steatosis.

## 2017-08-19 ENCOUNTER — Encounter: Payer: Self-pay | Admitting: Physician Assistant

## 2017-08-19 ENCOUNTER — Ambulatory Visit (INDEPENDENT_AMBULATORY_CARE_PROVIDER_SITE_OTHER): Payer: BLUE CROSS/BLUE SHIELD | Admitting: Physician Assistant

## 2017-08-19 VITALS — BP 134/80 | HR 67 | Temp 98.2°F | Resp 16 | Ht 64.0 in | Wt 175.6 lb

## 2017-08-19 DIAGNOSIS — G5603 Carpal tunnel syndrome, bilateral upper limbs: Secondary | ICD-10-CM | POA: Diagnosis not present

## 2017-08-19 DIAGNOSIS — I10 Essential (primary) hypertension: Secondary | ICD-10-CM | POA: Diagnosis not present

## 2017-08-19 DIAGNOSIS — E7800 Pure hypercholesterolemia, unspecified: Secondary | ICD-10-CM | POA: Diagnosis not present

## 2017-08-19 DIAGNOSIS — Z23 Encounter for immunization: Secondary | ICD-10-CM | POA: Diagnosis not present

## 2017-08-19 DIAGNOSIS — R7309 Other abnormal glucose: Secondary | ICD-10-CM | POA: Diagnosis not present

## 2017-08-19 DIAGNOSIS — Z114 Encounter for screening for human immunodeficiency virus [HIV]: Secondary | ICD-10-CM

## 2017-08-19 MED ORDER — MELOXICAM 7.5 MG PO TABS: 7.5000 mg | ORAL_TABLET | Freq: Every day | ORAL | 0 refills | Status: DC

## 2017-08-19 MED ORDER — VERAPAMIL HCL ER 180 MG PO TBCR: 180.0000 mg | EXTENDED_RELEASE_TABLET | Freq: Every day | ORAL | 0 refills | Status: DC

## 2017-08-19 MED ORDER — TRAMADOL HCL 50 MG PO TABS: 50.0000 mg | ORAL_TABLET | Freq: Three times a day (TID) | ORAL | 0 refills | Status: DC | PRN

## 2017-08-19 NOTE — Progress Notes (Signed)
Morgan Ruiz  MRN: [id number] DOB: Aug 31, 1951  PCP: Mancel Bale, PA-C  Chief Complaint  Patient presents with  . Medication Refill    Verapamil and Tramadol    Subjective:  Pt presents to clinic for medication refill.    Hypertension - She checks her BP at home and it is high and she checks at work and it is sometimes high 170s/90s.  She does not keep record - rarely is it what is was today even when taken manually.  Orthopedic visit for carpal tunnel - it was not time for surgery  - she wears a brace at work and that helps but she still has daily pain.  The Ultram works but she only wants to take at night when the pain is really bad - she does not want to work after taking that medication.  DM - not on medications - check labs Elevated cholesterol - h/o but not been on medications for a while - she was on Zocor but was told that was causing her to feel bad with myalgias so she stopped it.  She exercises with weights and swimming.  History is obtained by patient.  Review of Systems  Constitutional: Negative for chills and fever.  Eyes: Negative for visual disturbance.  Respiratory: Negative for shortness of breath.   Cardiovascular: Negative for chest pain, palpitations and leg swelling.  Musculoskeletal: Positive for arthralgias (known carpal tunnel - has daily pain - like stiffness also in her fingers).  Neurological: Negative for dizziness, light-headedness and headaches.    Patient Active Problem List   Diagnosis Date Noted  . Diabetes mellitus without complication ([facility]) 38/45/3646  . Hypertension 01/24/2016  . Hyperlipidemia 01/24/2016    No current outpatient prescriptions on file prior to visit.   No current facility-administered medications on file prior to visit.     Allergies  Allergen Reactions  . Vicodin [Hydrocodone-Acetaminophen] Rash    Past Medical History:  Diagnosis Date  . Diabetes mellitus without complication ([facility])   . Hyperlipidemia    . Hypertension    Social History   Social History Narrative   From [place] - to [place] in 1999   Married    daughter and 2 grandchild   Social History  Substance Use Topics  . Smoking status: Former Smoker    Quit date: 11/21/2012  . Smokeless tobacco: Never Used     Comment: social smoker  . Alcohol use No   family history includes Diabetes type II in her mother; Heart attack in her brother and mother; Liver cancer in her father.     Objective:  BP 134/80 (BP Location: Right Arm, Patient Position: Sitting, Cuff Size: Large)   Pulse 67   Temp 98.2 F (36.8 C) (Oral)   Resp 16   Ht '5\' 4"'  (1.626 m)   Wt 175 lb 9.6 oz (79.7 kg)   SpO2 97%   BMI 30.14 kg/m  Body mass index is 30.14 kg/m.  Physical Exam  Constitutional: She is oriented to person, place, and time and well-developed, well-nourished, and in no distress.  HENT:  Head: Normocephalic and atraumatic.  Right Ear: Hearing and external ear normal.  Left Ear: Hearing and external ear normal.  Eyes: Conjunctivae are normal.  Neck: Normal range of motion.  Cardiovascular: Normal rate, regular rhythm and normal heart sounds.   No murmur heard. Pulmonary/Chest: Effort normal and breath sounds normal.  Musculoskeletal:       Right lower leg: She exhibits no edema.  Left lower leg: She exhibits no edema.  Neurological: She is alert and oriented to person, place, and time. Gait normal.  Skin: Skin is warm and dry.  Psychiatric: Mood, memory, affect and judgment normal.  Vitals reviewed.   Assessment and Plan :  Uncontrolled hypertension - Plan: verapamil (CALAN-SR) 180 MG CR tablet, CMP14+EGFR, Care order/instruction: - increase verapamil to see if we can get better control of BP  Need for prophylactic vaccination and inoculation against influenza - Plan: Flu Vaccine QUAD 36+ mos IM  Elevated hemoglobin A1c - Plan: Hemoglobin A1c, CMP14+EGFR - check labs as when it was checked last she was close to needing  mediations  Elevated cholesterol - Plan: Lipid panel - has been on medication in the past - check labs to see if she need additional medications at this time  Bilateral carpal tunnel syndrome - Plan: traMADol (ULTRAM) 50 MG tablet, meloxicam (MOBIC) 7.5 MG tablet - she has seen surgeon and surgery was not recommended.  Will start Mobic for daily pain at a low dose and she will use Ultram rarely #15 should last for months as it has done since 3/18.  Screening for HIV (human immunodeficiency virus) - Plan: HIV   Recheck with me in about 2 months.  Windell Hummingbird PA-C  [facility] 08/19/2017 10:13 AM

## 2017-08-19 NOTE — Patient Instructions (Addendum)
     IF you received an x-ray today, you will receive an invoice from [facility]. Please contact [facility] at [phone] with questions or concerns regarding your invoice.   IF you received labwork today, you will receive an invoice from [facility]. Please contact [facility] at [phone] with questions or concerns regarding your invoice.   Our billing staff will not be able to assist you with questions regarding bills from these companies.  You will be contacted with the lab results as soon as they are available. The fastest way to get your results is to activate your My Chart account. Instructions are located on the last page of this paperwork. If you have not heard from [place] regarding the results in 2 weeks, please contact this office.    Please call the week of Thanksgiving to make an appt with me that Saturday.  Please download the APP called mychart - then use the text to activate this APP - this will allow you to look at your labs and contact me as well as make appointments to see me in the future.

## 2017-08-20 LAB — LIPID PANEL
CHOLESTEROL TOTAL: 222 mg/dL — AB (ref 100–199)
Chol/HDL Ratio: 4 ratio (ref 0.0–4.4)
HDL: 55 mg/dL (ref >39)
LDL Calculated: 143 mg/dL — ABNORMAL HIGH (ref 0–99)
Triglycerides: 120 mg/dL (ref 0–149)
VLDL Cholesterol Cal: 24 mg/dL (ref 5–40)

## 2017-08-20 LAB — CMP14+EGFR
ALBUMIN: 4.5 g/dL (ref 3.6–4.8)
ALT: 19 IU/L (ref 0–32)
AST: 19 IU/L (ref 0–40)
Albumin/Globulin Ratio: 1.5 (ref 1.2–2.2)
Alkaline Phosphatase: 61 IU/L (ref 39–117)
BUN / CREAT RATIO: 22 (ref 12–28)
BUN: 17 mg/dL (ref 8–27)
Bilirubin Total: 0.5 mg/dL (ref 0.0–1.2)
CALCIUM: 10.2 mg/dL (ref 8.7–10.3)
CO2: 25 mmol/L (ref 20–29)
CREATININE: 0.77 mg/dL (ref 0.57–1.00)
Chloride: 99 mmol/L (ref 96–106)
GFR calc Af Amer: 94 mL/min/{1.73_m2} (ref >59)
GFR, EST NON AFRICAN AMERICAN: 82 mL/min/{1.73_m2} (ref >59)
GLOBULIN, TOTAL: 3.1 g/dL (ref 1.5–4.5)
GLUCOSE: 117 mg/dL — AB (ref 65–99)
Potassium: 5.1 mmol/L (ref 3.5–5.2)
Sodium: 138 mmol/L (ref 134–144)
Total Protein: 7.6 g/dL (ref 6.0–8.5)

## 2017-08-20 LAB — HEMOGLOBIN A1C
ESTIMATED AVERAGE GLUCOSE: 131 mg/dL
Hgb A1c MFr Bld: 6.2 % — ABNORMAL HIGH (ref 4.8–5.6)

## 2017-08-20 LAB — HIV ANTIBODY (ROUTINE TESTING W REFLEX): HIV Screen 4th Generation wRfx: NONREACTIVE

## 2017-10-10 ENCOUNTER — Ambulatory Visit: Payer: BLUE CROSS/BLUE SHIELD | Admitting: Family Medicine

## 2017-10-10 ENCOUNTER — Ambulatory Visit: Payer: Self-pay | Admitting: *Deleted

## 2017-10-10 ENCOUNTER — Other Ambulatory Visit: Payer: Self-pay

## 2017-10-10 ENCOUNTER — Encounter: Payer: Self-pay | Admitting: Family Medicine

## 2017-10-10 VITALS — BP 140/80 | HR 73 | Temp 97.7°F | Resp 18 | Ht 65.75 in | Wt 176.8 lb

## 2017-10-10 DIAGNOSIS — G5702 Lesion of sciatic nerve, left lower limb: Secondary | ICD-10-CM

## 2017-10-10 DIAGNOSIS — I10 Essential (primary) hypertension: Secondary | ICD-10-CM

## 2017-10-10 MED ORDER — LISINOPRIL 20 MG PO TABS: 20.0000 mg | ORAL_TABLET | Freq: Every day | ORAL | 3 refills | Status: DC

## 2017-10-10 MED ORDER — KETOROLAC TROMETHAMINE 60 MG/2ML IM SOLN
60.0000 mg | Freq: Once | INTRAMUSCULAR | Status: AC
  Administered 2017-10-10: 60 mg via INTRAMUSCULAR

## 2017-10-10 MED ORDER — TRAMADOL HCL 50 MG PO TABS: 50.0000 mg | ORAL_TABLET | Freq: Three times a day (TID) | ORAL | 0 refills | Status: DC | PRN

## 2017-10-10 NOTE — Telephone Encounter (Signed)
   Reason for Disposition . [1] SEVERE pain (e.g., excruciating, unable to do any normal activities) AND [2] not improved after 2 hours of pain medicine  Answer Assessment - Initial Assessment Questions 1. ONSET: "When did the pain start?"      Lower buttock and leg-left 2. LOCATION: "Where is the pain located?"      Some pain in back- but mostly in buttock and leg 3. PAIN: "How bad is the pain?"    (Scale 1-10; or mild, moderate, severe)   -  MILD (1-3): doesn't interfere with normal activities    -  MODERATE (4-7): interferes with normal activities (e.g., work or school) or awakens from sleep, limping    -  SEVERE (8-10): excruciating pain, unable to do any normal activities, unable to walk     7-8 4. WORK OR EXERCISE: "Has there been any recent work or exercise that involved this part of the body?"      Working in pain 5. CAUSE: "What do you think is causing the leg pain?"     possibly pulled a nerve- 2-3 weeks 6. OTHER SYMPTOMS: "Do you have any other symptoms?" (e.g., chest pain, back pain, breathing difficulty, swelling, rash, fever, numbness, weakness)     Some arm pain from carpel tunnel  7. PREGNANCY: "Is there any chance you are pregnant?" "When was your last menstrual period?"     n/a  Protocols used: LEG PAIN-A-AH

## 2017-10-10 NOTE — Progress Notes (Signed)
11/20/20186:46 PM  Jamey RipaLjiljana Connelly 1951-03-04, 66 y.o. female [id number]  Chief Complaint  Patient presents with  . Buttock Pain    X 2 days- left side going down leg    HPI:   Patient is a 66 y.o. female who presents today for 2 days of left buttock pain that goes down her thigh. 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 Patient denies any changes in bowel or bladder function. She denies any weakness. She has never had this pain before. Has not been taking NSAIDs as much as they tend to hurt her stomach, which has become more sensitive since placed on verapamil for HTN. She also has been doing heat and OTC lidocaine patches, which have not been that beneficial. She has been taking tramadol more often, normally takes for carpal tunnel.   Dep[facility] screen [facility] 2[facility] 10/10/2017 08/19/2017 02/13/2017  Decreased Interest 0 0 0  Down, Depressed, Hopeless 0 0 0  PHQ - 2 Score 0 0 0    Allergies  Allergen Reactions  . Vicodin [Hydrocodone-Acetaminophen] Rash    Prior to Admission medications   Medication Sig Start Date End Date Taking? Authorizing Provider  meloxicam (MOBIC) 7.5 MG tablet Take 1 tablet (7.5 mg total) by mouth daily. 08/19/17  Yes Weber, Sarah L, PA-C  traMADol (ULTRAM) 50 MG tablet Take 1 tablet (50 mg total) by mouth every 8 (eight) hours as needed. 10/10/17  Yes Myles LippsSantiago, Daisha Filosa M, MD  lisinopril (PRINIVIL,ZESTRIL) 20 MG tablet Take 1 tablet (20 mg total) by mouth daily. 10/10/17   Myles LippsSantiago, Dannis Deroche M, MD    Past Medical History:  Diagnosis Date  . Diabetes mellitus without complication (HCC)   . Hyperlipidemia   . Hypertension     History reviewed. No pertinent surgical history.  Social History   Tobacco Use  . Smoking status: Former Smoker    Last attempt to quit: 11/21/2012    Years since quitting: 4.8  . Smokeless tobacco: Never Used  . Tobacco comment: social smoker  Substance Use Topics  .  Alcohol use: No    Alcohol/week: 0.0 oz    Family History  Problem Relation Age of Onset  . Diabetes type II Mother   . Heart attack Mother   . Liver cancer Father   . Heart attack Brother     Review of Systems  Constitutional: Negative for chills and fever.  Musculoskeletal: Positive for back pain. Negative for falls.  Neurological: Positive for tingling. Negative for focal weakness.     OBJECTIVE:  Blood pressure 140/80, pulse 73, temperature 97.7 F (36.5 C), temperature source Oral, resp. rate 18, height 5' 5.75" (1.67 m), weight 176 lb 12.8 oz (80.2 kg), SpO2 97 %.  Physical Exam  Constitutional: She is oriented to person, place, and time and well-developed, well-nourished, and in no distress.  HENT:  Head: Normocephalic and atraumatic.  Mouth/Throat: Oropharynx is clear and moist. No oropharyngeal exudate.  Eyes: EOM are normal. Pupils are equal, round, and reactive to light. No scleral icterus.  Neck: Neck supple.  Cardiovascular: Normal rate, regular rhythm and normal heart sounds. Exam reveals no gallop and no friction rub.  No murmur heard. Pulmonary/Chest: Effort normal and breath sounds normal. She has no wheezes. She has no rales.  Musculoskeletal: She exhibits no edema.       Lumbar back: She exhibits tenderness. She exhibits no bony tenderness and no spasm.  Patient with LEFT paraspinal and piriformis  TTP  Neurological: She is alert and oriented to person, place, and time. She has normal strength and normal reflexes. She has a normal Straight Leg Raise Test. Gait normal.  Skin: Skin is warm and dry.   ASSESSMENT and PLAN  1. Piriformis syndrome of left side Discussed supportive measures, new meds r/se/b and RTC precautions. Patient educational handout given. - ketorolac (TORADOL) injection 60 mg - traMADol (ULTRAM) 50 MG tablet; Take 1 tablet (50 mg total) by mouth every 8 (eight) hours as needed  2. Essential hypertension - lisinopril (PRINIVIL,ZESTRIL)  20 MG tablet; Take 1 tablet (20 mg total) by mouth daily.  Return in about 2 weeks (around 10/24/2017) for HTN.    Myles LippsIrma M Santiago, MD [facility] at [facility] [address] [place], [place] [address] Ph.  [phone] Fax [phone]

## 2017-10-10 NOTE — Patient Instructions (Addendum)
   IF you received an x-ray today, you will receive an invoice from [facility]. Please contact [facility] at [phone] with questions or concerns regarding your invoice.   IF you received labwork today, you will receive an invoice from [facility]. Please contact [facility] at [phone] with questions or concerns regarding your invoice.   Our billing staff will not be able to assist you with questions regarding bills from these companies.  You will be contacted with the lab results as soon as they are available. The fastest way to get your results is to activate your My Chart account. Instructions are located on the last page of this paperwork. If you have not heard from us regarding the results in 2 weeks, please contact this office.      Piriformis Syndrome Rehab Ask your health care provider which exercises are safe for you. Do exercises exactly as told by your health care provider and adjust them as directed. It is normal to feel mild stretching, pulling, tightness, or discomfort as you do these exercises, but you should stop right away if you feel sudden pain or your pain gets worse.Do not begin these exercises until told by your health care provider. Stretching and range of motion exercises These exercises warm up your muscles and joints and improve the movement and flexibility of your hip and pelvis. These exercises also help to relieve pain, numbness, and tingling. Exercise A: Hip rotators  1. Lie on your back on a firm surface. 2. Pull your left / right knee toward your same shoulder with your left / right hand until your knee is pointing toward the ceiling. Hold your left / right ankle with your other hand. 3. Keeping your knee steady, gently pull your left / right ankle toward your other shoulder until you feel a stretch in your buttocks. 4. Hold this position for __________ seconds. Repeat __________ times. Complete this stretch __________ times a  day. Exercise B: Hip extensors 1. Lie on your back on a firm surface. Both of your legs should be straight. 2. Pull your left / right knee to your chest. Hold your leg in this position by holding onto the back of your thigh or the front of your knee. 3. Hold this position for __________ seconds. 4. Slowly return to the starting position. Repeat __________ times. Complete this stretch __________ times a day. Strengthening exercises These exercises build strength and endurance in your hip and thigh muscles. Endurance is the ability to use your muscles for a long time, even after they get tired. Exercise C: Straight leg raises ( hip abductors) 1. Lie on your side with your left / right leg in the top position. Lie so your head, shoulder, knee, and hip line up. Bend your bottom knee to help you balance. 2. Lift your top leg up 4-6 inches (10-15 cm), keeping your toes pointed straight ahead. 3. Hold this position for __________ seconds. 4. Slowly lower your leg to the starting position. Let your muscles relax completely. Repeat __________ times. Complete this exercise__________ times a day. Exercise D: Hip abductors and rotators, quadruped  1. Get on your hands and knees on a firm, lightly padded surface. Your hands should be directly below your shoulders, and your knees should be directly below your hips. 2. Lift your left / right knee out to the side. Keep your knee bent. Do not twist your body. 3. Hold this position for __________ seconds. 4. Slowly lower your leg. Repeat __________ times. Complete   this exercise__________ times a day. Exercise E: Straight leg raises ( hip extensors) 1. Lie on your abdomen on a bed or a firm surface with a pillow under your hips. 2. Squeeze your buttock muscles and lift your left / right thigh off the bed. Do not let your back arch. 3. Hold this position for __________ seconds. 4. Slowly return to the starting position. Let your muscles relax completely  before doing another repetition. Repeat __________ times. Complete this exercise__________ times a day. This information is not intended to replace advice given to you by your health care provider. Make sure you discuss any questions you have with your health care provider. Document Released: 11/07/2005 Document Revised: 07/12/2016 Document Reviewed: 10/20/2015 Elsevier Interactive Patient Education  2018 [employer]  

## 2017-10-16 ENCOUNTER — Ambulatory Visit (INDEPENDENT_AMBULATORY_CARE_PROVIDER_SITE_OTHER): Payer: BLUE CROSS/BLUE SHIELD

## 2017-10-16 ENCOUNTER — Other Ambulatory Visit: Payer: Self-pay

## 2017-10-16 ENCOUNTER — Ambulatory Visit: Payer: BLUE CROSS/BLUE SHIELD | Admitting: Physician Assistant

## 2017-10-16 ENCOUNTER — Encounter: Payer: Self-pay | Admitting: Physician Assistant

## 2017-10-16 VITALS — BP 138/88 | HR 78 | Resp 16 | Ht 65.75 in | Wt 176.8 lb

## 2017-10-16 DIAGNOSIS — M5137 Other intervertebral disc degeneration, lumbosacral region: Secondary | ICD-10-CM | POA: Diagnosis not present

## 2017-10-16 DIAGNOSIS — M5442 Lumbago with sciatica, left side: Secondary | ICD-10-CM | POA: Diagnosis not present

## 2017-10-16 LAB — POCT URINALYSIS DIP (MANUAL ENTRY)
BILIRUBIN UA: NEGATIVE
GLUCOSE UA: NEGATIVE mg/dL
Ketones, POC UA: NEGATIVE mg/dL
Nitrite, UA: NEGATIVE
Protein Ur, POC: NEGATIVE mg/dL
RBC UA: NEGATIVE
Urobilinogen, UA: 0.2 E.U./dL
pH, UA: 6.5 (ref 5.0–8.0)

## 2017-10-16 IMAGING — DX DG LUMBAR SPINE COMPLETE 4+V
5 series · 5 of 5 positions shown · non-contrast
Comparison: None.

CLINICAL DATA: Low back and left leg pain for 2 weeks.

EXAM:
LUMBAR SPINE - COMPLETE 4+ VIEW

[l-spine ap]
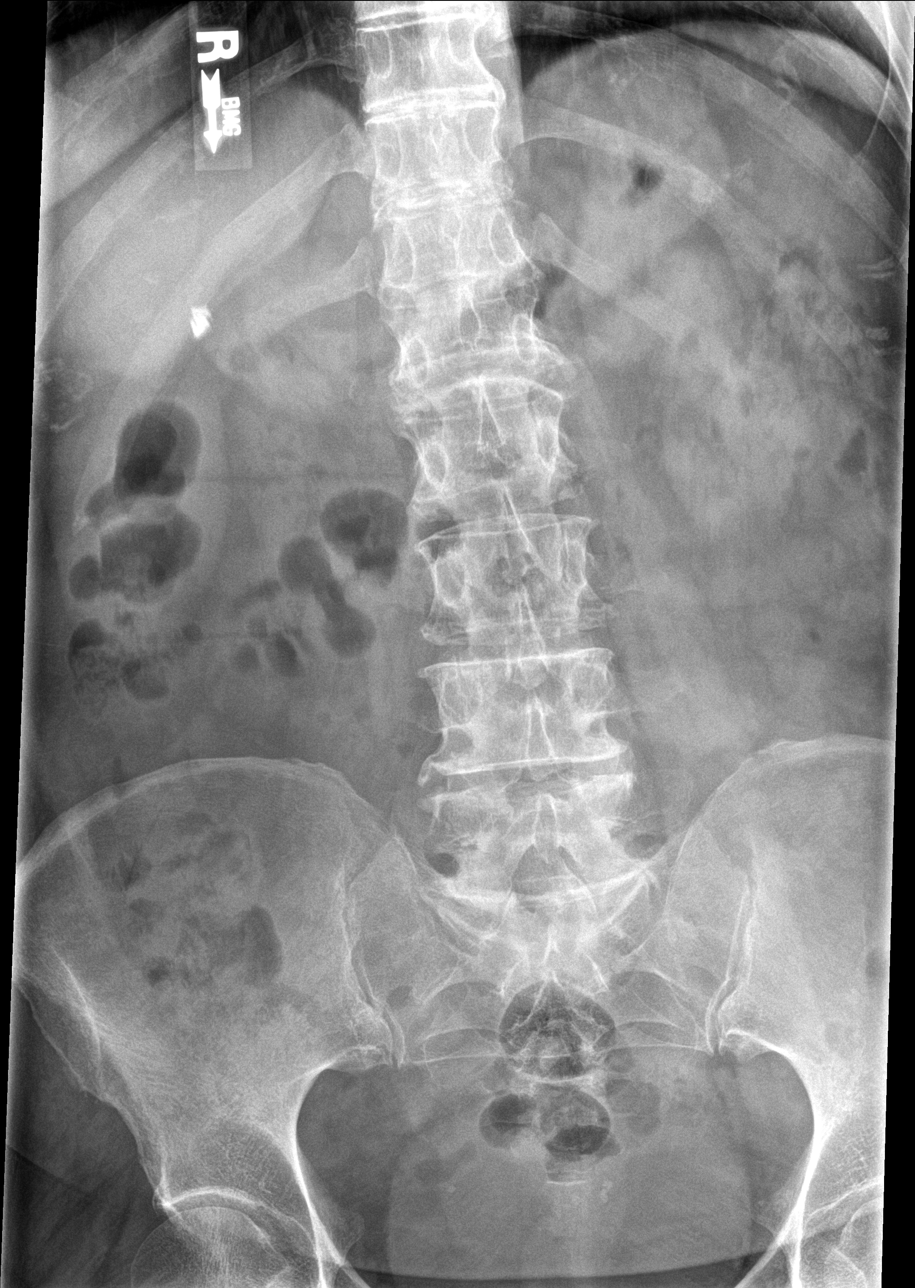

[l-spine obl (1 of 2)]
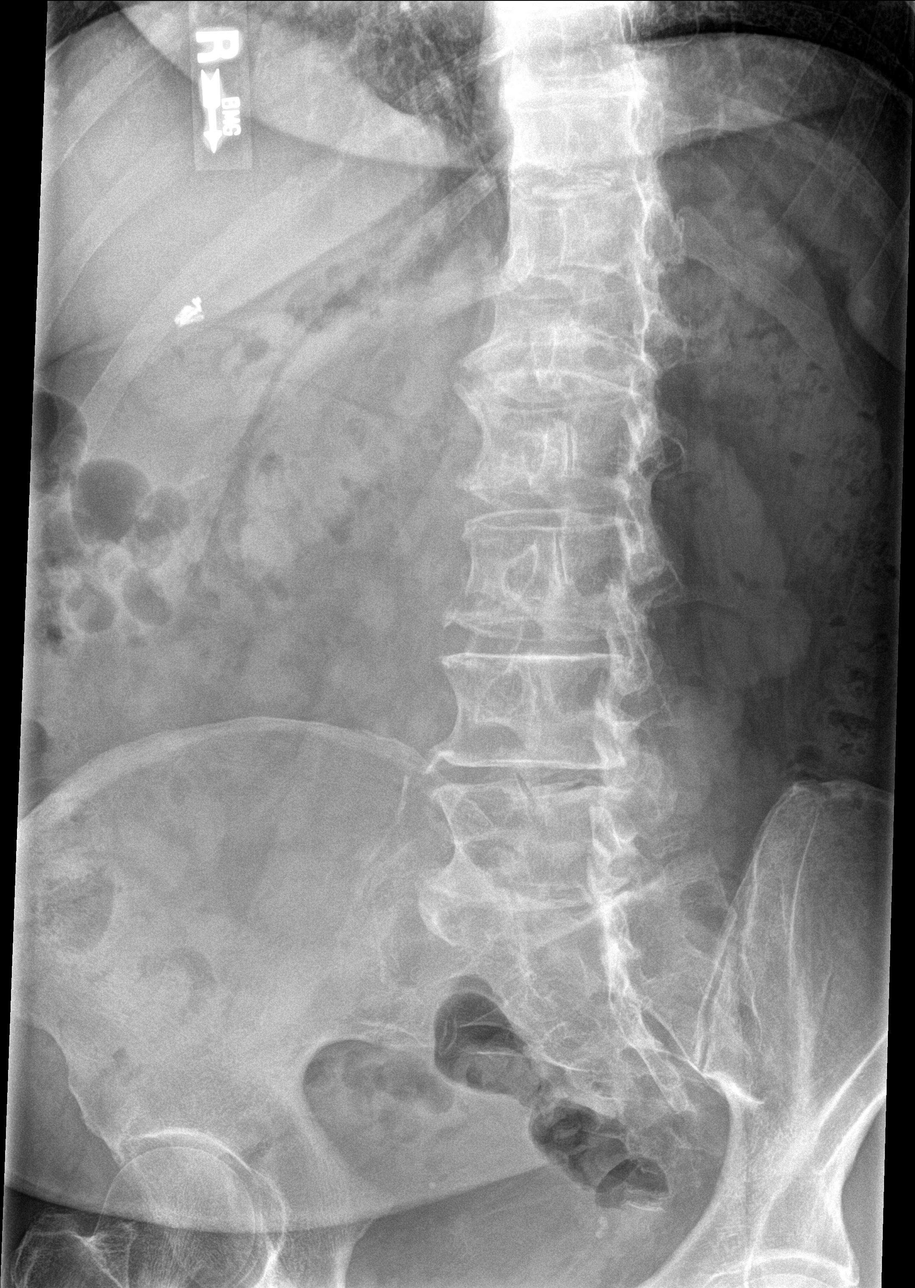

[l-spine obl (2 of 2)]
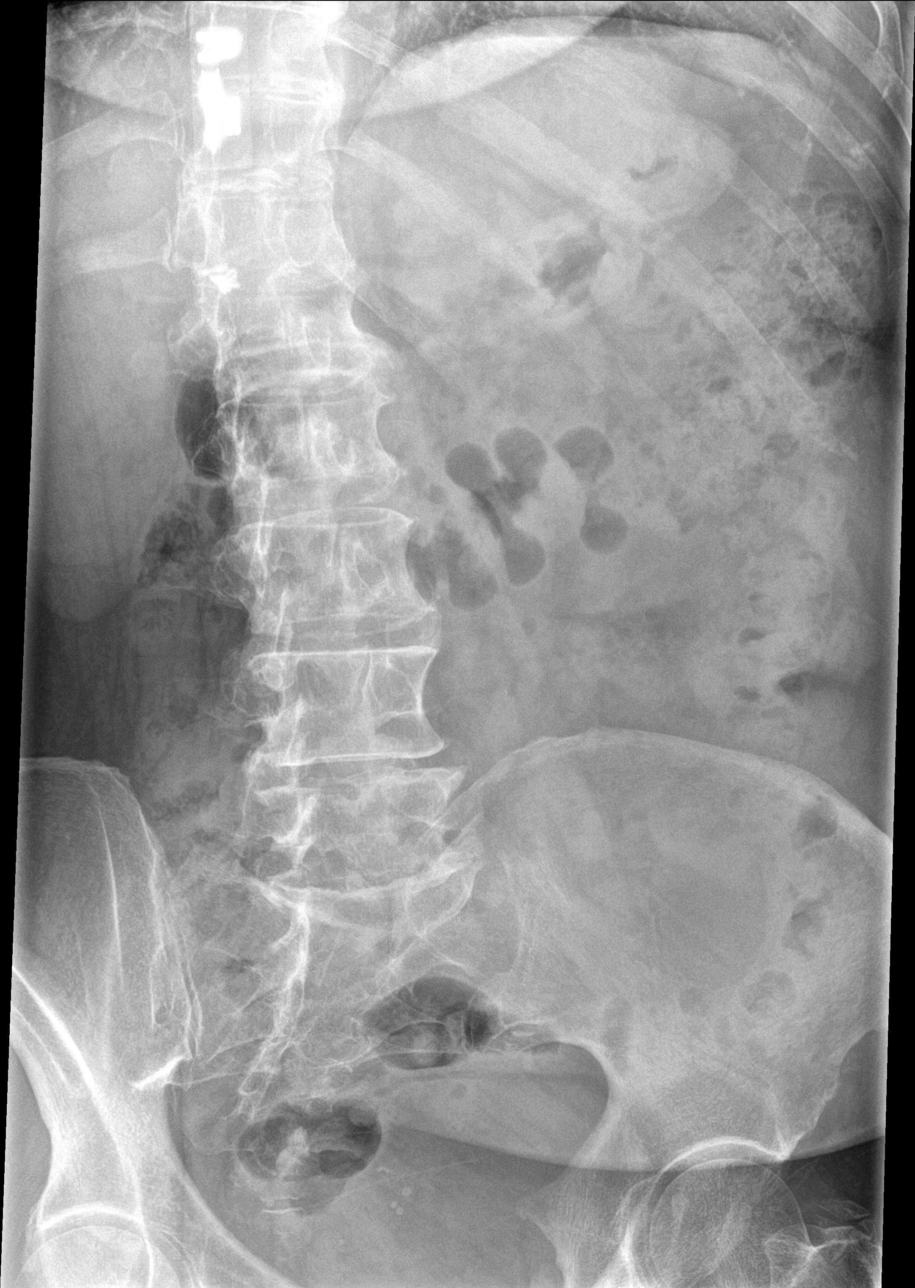

[l-spine lat]
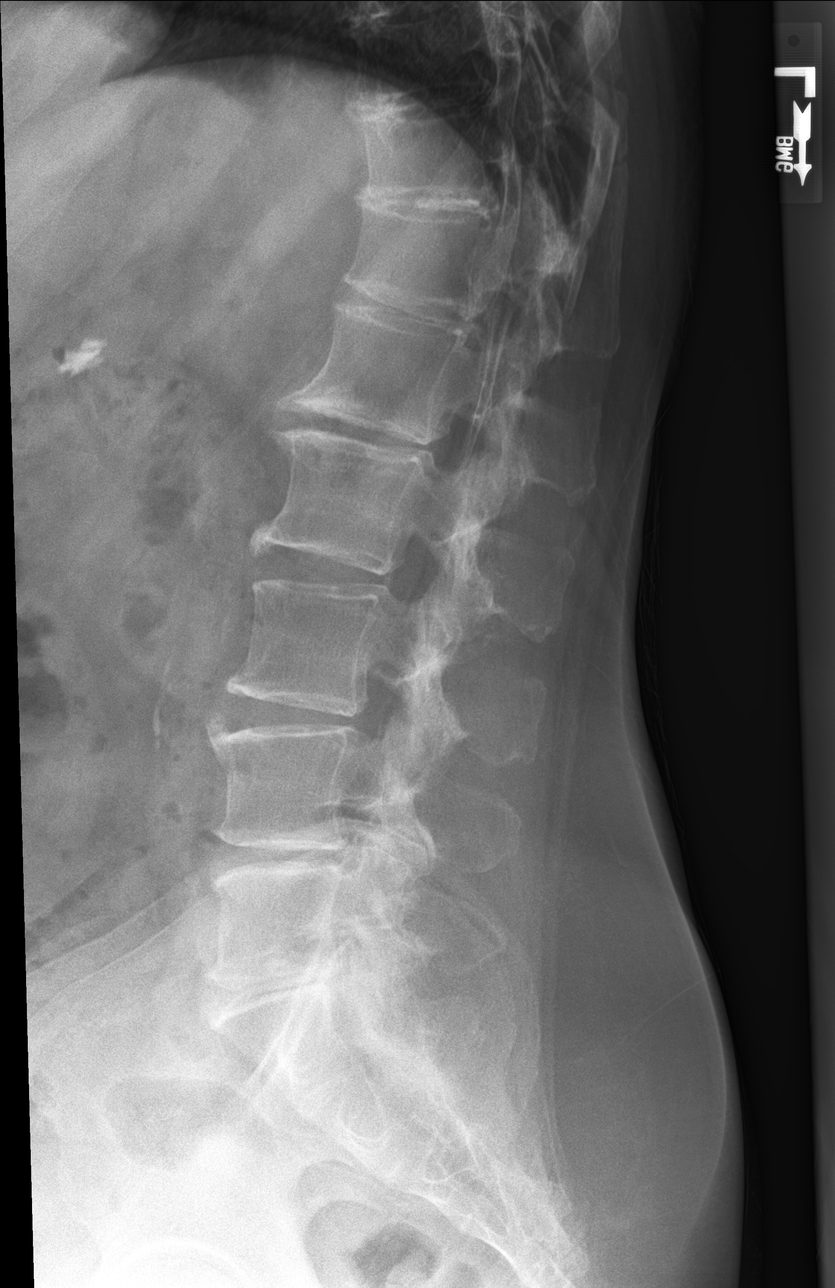

[l-spine l5-s1]
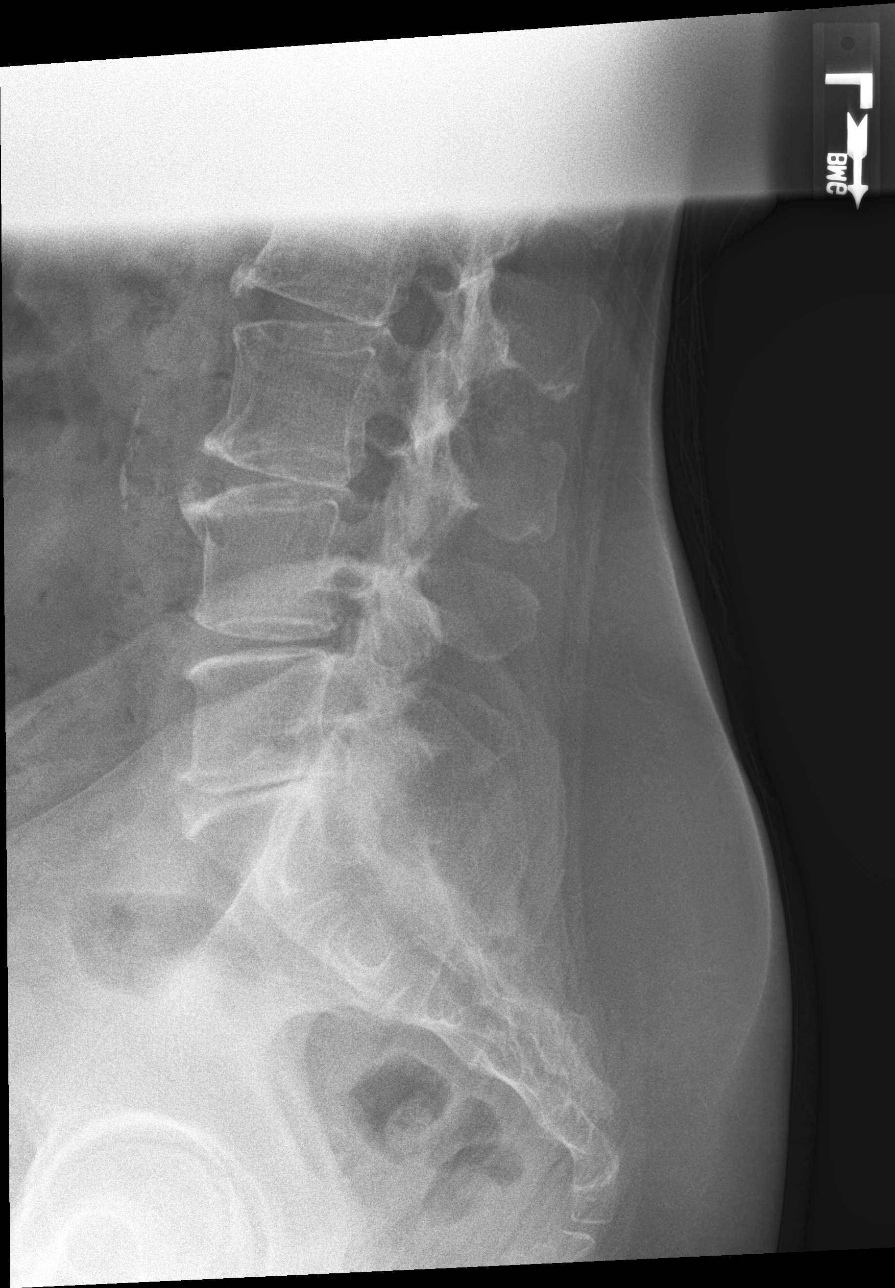

[5 of 5 positions shown; findings below may reference images not displayed]

FINDINGS: No fracture or spondylolisthesis is noted. Moderate degenerative
disc disease is noted at L1-2, L4-5 and L5-S1. Mild degenerative
disc disease is noted at L2-3 and L3-4. Atherosclerosis of abdominal
aorta is noted.
IMPRESSION: Multilevel degenerative disc disease. No acute abnormality seen in
the lumbar spine.

## 2017-10-16 MED ORDER — CYCLOBENZAPRINE HCL 10 MG PO TABS: 5.0000 mg | ORAL_TABLET | Freq: Three times a day (TID) | ORAL | 0 refills | Status: DC | PRN

## 2017-10-16 MED ORDER — PREDNISONE 20 MG PO TABS: ORAL_TABLET | ORAL | 0 refills | Status: AC

## 2017-10-16 NOTE — Progress Notes (Signed)
10/16/2017 6:13 PM   DOB: August 02, 1951 / MRN: [id number]  SUBJECTIVE:  Morgan Ruiz is a 66 y.o. female presenting for unremitting right sided back pain recalcitrant to treatment with tramadol and mobic.  She feels that she is only getting worse. She has been seen by Dr. Leretha PolSantiago.  Tells me the meloxicam bothers her stomach so she can not really take those.    Complains of foul smelling urine and urinary urgency.   She is allergic to vicodin [hydrocodone-acetaminophen].   She  has a past medical history of Diabetes mellitus without complication (HCC), Hyperlipidemia, and Hypertension.    She  reports that she quit smoking about 4 years ago. she has never used smokeless tobacco. She reports that she does not drink alcohol or use drugs. She  reports that she currently engages in sexual activity. The patient  has no past surgical history on file.  Her family history includes Diabetes type II in her mother; Heart attack in her brother and mother; Liver cancer in her father.  Review of Systems  Constitutional: Negative for chills and fever.  Musculoskeletal: Positive for back pain. Negative for falls, joint pain, myalgias and neck pain.  Skin: Negative for itching and rash.    The problem list and medications were reviewed and updated by myself where necessary and exist elsewhere in the encounter.   OBJECTIVE:  BP 138/88 (BP Location: Right Arm, Patient Position: Sitting, Cuff Size: Normal)   Pulse 78   Resp 16   Ht 5' 5.75" (1.67 m)   Wt 176 lb 12.8 oz (80.2 kg)   SpO2 97%   BMI 28.75 kg/m   Lab Results  Component Value Date   HGBA1C 6.2 (H) 08/19/2017   Physical Exam  Constitutional: She is active.  Non-toxic appearance.  Cardiovascular: Normal rate, regular rhythm, S1 normal, S2 normal, normal heart sounds and intact distal pulses. Exam reveals no gallop, no friction rub and no decreased pulses.  No murmur heard. Pulmonary/Chest: Effort normal. No stridor. No tachypnea.  No respiratory distress. She has no wheezes. She has no rales.  Abdominal: She exhibits no distension.  Musculoskeletal: Normal range of motion. She exhibits no edema, tenderness or deformity.  Neurological: She is alert. Gait (antalgic) abnormal.  Reflex Scores:      Patellar reflexes are 2+ on the right side and 2+ on the left side.      Achilles reflexes are 2+ on the right side and 0 on the left side. Skin: Skin is warm and dry. She is not diaphoretic. No pallor.    Results for orders placed or performed in visit on 10/16/17 (from the past 72 hour(s))  POCT urinalysis dipstick     Status: Abnormal   Collection Time: 10/16/17  5:57 PM  Result Value Ref Range   Color, UA yellow yellow   Clarity, UA clear clear   Glucose, UA negative negative mg/dL   Bilirubin, UA negative negative   Ketones, POC UA negative negative mg/dL   Spec Grav, UA <=9.147<=1.005 (A) 1.010 - 1.025   Blood, UA negative negative   pH, UA 6.5 5.0 - 8.0   Protein Ur, POC negative negative mg/dL   Urobilinogen, UA 0.2 0.2 or 1.0 E.U./dL   Nitrite, UA Negative Negative   Leukocytes, UA Trace (A) Negative    Dg Lumbar Spine Complete  Result Date: 10/16/2017 CLINICAL DATA:  Low back and left leg pain for 2 weeks. EXAM: LUMBAR SPINE - COMPLETE 4+ VIEW COMPARISON:  None.  FINDINGS: No fracture or spondylolisthesis is noted. Moderate degenerative disc disease is noted at L1-2, L4-5 and L5-S1. Mild degenerative disc disease is noted at L2-3 and L3-4. Atherosclerosis of abdominal aorta is noted. IMPRESSION: Multilevel degenerative disc disease. No acute abnormality seen in the lumbar spine. Electronically Signed   By: Lupita RaiderJames  Green Jr, M.D.   On: 10/16/2017 17:42    ASSESSMENT AND PLAN:  Morgan Ruiz was seen today for leg pain.  Diagnoses and all orders for this visit:  Acute left-sided low back pain with left-sided sciatica -     DG Lumbar Spine Complete; Future -     POCT urinalysis dipstick -     Urine Culture  DDD  (degenerative disc disease), lumbosacral -     predniSONE (DELTASONE) 20 MG tablet; Take 3 in the morning for 3 days, then 2 in the morning for 3 days, and then 1 in the morning for 3 days. -     cyclobenzaprine (FLEXERIL) 10 MG tablet; Take 0.5-1 tablets (5-10 mg total) by mouth 3 (three) times daily as needed. -     Ambulatory referral to Neurosurgery    The patient is advised to call or return to clinic if she does not see an improvement in symptoms, or to seek the care of the closest emergency department if she worsens with the above plan.   Deliah BostonMichael Sencere Symonette, MHS, PA-C [facility] at [facility] 10/16/2017 6:13 PM

## 2017-10-17 LAB — URINE CULTURE

## 2017-10-23 ENCOUNTER — Encounter: Payer: Self-pay | Admitting: Physician Assistant

## 2017-10-23 ENCOUNTER — Other Ambulatory Visit: Payer: Self-pay

## 2017-10-23 ENCOUNTER — Ambulatory Visit: Payer: BLUE CROSS/BLUE SHIELD | Admitting: Physician Assistant

## 2017-10-23 VITALS — BP 114/72 | HR 86 | Temp 97.8°F | Resp 18 | Ht 64.33 in | Wt 175.4 lb

## 2017-10-23 DIAGNOSIS — I10 Essential (primary) hypertension: Secondary | ICD-10-CM

## 2017-10-23 DIAGNOSIS — M5442 Lumbago with sciatica, left side: Secondary | ICD-10-CM | POA: Diagnosis not present

## 2017-10-23 MED ORDER — GABAPENTIN 100 MG PO CAPS: ORAL_CAPSULE | ORAL | 0 refills | Status: DC

## 2017-10-23 NOTE — Patient Instructions (Addendum)
We are going to attempt to treat your nerve pain with another medication. Please take as prescribed.  Below some educational information about the medication.  I have also given educational material on stretching.  Please perform stretches as tolerated.  Also recommend applying a heating pad to the affected area 4-5 times a day return at that time. Please follow up with neurology as planned. Return here if symptoms worsen.  Sciatica Sciatica is pain, numbness, weakness, or tingling along your sciatic nerve. The sciatic nerve starts in the lower back and goes down the back of each leg. Sciatica happens when this nerve is pinched or has pressure put on it. Sciatica usually goes away on its own or with treatment. Sometimes, sciatica may keep coming back (recur). Follow these instructions at home: Medicines  Take over-the-counter and prescription medicines only as told by your doctor.  Do not drive or use heavy machinery while taking prescription pain medicine. Managing pain  If directed, put ice on the affected area. ? Put ice in a plastic bag. ? Place a towel between your skin and the bag. ? Leave the ice on for 20 minutes, 2-3 times a day.  After icing, apply heat to the affected area before you exercise or as often as told by your doctor. Use the heat source that your doctor tells you to use, such as a moist heat pack or a heating pad. ? Place a towel between your skin and the heat source. ? Leave the heat on for 20-30 minutes. ? Remove the heat if your skin turns bright red. This is especially important if you are unable to feel pain, heat, or cold. You may have a greater risk of getting burned. Activity  Return to your normal activities as told by your doctor. Ask your doctor what activities are safe for you. ? Avoid activities that make your sciatica worse.  Take short rests during the day. Rest in a lying or standing position. This is usually better than sitting to rest. ? When you rest  for a long time, do some physical activity or stretching between periods of rest. ? Avoid sitting for a long time without moving. Get up and move around at least one time each hour.  Exercise and stretch regularly, as told by your doctor.  Do not lift anything that is heavier than 10 lb (4.5 kg) while you have symptoms of sciatica. ? Avoid lifting heavy things even when you do not have symptoms. ? Avoid lifting heavy things over and over.  When you lift objects, always lift in a way that is safe for your body. To do this, you should: ? Bend your knees. ? Keep the object close to your body. ? Avoid twisting. General instructions  Use good posture. ? Avoid leaning forward when you are sitting. ? Avoid hunching over when you are standing.  Stay at a healthy weight.  Wear comfortable shoes that support your feet. Avoid wearing high heels.  Avoid sleeping on a mattress that is too soft or too hard. You might have less pain if you sleep on a mattress that is firm enough to support your back.  Keep all follow-up visits as told by your doctor. This is important. Contact a doctor if:  You have pain that: ? Wakes you up when you are sleeping. ? Gets worse when you lie down. ? Is worse than the pain you have had in the past. ? Lasts longer than 4 weeks.  You lose weight  for without trying. Get help right away if:  You cannot control when you pee (urinate) or poop (have a bowel movement).  You have weakness in any of these areas and it gets worse. ? Lower back. ? Lower belly (pelvis). ? Butt (buttocks). ? Legs.  You have redness or swelling of your back.  You have a burning feeling when you pee. This information is not intended to replace advice given to you by your health care provider. Make sure you discuss any questions you have with your health care provider. Document Released: 08/16/2008 Document Revised: 04/14/2016 Document Reviewed: 07/17/2015 Elsevier Interactive Patient  Education  2018 [employer]   Sciatica Rehab Ask your health care provider which exercises are safe for you. Do exercises exactly as told by your health care provider and adjust them as directed. It is normal to feel mild stretching, pulling, tightness, or discomfort as you do these exercises, but you should stop right away if you feel sudden pain or your pain gets worse.Do not begin these exercises until told by your health care provider. Stretching and range of motion exercises These exercises warm up your muscles and joints and improve the movement and flexibility of your hips and your back. These exercises also help to relieve pain, numbness, and tingling. Exercise A: Sciatic nerve glide 1. Sit in a chair with your head facing down toward your chest. Place your hands behind your back. Let your shoulders slump forward. 2. Slowly straighten one of your knees while you tilt your head back as if you are looking toward the ceiling. Only straighten your leg as far as you can without making your symptoms worse. 3. Hold for __________ seconds. 4. Slowly return to the starting position. 5. Repeat with your other leg. Repeat __________ times. Complete this exercise __________ times a day. Exercise B: Knee to chest with hip adduction and internal rotation  1. Lie on your back on a firm surface with both legs straight. 2. Bend one of your knees and move it up toward your chest until you feel a gentle stretch in your lower back and buttock. Then, move your knee toward the shoulder that is on the opposite side from your leg. ? Hold your leg in this position by holding onto the front of your knee. 3. Hold for __________ seconds. 4. Slowly return to the starting position. 5. Repeat with your other leg. Repeat __________ times. Complete this exercise __________ times a day. Exercise C: Prone extension on elbows  1. Lie on your abdomen on a firm surface. A bed may be too soft for this  exercise. 2. Prop yourself up on your elbows. 3. Use your arms to help lift your chest up until you feel a gentle stretch in your abdomen and your lower back. ? This will place some of your body weight on your elbows. If this is uncomfortable, try stacking pillows under your chest. ? Your hips should stay down, against the surface that you are lying on. Keep your hip and back muscles relaxed. 4. Hold for __________ seconds. 5. Slowly relax your upper body and return to the starting position. Repeat __________ times. Complete this exercise __________ times a day. Strengthening exercises These exercises build strength and endurance in your back. Endurance is the ability to use your muscles for a long time, even after they get tired. Exercise D: Pelvic tilt 1. Lie on your back on a firm surface. Bend your knees and keep your feet flat. 2. Tense your  abdominal muscles. Tip your pelvis up toward the ceiling and flatten your lower back into the floor. ? To help with this exercise, you may place a small towel under your lower back and try to push your back into the towel. 3. Hold for __________ seconds. 4. Let your muscles relax completely before you repeat this exercise. Repeat __________ times. Complete this exercise __________ times a day. Exercise E: Alternating arm and leg raises  1. Get on your hands and knees on a firm surface. If you are on a hard floor, you may want to use padding to cushion your knees, such as an exercise mat. 2. Line up your arms and legs. Your hands should be below your shoulders, and your knees should be below your hips. 3. Lift your left leg behind you. At the same time, raise your right arm and straighten it in front of you. ? Do not lift your leg higher than your hip. ? Do not lift your arm higher than your shoulder. ? Keep your abdominal and back muscles tight. ? Keep your hips facing the ground. ? Do not arch your back. ? Keep your balance carefully, and do not  hold your breath. 4. Hold for __________ seconds. 5. Slowly return to the starting position and repeat with your right leg and your left arm. Repeat __________ times. Complete this exercise __________ times a day. Posture and body mechanics  Body mechanics refers to the movements and positions of your body while you do your daily activities. Posture is part of body mechanics. Good posture and healthy body mechanics can help to relieve stress in your body's tissues and joints. Good posture means that your spine is in its natural S-curve position (your spine is neutral), your shoulders are pulled back slightly, and your head is not tipped forward. The following are general guidelines for applying improved posture and body mechanics to your everyday activities. Standing   When standing, keep your spine neutral and your feet about hip-width apart. Keep a slight bend in your knees. Your ears, shoulders, and hips should line up.  When you do a task in which you stand in one place for a long time, place one foot up on a stable object that is 2-4 inches (5-10 cm) high, such as a footstool. This helps keep your spine neutral. Sitting   When sitting, keep your spine neutral and keep your feet flat on the floor. Use a footrest, if necessary, and keep your thighs parallel to the floor. Avoid rounding your shoulders, and avoid tilting your head forward.  When working at a desk or a computer, keep your desk at a height where your hands are slightly lower than your elbows. Slide your chair under your desk so you are close enough to maintain good posture.  When working at a computer, place your monitor at a height where you are looking straight ahead and you do not have to tilt your head forward or downward to look at the screen. Resting   When lying down and resting, avoid positions that are most painful for you.  If you have pain with activities such as sitting, bending, stooping, or squatting  (flexion-based activities), lie in a position in which your body does not bend very much. For example, avoid curling up on your side with your arms and knees near your chest (fetal position).  If you have pain with activities such as standing for a long time or reaching with your arms (extension-based activities), lie  with your spine in a neutral position and bend your knees slightly. Try the following positions: ? Lying on your side with a pillow between your knees. ? Lying on your back with a pillow under your knees. Lifting   When lifting objects, keep your feet at least shoulder-width apart and tighten your abdominal muscles.  Bend your knees and hips and keep your spine neutral. It is important to lift using the strength of your legs, not your back. Do not lock your knees straight out.  Always ask for help to lift heavy or awkward objects. This information is not intended to replace advice given to you by your health care provider. Make sure you discuss any questions you have with your health care provider. Document Released: 11/07/2005 Document Revised: 07/14/2016 Document Reviewed: 07/24/2015 Elsevier Interactive Patient Education  2018 [employer]   IF you received an x-ray today, you will receive an invoice from [facility]. Please contact [facility] at [phone] with questions or concerns regarding your invoice.   IF you received labwork today, you will receive an invoice from [facility]. Please contact [facility] at [phone] with questions or concerns regarding your invoice.   Our billing staff will not be able to assist you with questions regarding bills from these companies.  You will be contacted with the lab results as soon as they are available. The fastest way to get your results is to activate your My Chart account. Instructions are located on the last page of this paperwork. If you have not heard from us regarding the results in 2 weeks, please  contact this office.

## 2017-10-23 NOTE — Progress Notes (Signed)
MRN: [id number] DOB: 1951-05-08  Subjective:   Morgan Ruiz is a 66 y.o. female presenting for follow up on tailbone pain.  Patient originally seen on 10/10/17 for left buttocks pain.  She had denied any trauma at that time.  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  Physical exam findings showed tenderness to palpation with left paraspinal and piriformis muscles.  She was treated with Toradol and Ultram and told to return if symptoms persisted.  Her blood pressure was slightly elevated at 140/80 at that visit.  Given Rx for lisinopril 20 mg.  Patient then followed up on 10/16/17 with continued and worsening pain.  At that time she felt like the pain was shooting all the way down her leg.  Plain films showed multilevel degenerative disc disease.  She was treated with prednisone and Flexeril and given referral to neurosurgery.  Patient returns today because she is still in a great deal of pain.  Notes mild improvement with the prednisone.  She notes the pain starts in her left buttocks and shoots all the way down her lower extremity.  Pain is worsened by movement and applied pressure when sitting.  She also has some numbness and weakness in the left lower extremity.  Denies tingling, bladder/bowel incontinence, and hematuria.  Of note, patient admits to having 2 similar episodes in the past years ago.  She has also tried some stretching and heating pad, which really help.  In terms of high blood pressure, patient notes she is doing well on the lisinopril.  Denies lightheadedness, dizziness, chronic headache, double vision, chest pain, shortness of breath, heart racing, palpitations, nausea, vomiting, abdominal pain, hematuria, lower leg swelling.   Morgan Ruiz has a current medication list which includes the following prescription(s): cyclobenzaprine, lisinopril, prednisone, and tramadol. Also is allergic to vicodin [hydrocodone-acetaminophen].  Morgan Ruiz   has a past medical history of Diabetes mellitus without complication (HCC), Hyperlipidemia, and Hypertension. Also  has no past surgical history on file.   Objective:   Vitals: BP 114/72 (BP Location: Left Arm, Patient Position: Sitting, Cuff Size: Normal)   Pulse 86   Temp 97.8 F (36.6 C) (Oral)   Resp 18   Ht 5' 4.33" (1.634 m)   Wt 175 lb 6.4 oz (79.6 kg)   SpO2 99%   BMI 29.80 kg/m   Physical Exam  Constitutional: She is oriented to person, place, and time. She appears well-developed and well-nourished. She appears distressed (appears uncomfortable sitting on exam table not applying full weight onto left buttocks).  HENT:  Head: Normocephalic and atraumatic.  Eyes: Conjunctivae are normal.  Neck: Normal range of motion.  Pulmonary/Chest: Effort normal.  Musculoskeletal:       Lumbar back: She exhibits tenderness (Tenderness with palpation of the left gluteal area). She exhibits normal range of motion and no bony tenderness.  Neurological: She is alert and oriented to person, place, and time. Gait (limping to avoid putting full weight on left lower extremity) abnormal.  Reflex Scores:      Patellar reflexes are 2+ on the right side and 2+ on the left side.      Achilles reflexes are 2+ on the right side and 2+ on the left side. Sensation intact in bilateral lower extremities Strength 5/5 in right lower extremity and 4/5 in left lower extremity.  +SLR in left lower extremity  Skin: Skin is warm and dry.  Psychiatric: She has a normal mood and affect.  Vitals  reviewed.   No results found for this or any previous visit (from the past 24 hour(s)).  Assessment and Plan :  This case was precepted with Dr. Leretha PolSantiago.  1. Acute left-sided low back pain with left-sided sciatica No red flags noted in hx or PE. History and PE findings consistent with sciatica.  Patient has had mild improvement with prednisone and Flexeril but is still symptomatic.  Will attempt trial of gabapentin  this time.  Given educational material on stretching.  Encouraged continued daily stretching and applying ice/heating pad.  Follow-up with neurology as planned.  Advised to return to clinic if symptoms worsen, do not improve, or as needed. - gabapentin (NEURONTIN) 100 MG capsule; Week 1: Take 1 capsule three times a day. Week 2: Take 2 capsules three times a day. Week 3: Take 3 capsules by mouth three times a day.  Dispense: 270 capsule; Refill: 0  2. Essential hypertension Well-controlled.  Continue current medication regimen. Follow up with PCP as planned.   Benjiman CoreBrittany Kala Gassmann, PA-C  Primary Care at [facility] 10/26/2017 11:52 PM

## 2017-10-23 NOTE — Progress Notes (Deleted)
   Morgan Ruiz  MRN: [id number] DOB: April 23, 1951  Subjective:  Morgan Ruiz is a 66 y.o. female seen in office today for a chief complaint of ***  Review of Systems  Patient Active Problem List   Diagnosis Date Noted  . Diabetes mellitus without complication (HCC) 01/24/2016  . Hypertension 01/24/2016  . Hyperlipidemia 01/24/2016    Current Outpatient Medications on File Prior to Visit  Medication Sig Dispense Refill  . cyclobenzaprine (FLEXERIL) 10 MG tablet Take 0.5-1 tablets (5-10 mg total) by mouth 3 (three) times daily as needed. 60 tablet 0  . lisinopril (PRINIVIL,ZESTRIL) 20 MG tablet Take 1 tablet (20 mg total) by mouth daily. 30 tablet 3  . predniSONE (DELTASONE) 20 MG tablet Take 3 in the morning for 3 days, then 2 in the morning for 3 days, and then 1 in the morning for 3 days. 18 tablet 0  . traMADol (ULTRAM) 50 MG tablet Take 1 tablet (50 mg total) by mouth every 8 (eight) hours as needed. 15 tablet 0   No current facility-administered medications on file prior to visit.     Allergies  Allergen Reactions  . Vicodin [Hydrocodone-Acetaminophen] Rash     Objective:  There were no vitals taken for this visit.  Physical Exam  Assessment and Plan :  *** There are no diagnoses linked to this encounter.   [name] PA-C  [facility] at [facility] 10/23/2017 1:52 PM

## 2017-10-26 ENCOUNTER — Encounter: Payer: Self-pay | Admitting: Physician Assistant

## 2017-11-01 ENCOUNTER — Ambulatory Visit: Payer: Self-pay | Admitting: *Deleted

## 2017-11-01 ENCOUNTER — Encounter (HOSPITAL_COMMUNITY): Payer: Self-pay

## 2017-11-01 DIAGNOSIS — Z87891 Personal history of nicotine dependence: Secondary | ICD-10-CM | POA: Insufficient documentation

## 2017-11-01 DIAGNOSIS — Z79899 Other long term (current) drug therapy: Secondary | ICD-10-CM | POA: Insufficient documentation

## 2017-11-01 DIAGNOSIS — E119 Type 2 diabetes mellitus without complications: Secondary | ICD-10-CM | POA: Insufficient documentation

## 2017-11-01 DIAGNOSIS — M5442 Lumbago with sciatica, left side: Secondary | ICD-10-CM | POA: Insufficient documentation

## 2017-11-01 DIAGNOSIS — I10 Essential (primary) hypertension: Secondary | ICD-10-CM | POA: Insufficient documentation

## 2017-11-01 NOTE — Telephone Encounter (Signed)
Pt is complaining lower back pain; she also has left leg and sciatic pain; pt has seen 3 providers and gets no relief from the medication that is prescribed (cyclobenzaprine, gabapentin and ultram); pt was seen 10/10/17, 10/16/17, and 10/23/17;  pt triaged per nurse protocol and refuses to see a primary care provider or go to urgent care or ED;pt would like to see a specialist; spoke with Luanne BrasLizzie at [facility]; she will route this to the provider high priority to see if pt can be referred to a specialist; pt can be reached at [phone]; pt given this information and is very upset because of her ongoing unresolved pain.  Reason for Disposition . [1] SEVERE back pain (e.g., excruciating, unable to do any normal activities) AND [2] not improved 2 hours after pain medicine  Answer Assessment - Initial Assessment Questions 1. ONSET: "When did the pain begin?"      1st md appt 10/10/17 2. LOCATION: "Where does it hurt?" (upper, mid or lower back)     Lower back 3. SEVERITY: "How bad is the pain?"  (e.g., Scale 1-10; mild, moderate, or severe)   - MILD (1-3): doesn't interfere with normal activities    - MODERATE (4-7): interferes with normal activities or awakens from sleep    - SEVERE (8-10): excruciating pain, unable to do any normal activities      severe 4. PATTERN: "Is the pain constant?" (e.g., yes, no; constant, intermittent)      constant 5. RADIATION: "Does the pain shoot into your legs or elsewhere?"     Left side and leg 6. CAUSE:  "What do you think is causing the back pain?"      unknown 7. BACK OVERUSE:  "Any recent lifting of heavy objects, strenuous work or exercise?"     no 8. MEDICATIONS: "What have you taken so far for the pain?" (e.g., nothing, acetaminophen, NSAIDS)  9. NEUROLOGIC SYMPTOMS: "Do you have any weakness, numbness, or problems with bowel/bladder control?"     weakness 10. OTHER SYMPTOMS: "Do you have any other symptoms?" (e.g., fever, abdominal pain, burning with  urination, blood in urine)       no 11. PREGNANCY: "Is there any chance you are pregnant?" (e.g., yes, no; LMP)       no  Protocols used: BACK PAIN-A-AH

## 2017-11-01 NOTE — ED Triage Notes (Signed)
Pt is alert and oriented x 4 and is verbally responsive. Pt reports that she had a fall on Sept 29. Pt states that she has been having pain to her left leg that shoots to buttocks. Pt reports that pain is10 /10 and she is not able to stand, walk, sleep or work as pain has been so severe Pt reports that she has some numbness to her ankle and foot. Pt is emotional and is escorted with husband. Pt has neuro appt she has to f/u  .

## 2017-11-02 ENCOUNTER — Emergency Department (HOSPITAL_COMMUNITY)
Admission: EM | Admit: 2017-11-02 | Discharge: 2017-11-02 | Disposition: A | Discharge status: Home / Self Care | Payer: PRIVATE HEALTH INSURANCE | Attending: Emergency Medicine | Admitting: Emergency Medicine

## 2017-11-02 DIAGNOSIS — M5442 Lumbago with sciatica, left side: Secondary | ICD-10-CM

## 2017-11-02 MED ORDER — OXYCODONE-ACETAMINOPHEN 5-325 MG PO TABS
1.0000 | ORAL_TABLET | Freq: Once | ORAL | Status: AC
  Administered 2017-11-02: 1 via ORAL
  Filled 2017-11-02: qty 1

## 2017-11-02 MED ORDER — METHOCARBAMOL 500 MG PO TABS: 500.0000 mg | ORAL_TABLET | Freq: Two times a day (BID) | ORAL | 0 refills | Status: DC | PRN

## 2017-11-02 MED ORDER — GI COCKTAIL ~~LOC~~: 30.0000 mL | Freq: Once | ORAL | Status: DC

## 2017-11-02 MED ORDER — LIDOCAINE 5 % EX PTCH: 1.0000 | MEDICATED_PATCH | CUTANEOUS | 1 refills | Status: DC

## 2017-11-02 MED ORDER — LIDOCAINE 5 % EX PTCH
1.0000 | MEDICATED_PATCH | CUTANEOUS | Status: DC
  Administered 2017-11-02: 1 via TRANSDERMAL
  Filled 2017-11-02: qty 1

## 2017-11-02 MED ORDER — PREDNISONE 10 MG PO TABS: ORAL_TABLET | ORAL | 0 refills | Status: DC

## 2017-11-02 MED ORDER — METHOCARBAMOL 1000 MG/10ML IJ SOLN
1000.0000 mg | Freq: Once | INTRAVENOUS | Status: AC
  Administered 2017-11-02: 1000 mg via INTRAVENOUS
  Filled 2017-11-02: qty 10

## 2017-11-02 MED ORDER — KETOROLAC TROMETHAMINE 30 MG/ML IJ SOLN
15.0000 mg | Freq: Once | INTRAMUSCULAR | Status: AC
  Administered 2017-11-02: 15 mg via INTRAVENOUS
  Filled 2017-11-02: qty 1

## 2017-11-02 MED ORDER — GI COCKTAIL ~~LOC~~
30.0000 mL | Freq: Once | ORAL | Status: AC
  Administered 2017-11-02: 30 mL via ORAL
  Filled 2017-11-02: qty 30

## 2017-11-02 MED ORDER — METHOCARBAMOL 1000 MG/10ML IJ SOLN: 1000.0000 mg | Freq: Once | INTRAMUSCULAR | Status: DC

## 2017-11-02 MED ORDER — DEXAMETHASONE SODIUM PHOSPHATE 10 MG/ML IJ SOLN
4.0000 mg | Freq: Once | INTRAMUSCULAR | Status: AC
  Administered 2017-11-02: 4 mg via INTRAVENOUS
  Filled 2017-11-02: qty 1

## 2017-11-02 NOTE — Telephone Encounter (Signed)
Pt has seen many providers in our practice. Please advise.

## 2017-11-02 NOTE — Telephone Encounter (Signed)
Patient was referred to Neurosurgery at her 10/16/2017 visit. Chart review reveals that information was sent to [facility] on 12/05. She is welcome to contact that office directly to schedule, presuming that she hasn't heard from them yet, or return to our office for additional treatment. I would recommend that she schedule with a provider who has already seen her for this problem.

## 2017-11-02 NOTE — ED Provider Notes (Signed)
[facility]-EMERGENCY DEPT Provider Note   CSN: [id number] Arrival date & time: 11/01/17  2105     History   Chief Complaint Chief Complaint  Patient presents with  . Leg Pain    HPI Morgan Ruiz is a 66 y.o. female w PMHx controlled T2DM, HTN, HLD, presenting to ED for persistent left lower back pain with radiation down left leg since a mechanical fall on 08/19/17.  Has been seen multiple times by her primary care provider at [facility], with prescriptions for cyclobenzaprine, gabapentin, and prednisone without relief. Normal L-spine xray. States pain is in her left lower back radiating down the posterior left leg described as sharp and stabbing.  Pain is worse with any movement or palpation. Assoc tingling sensation in left ankle/foot. Denies weakness, fever, bowel or bladder incontinence, history of cancer or IV drug use.  Not seen orthopedics or neurology for her symptoms.   The history is provided by the patient.    Past Medical History:  Diagnosis Date  . Diabetes mellitus without complication (HCC)   . Hyperlipidemia   . Hypertension     Patient Active Problem List   Diagnosis Date Noted  . Diabetes mellitus without complication (HCC) 01/24/2016  . Hypertension 01/24/2016  . Hyperlipidemia 01/24/2016    History reviewed. No pertinent surgical history.  OB History    No data available       Home Medications    Prior to Admission medications   Medication Sig Start Date End Date Taking? Authorizing Provider  cyclobenzaprine (FLEXERIL) 10 MG tablet Take 0.5-1 tablets (5-10 mg total) by mouth 3 (three) times daily as needed. 10/16/17   Ofilia Neaslark, Michael L, PA-C  gabapentin (NEURONTIN) 100 MG capsule Week 1: Take 1 capsule three times a day. Week 2: Take 2 capsules three times a day. Week 3: Take 3 capsules by mouth three times a day. 10/23/17   Benjiman CoreWiseman, Brittany D, PA-C  lidocaine (LIDODERM) 5 % Place 1 patch onto the skin daily. Remove & Discard  patch within 12 hours or as directed by MD 11/02/17   Robinson, [place] N, PA-C  lisinopril (PRINIVIL,ZESTRIL) 20 MG tablet Take 1 tablet (20 mg total) by mouth daily. 10/10/17   Myles LippsSantiago, Irma M, MD  methocarbamol (ROBAXIN) 500 MG tablet Take 1 tablet (500 mg total) by mouth 2 (two) times daily as needed for muscle spasms. 11/02/17   Robinson, [place] N, PA-C  predniSONE (DELTASONE) 10 MG tablet Take 6 tablets for 2 days, then 4 tablets for 2 days, then 2 tablets for 2 days, then 1 tablet for 2 days. 11/02/17   Robinson, [place] N, PA-C  traMADol (ULTRAM) 50 MG tablet Take 1 tablet (50 mg total) by mouth every 8 (eight) hours as needed. 10/10/17   Myles LippsSantiago, Irma M, MD    Family History Family History  Problem Relation Age of Onset  . Diabetes type II Mother   . Heart attack Mother   . Liver cancer Father   . Heart attack Brother     Social History Social History   Tobacco Use  . Smoking status: Former Smoker    Last attempt to quit: 11/21/2012    Years since quitting: 4.9  . Smokeless tobacco: Never Used  . Tobacco comment: social smoker  Substance Use Topics  . Alcohol use: No    Alcohol/week: 0.0 oz  . Drug use: No     Allergies   Vicodin [hydrocodone-acetaminophen]   Review of Systems Review of Systems  Gastrointestinal:  Negative for abdominal pain.       No bowel incontinence  Genitourinary: Negative for difficulty urinating.  Musculoskeletal: Positive for back pain and myalgias.  Neurological: Negative for weakness and numbness.  All other systems reviewed and are negative.    Physical Exam Updated Vital Signs BP (!) 139/93 (BP Location: Left Arm)   Pulse 86   Temp (!) 92.8 F (33.8 C) (Oral)   Resp 18   SpO2 99%   Physical Exam  Constitutional: She appears well-developed and well-nourished.  Appears uncomfortable.  HENT:  Head: Normocephalic and atraumatic.  Eyes: Conjunctivae are normal.  Neck: Normal range of motion. Neck supple.  Cardiovascular:  Normal rate, regular rhythm, normal heart sounds and intact distal pulses.  Pulmonary/Chest: Effort normal and breath sounds normal.  Abdominal: Soft. Bowel sounds are normal. There is no tenderness.  Musculoskeletal:  Left lateral lower back and left gluteus with TTP. TTP extending down entire posterior left leg. Normal ROM, positive straight leg raise. No spinal or paraspinal tenderness, no bony step-offs, no gross deformities.   Neurological: She is alert.  Motor:  Normal tone. 5/5 in lower extremities bilaterally including strong and equal dorsiflexion/plantar flexion Sensory: Pinprick and light touch normal in all extremities.  Deep Tendon Reflexes: 2+ and symmetric in the patella Gait: normal gait and balance CV: distal pulses palpable throughout    Skin: Skin is warm.  Psychiatric: She has a normal mood and affect. Her behavior is normal.  Nursing note and vitals reviewed.    ED Treatments / Results  Labs (all labs ordered are listed, but only abnormal results are displayed) Labs Reviewed - No data to display  EKG  EKG Interpretation None       Radiology No results found.  Procedures Procedures (including critical care time)  Medications Ordered in ED Medications  lidocaine (LIDODERM) 5 % 1 patch (1 patch Transdermal Patch Applied 11/02/17 0745)  methocarbamol (ROBAXIN) 1,000 mg in dextrose 5 % 50 mL IVPB (1,000 mg Intravenous New Bag/Given 11/02/17 0805)  oxyCODONE-acetaminophen (PERCOCET/ROXICET) 5-325 MG per tablet 1 tablet (1 tablet Oral Given 11/02/17 0557)  dexamethasone (DECADRON) injection 4 mg (4 mg Intravenous Given 11/02/17 0728)  ketorolac (TORADOL) 30 MG/ML injection 15 mg (15 mg Intravenous Given 11/02/17 0728)  gi cocktail (Maalox,Lidocaine,Donnatal) (30 mLs Oral Given 11/02/17 0727)     Initial Impression / Assessment and Plan / ED Course  I have reviewed the triage vital signs and the nursing notes.  Pertinent labs & imaging results that were  available during my care of the patient were reviewed by me and considered in my medical decision making (see chart for details).    Pt with symptoms consistent with sciatica. Normal neurological exam, no evidence of urinary incontinence or retention, pain is consistently reproducible. There is no evidence of AAA or concern for dissection at this time.   Patient can walk but states is painful.  No loss of bowel or bladder control.  No concern for cauda equina.  No fever, night sweats, weight loss, h/o cancer, IVDU.  Pain treated here in the department with adequate improvement. RICE protocol and pain medicine indicated and discussed with patient. Will refer to orthopedics for follow up. I have also discussed reasons to return immediately to the ER.  Patient expresses understanding and agrees with plan.  Patient discussed with Dr. Nicanor Alcon, who agrees with care plan.  Discussed results, findings, treatment and follow up. Patient advised of return precautions. Patient verbalized understanding and agreed with plan.  Final Clinical Impressions(s) / ED Diagnoses   Final diagnoses:  Acute left-sided low back pain with left-sided sciatica    ED Discharge Orders        Ordered    predniSONE (DELTASONE) 10 MG tablet     11/02/17 0819    lidocaine (LIDODERM) 5 %  Every 24 hours     11/02/17 0819    methocarbamol (ROBAXIN) 500 MG tablet  2 times daily PRN     11/02/17 0819       Robinson, [place]azil[place] N, PA-C 11/02/17 [id number]    Palumbo, April, MD 11/07/17 2314

## 2017-11-02 NOTE — Discharge Instructions (Signed)
Please read instructions below.  Talk with your PCP about any new medications.  You can take tramadol as prescribed for pain.  You can take robaxin every 12 hours as needed for muscle spasm and additional relief. You can apply a new lidoderm patch to your lower back every 24 hours. Apply heat to your lower back and buttock.  Begin taking the prednisone, starting tomorrow, as prescribed. Schedule an appointment with the orthopedic specialist to follow up on ongoing back pain. Return to ER if new numbness or tingling in your arms or legs, inability to urinate, inability to hold your bowels, or weakness in your extremities.

## 2017-11-13 ENCOUNTER — Other Ambulatory Visit: Payer: Self-pay | Admitting: Physician Assistant

## 2017-11-13 DIAGNOSIS — I10 Essential (primary) hypertension: Secondary | ICD-10-CM

## 2018-02-06 ENCOUNTER — Other Ambulatory Visit: Payer: Self-pay | Admitting: Family Medicine

## 2018-02-06 NOTE — Telephone Encounter (Signed)
Lisinopril refill Last OV: 10/10/17 Last Refill:10/10/17 Pharmacy:[facility] [address]

## 2018-05-01 DIAGNOSIS — G5603 Carpal tunnel syndrome, bilateral upper limbs: Secondary | ICD-10-CM | POA: Diagnosis not present

## 2018-05-12 ENCOUNTER — Other Ambulatory Visit: Payer: Self-pay

## 2018-05-12 ENCOUNTER — Other Ambulatory Visit: Payer: Self-pay | Admitting: Physician Assistant

## 2018-05-12 ENCOUNTER — Telehealth: Payer: Self-pay | Admitting: Family Medicine

## 2018-05-12 MED ORDER — LISINOPRIL 20 MG PO TABS: 20.0000 mg | ORAL_TABLET | Freq: Every day | ORAL | 0 refills | Status: DC

## 2018-05-12 NOTE — Telephone Encounter (Signed)
I rescheduled pt for 05/15/18 with Weber, she would like 3 pills to cover her until her app.

## 2018-05-12 NOTE — Telephone Encounter (Signed)
Pt would like 6 pills of lisinopril (PRINIVIL,ZESTRIL) 20 MG tablet [[id number]][[id number]] [facility] [address] [place] - [id number] to cover her until her upcoming appointment on 05/18/18 with [place].

## 2018-05-12 NOTE — Telephone Encounter (Signed)
Appt 05/15/2018 - pt req Lisinopril -enough until appt  Sent to pharmacy.

## 2018-05-15 ENCOUNTER — Other Ambulatory Visit: Payer: Self-pay

## 2018-05-15 ENCOUNTER — Encounter: Payer: Self-pay | Admitting: Physician Assistant

## 2018-05-15 ENCOUNTER — Ambulatory Visit (INDEPENDENT_AMBULATORY_CARE_PROVIDER_SITE_OTHER): Payer: Medicare Other | Admitting: Physician Assistant

## 2018-05-15 VITALS — BP 110/62 | HR 83 | Temp 99.1°F | Resp 18 | Ht 64.33 in | Wt 177.2 lb

## 2018-05-15 DIAGNOSIS — I10 Essential (primary) hypertension: Secondary | ICD-10-CM | POA: Diagnosis not present

## 2018-05-15 DIAGNOSIS — M5137 Other intervertebral disc degeneration, lumbosacral region: Secondary | ICD-10-CM | POA: Diagnosis not present

## 2018-05-15 MED ORDER — OLMESARTAN MEDOXOMIL 20 MG PO TABS: 20.0000 mg | ORAL_TABLET | Freq: Every day | ORAL | 0 refills | Status: DC

## 2018-05-15 MED ORDER — CYCLOBENZAPRINE HCL 10 MG PO TABS: 5.0000 mg | ORAL_TABLET | Freq: Three times a day (TID) | ORAL | 0 refills | Status: DC | PRN

## 2018-05-15 MED ORDER — MELOXICAM 7.5 MG PO TABS: 7.5000 mg | ORAL_TABLET | Freq: Every day | ORAL | 0 refills | Status: DC

## 2018-05-15 NOTE — Progress Notes (Signed)
Morgan Ruiz  MRN: [id number] DOB: [birth year]  PCP: Morrell Riddle, PA-C  Chief Complaint  Patient presents with  . Medication Refill    all meds     Subjective:  Patient presents for evaluation of medication refills. She affirms home blood pressure monitoring. This morning bp was 148/68, and she states normally systolic has been ranging from the 130's - 140's. She also notes increased stress and passing of family members has historically caused elevation of blood pressure.  She has been doing research and is is worried about being on lisinopril - she is unable to tell me today why she is concerned about the lisinopril but she would like to discuss switching to a new medications.  Of note, she had back surgery 12/18. She has received Tramadol, Robaxin, and Flexeril from the ED or orthopedics. She states her back is significantly better with intermittent pain associated mostly with weather changes.  She would like a muscle relaxer and something to help with pain when she is having it.  She does admit to this being rare she suspects.   History is obtained by patient.  Review of Systems  Constitutional: Negative for chills and fever.  Eyes: Negative for pain and visual disturbance.  Respiratory: Negative for cough, chest tightness and shortness of breath.   Cardiovascular: Negative for chest pain, palpitations and leg swelling.  Genitourinary: Negative for difficulty urinating, dysuria and hematuria.  Neurological: Negative for dizziness, light-headedness, numbness and headaches.    Patient Active Problem List   Diagnosis Date Noted  . Diabetes mellitus without complication (HCC) 01/24/2016  . Hypertension 01/24/2016  . Hyperlipidemia 01/24/2016    Current Outpatient Medications on File Prior to Visit  Medication Sig Dispense Refill  . lidocaine (LIDODERM) 5 % Place 1 patch onto the skin daily. Remove & Discard patch within 12 hours or as directed by MD 6 patch 1   No current  facility-administered medications on file prior to visit.     Allergies  Allergen Reactions  . Vicodin [Hydrocodone-Acetaminophen] Rash    Past Medical History:  Diagnosis Date  . Diabetes mellitus without complication (HCC)   . Hyperlipidemia   . Hypertension    Social History   Social History Narrative   From [place] - to [place] in 1999   Married    daughter and 2 grandchild   Social History   Tobacco Use  . Smoking status: Former Smoker    Last attempt to quit: 11/21/2012    Years since quitting: 5.4  . Smokeless tobacco: Never Used  . Tobacco comment: social smoker  Substance Use Topics  . Alcohol use: No    Alcohol/week: 0.0 oz  . Drug use: No   family history includes Diabetes type II in her mother; Heart attack in her brother and mother; Liver cancer in her father.     Objective:  BP 110/62   Pulse 83   Temp 99.1 F (37.3 C) (Oral)   Resp 18   Ht 5' 4.33" (1.634 m)   Wt 177 lb 3.2 oz (80.4 kg)   SpO2 96%   BMI 30.11 kg/m  Body mass index is 30.11 kg/m.  Wt Readings from Last 3 Encounters:  05/15/18 177 lb 3.2 oz (80.4 kg)  10/23/17 175 lb 6.4 oz (79.6 kg)  10/16/17 176 lb 12.8 oz (80.2 kg)    Physical Exam  Constitutional: She is oriented to person, place, and time. She appears well-developed and well-nourished. No distress.  HENT:  Head:  Normocephalic and atraumatic.  Right Ear: Hearing and external ear normal.  Left Ear: Hearing and external ear normal.  Eyes: Pupils are equal, round, and reactive to light. Conjunctivae are normal.  Neck: Normal range of motion. Neck supple. No thyromegaly present.  Cardiovascular: Normal rate, regular rhythm and normal heart sounds.  No murmur heard. Pulmonary/Chest: Effort normal and breath sounds normal. No respiratory distress. She has no wheezes.  Lymphadenopathy:    She has no cervical adenopathy.  Neurological: She is alert and oriented to person, place, and time.  Skin: Skin is warm and dry. No pallor.   Psychiatric: She has a normal mood and affect. Her behavior is normal. Judgment and thought content normal.  Vitals reviewed.   Assessment and Plan :  Essential hypertension - Plan: olmesartan (BENICAR) 20 MG tablet - Patient would like to discontinue lisinorpil despite blood pressure control. She states she has talked with colleauges and done research on adverse effects of this medication. She is willing to try Benicar, so will proceed with treatment and schedule follow up in 1 month to reevaluate blood pressure and collect blood for routine lab testing and get EKG at that tme..    DDD (degenerative disc disease), lumbosacral - Plan: meloxicam (MOBIC) 7.5 MG tablet, cyclobenzaprine (FLEXERIL) 10 MG tablet - Explained that Tramadol has adverse effects of addiction. Patient was agreeable to prescription of meloxicam and Flexeril as needed for back pain moving forward.   Patient verbalized to me that they understand the following: diagnosis, what is being done for them, what to expect and what should be done at home.  Their questions have been answered.  See after visit summary for patient specific instructions.  Benny LennertSarah Baptiste Littler PA-C  Primary Care at [facility] 05/17/2018 1:39 AM  Please note: Portions of this report may have been transcribed using dragon voice recognition software. Every effort was made to ensure accuracy; however, inadvertent computerized transcription errors may be present.

## 2018-05-15 NOTE — Patient Instructions (Addendum)
We will change your blood pressure medication since that makes you more comfortable.    IF you received an x-ray today, you will receive an invoice from [facility]. Please contact [facility] at [phone] with questions or concerns regarding your invoice.   IF you received labwork today, you will receive an invoice from [facility]. Please contact [facility] at [phone] with questions or concerns regarding your invoice.   Our billing staff will not be able to assist you with questions regarding bills from these companies.  You will be contacted with the lab results as soon as they are available. The fastest way to get your results is to activate your My Chart account. Instructions are located on the last page of this paperwork. If you have not heard from us regarding the results in 2 weeks, please contact this office.

## 2018-05-17 ENCOUNTER — Encounter: Payer: Self-pay | Admitting: Physician Assistant

## 2018-05-18 ENCOUNTER — Ambulatory Visit: Payer: BLUE CROSS/BLUE SHIELD | Admitting: Family Medicine

## 2018-05-20 ENCOUNTER — Other Ambulatory Visit: Payer: Self-pay | Admitting: Physician Assistant

## 2018-06-07 ENCOUNTER — Ambulatory Visit: Payer: Medicare Other | Admitting: Physician Assistant

## 2018-06-11 ENCOUNTER — Other Ambulatory Visit: Payer: Self-pay | Admitting: Physician Assistant

## 2018-06-11 DIAGNOSIS — I10 Essential (primary) hypertension: Secondary | ICD-10-CM

## 2018-06-14 ENCOUNTER — Encounter: Payer: Self-pay | Admitting: *Deleted

## 2018-09-12 ENCOUNTER — Other Ambulatory Visit: Payer: Self-pay

## 2018-09-12 ENCOUNTER — Ambulatory Visit (INDEPENDENT_AMBULATORY_CARE_PROVIDER_SITE_OTHER): Payer: Medicare Other | Admitting: Physician Assistant

## 2018-09-12 ENCOUNTER — Encounter: Payer: Self-pay | Admitting: Physician Assistant

## 2018-09-12 VITALS — BP 103/68 | HR 86 | Temp 98.6°F | Resp 18 | Ht 64.3 in | Wt 179.8 lb

## 2018-09-12 DIAGNOSIS — I10 Essential (primary) hypertension: Secondary | ICD-10-CM | POA: Diagnosis not present

## 2018-09-12 DIAGNOSIS — R1013 Epigastric pain: Secondary | ICD-10-CM | POA: Diagnosis not present

## 2018-09-12 DIAGNOSIS — Z23 Encounter for immunization: Secondary | ICD-10-CM | POA: Diagnosis not present

## 2018-09-12 MED ORDER — SUCRALFATE 1 GM/10ML PO SUSP: 1.0000 g | Freq: Three times a day (TID) | ORAL | 0 refills | Status: DC

## 2018-09-12 MED ORDER — OMEPRAZOLE 20 MG PO CPDR: 20.0000 mg | DELAYED_RELEASE_CAPSULE | Freq: Every day | ORAL | 0 refills | Status: DC

## 2018-09-12 MED ORDER — OMEPRAZOLE 40 MG PO CPDR: 40.0000 mg | DELAYED_RELEASE_CAPSULE | Freq: Every day | ORAL | 0 refills | Status: DC

## 2018-09-12 MED ORDER — OLMESARTAN MEDOXOMIL 20 MG PO TABS: 20.0000 mg | ORAL_TABLET | Freq: Every day | ORAL | 0 refills | Status: DC

## 2018-09-12 NOTE — Progress Notes (Signed)
Morgan Ruiz  MRN: [id number] DOB: 1951-05-09  Subjective:   Morgan Ruiz is a 67 y.o. female who presents for evaluation of intermittent abdominal pain. Onset was 7 days ago. Symptoms have been unchanged. The pain is described as aching and burning, and is 6/10 in intensity. Pain is located in the epigastric region without radiation.  Aggravating factors: eating and spicy foods.  Alleviating factors: milk and mylanta. Associated symptoms: occasional heartburn, waterbrush, and diarrhea. The patient denies anorexia, arthralagias, belching, chills, constipation, dysuria, fever, flatus, frequency, headache, hematochezia, hematuria, melena, myalgias, nausea, sweats and vomiting. Uses advil at home sometimes, not frequently. No alcohol use.  Has PMH of intermittent gastric ulcer in the past. Used to take nexium, which use to help. Has PMH of gallstones, has PSH of cholecystectomy in 2008. Does not think she has ever had an endoscopy.   As I was leaving, patient mentions she needs her BP medications refilled.  She is almost off.  She is not checking her blood pressure outside the office.  Denies any symptoms.   Review of Systems  Constitutional: Negative for chills, diaphoresis and unexpected weight change.  Respiratory: Negative for cough and shortness of breath.   Cardiovascular: Negative for chest pain and leg swelling.  Gastrointestinal: Negative for abdominal distention, anal bleeding and blood in stool.  Genitourinary: Negative for hematuria.  Neurological: Negative for dizziness and light-headedness.      Patient Active Problem List   Diagnosis Date Noted  . Diabetes mellitus without complication ([place]) 67/20/9470  . Hypertension 01/24/2016  . Hyperlipidemia 01/24/2016    Current Outpatient Medications on File Prior to Visit  Medication Sig Dispense Refill  . cyclobenzaprine (FLEXERIL) 10 MG tablet Take 0.5-1 tablets (5-10 mg total) by mouth 3 (three) times daily as needed. 60  tablet 0  . lidocaine (LIDODERM) 5 % Place 1 patch onto the skin daily. Remove & Discard patch within 12 hours or as directed by MD 6 patch 1  . meloxicam (MOBIC) 7.5 MG tablet Take 1-2 tablets (7.5-15 mg total) by mouth daily. 30 tablet 0  . olmesartan (BENICAR) 20 MG tablet TAKE ONE TABLET BY MOUTH DAILY 30 tablet 3   No current facility-administered medications on file prior to visit.     Allergies  Allergen Reactions  . Vicodin [Hydrocodone-Acetaminophen] Rash     Objective:  BP 103/68   Pulse 86   Temp 98.6 F (37 C) (Oral)   Resp 18   Ht 5' 4.3" (1.633 m)   Wt 179 lb 12.8 oz (81.6 kg)   SpO2 97%   BMI 30.58 kg/m   Physical Exam  Constitutional: She is oriented to person, place, and time. She appears well-developed and well-nourished.  Non-toxic appearance. She does not appear ill. No distress.  HENT:  Head: Normocephalic and atraumatic.  Mouth/Throat: Uvula is midline, oropharynx is clear and moist and mucous membranes are normal.  Eyes: Pupils are equal, round, and reactive to light. Conjunctivae and EOM are normal.  Neck: Normal range of motion.  Cardiovascular: Normal rate, regular rhythm, normal heart sounds and intact distal pulses.  Pulmonary/Chest: Effort normal and breath sounds normal. She has no wheezes. She has no rhonchi. She has no rales.  Abdominal: Soft. Normal appearance and bowel sounds are normal. She exhibits no distension. There is tenderness (mild TTP) in the epigastric area. There is no rigidity, no rebound, no guarding, no CVA tenderness, no tenderness at McBurney's point and negative Murphy's sign. No hernia.  Musculoskeletal:  Right lower leg: She exhibits no swelling.       Left lower leg: She exhibits no swelling.  Neurological: She is alert and oriented to person, place, and time.  Skin: Skin is warm and dry.  Psychiatric: She has a normal mood and affect.  Vitals reviewed.   Assessment and Plan :  1. Abdominal pain,  epigastric History and physical exam findings consistent with peptic ulcer disease.  She is overall well-appearing, no acute distress.  Vitals stable.  Mild TTP with palpation of epigastric region.  Labs pending.  Suggest omeprazole daily for the next 4 to 8 weeks.  If no improvement after 4 weeks, please return to office.  Can consider Carafate at that time.  Avoid provoking foods and over-the-counter NSAIDs. - CMP14+EGFR - Lipase - H. pylori breath test - omeprazole (PRILOSEC) 40 MG capsule; Take 1 capsule (40 mg total) by mouth daily.  Dispense: 60 capsule; Refill: 0  2. Essential hypertension Well-controlled.  Refills provided. - olmesartan (BENICAR) 20 MG tablet; Take 1 tablet (20 mg total) by mouth daily.  Dispense: 90 tablet; Refill: 0  3. Flu vaccine need - Flu vaccine HIGH DOSE PF (Fluzone High dose)  Tenna Delaine PA-C  Primary Care at [facility] 09/12/2018 10:59 AM

## 2018-09-12 NOTE — Patient Instructions (Addendum)
Start omeprazole 40mg  daily.  You may use over the counter maalox for symptoms after meals and at bedtime or the dose may be 150 mg three times a day, after meals. The dose should not be more than 500 mg in twenty-four hours.  If no improvement after 4 weeks, please return to office. We can consider carafate at that time.  Avoid provoking foods and over the counter NSAIDs.    If you have lab work done today you will be contacted with your lab results within the next 2 weeks.  If you have not heard from [place] then please contact us. The fastest way to get your results is to register for My Chart.   Peptic Ulcer A peptic ulcer is a painful sore in the lining of your esophagus, stomach, or the first part of your small intestine. You may have pain in the area between your chest and your belly button. The most common causes of an ulcer are:  An infection.  Using certain pain medicines too often or too much.  Follow these instructions at home:  Avoid alcohol.  Avoid caffeine.  Do not use any tobacco products. These include cigarettes, chewing tobacco, and e-cigarettes. If you need help quitting, ask your doctor.  Take over-the-counter and prescription medicines only as told by your doctor. Do not stop or change your medicines unless you talk with your doctor about it first.  Keep all follow-up visits as told by your doctor. This is important. Contact a doctor if:  You do not get better in 7 days after you start treatment.  You keep having an upset stomach (indigestion) or heartburn. Get help right away if:  You have sudden, sharp pain in your belly (abdomen).  You have lasting belly pain.  You have bloody poop (stool) or black, tarry poop.  You throw up (vomit) blood. It may look like coffee grounds.  You feel light-headed or feel like you may pass out (faint).  You get weak.  You get sweaty or feel sticky and cold to the touch (clammy). This information is not intended to  replace advice given to you by your health care provider. Make sure you discuss any questions you have with your health care provider. Document Released: 02/01/2010 Document Revised: 03/23/2016 Document Reviewed: 08/08/2015 Elsevier Interactive Patient Education  2018 [employer].   IF you received an x-ray today, you will receive an invoice from [facility]. Please contact [facility] at [phone] with questions or concerns regarding your invoice.   IF you received labwork today, you will receive an invoice from [facility]. Please contact [facility] at [phone] with questions or concerns regarding your invoice.   Our billing staff will not be able to assist you with questions regarding bills from these companies.  You will be contacted with the lab results as soon as they are available. The fastest way to get your results is to activate your My Chart account. Instructions are located on the last page of this paperwork. If you have not heard from [place] regarding the results in 2 weeks, please contact this office.

## 2018-09-13 ENCOUNTER — Other Ambulatory Visit: Payer: Self-pay | Admitting: Physician Assistant

## 2018-09-13 DIAGNOSIS — R1013 Epigastric pain: Secondary | ICD-10-CM

## 2018-09-13 DIAGNOSIS — R748 Abnormal levels of other serum enzymes: Secondary | ICD-10-CM

## 2018-09-13 LAB — CMP14+EGFR
A/G RATIO: 1.5 (ref 1.2–2.2)
ALK PHOS: 105 IU/L (ref 39–117)
ALT: 83 IU/L — ABNORMAL HIGH (ref 0–32)
AST: 101 IU/L — AB (ref 0–40)
Albumin: 4.5 g/dL (ref 3.6–4.8)
BILIRUBIN TOTAL: 0.6 mg/dL (ref 0.0–1.2)
BUN/Creatinine Ratio: 23 (ref 12–28)
BUN: 19 mg/dL (ref 8–27)
CO2: 25 mmol/L (ref 20–29)
Calcium: 10.3 mg/dL (ref 8.7–10.3)
Chloride: 99 mmol/L (ref 96–106)
Creatinine, Ser: 0.84 mg/dL (ref 0.57–1.00)
GFR calc Af Amer: 84 mL/min/{1.73_m2} (ref >59)
GFR, EST NON AFRICAN AMERICAN: 73 mL/min/{1.73_m2} (ref >59)
GLOBULIN, TOTAL: 3.1 g/dL (ref 1.5–4.5)
Glucose: 108 mg/dL — ABNORMAL HIGH (ref 65–99)
POTASSIUM: 5 mmol/L (ref 3.5–5.2)
Sodium: 138 mmol/L (ref 134–144)
Total Protein: 7.6 g/dL (ref 6.0–8.5)

## 2018-09-13 LAB — H. PYLORI BREATH TEST: H PYLORI BREATH TEST: NEGATIVE

## 2018-09-13 LAB — LIPASE: LIPASE: 41 U/L (ref 14–72)

## 2018-09-13 NOTE — Progress Notes (Signed)
Malachi Bonds spoke to lynda and will call patient to book appointment

## 2018-09-13 NOTE — Progress Notes (Signed)
Could we get it done today??

## 2018-09-13 NOTE — Progress Notes (Signed)
Spoke with pt and referrals.  Appt made for 11:30 tomorrow per pt availability for [place] - [place]. LMOVM for pt to be NPO and arrive at 11:15am.

## 2018-09-13 NOTE — Progress Notes (Signed)
Can we please call pt and let her know we need to do a stat US of her liver as her liver enzymes were elevated. Ask when she is available today, then please call and schedule the [place]. Thanks, let me know when it is.

## 2018-09-14 ENCOUNTER — Ambulatory Visit (HOSPITAL_COMMUNITY)
Admission: RE | Admit: 2018-09-14 | Discharge: 2018-09-14 | Disposition: A | Discharge status: Home / Self Care | Payer: Medicare Other | Source: Ambulatory Visit | Attending: Physician Assistant | Admitting: Physician Assistant

## 2018-09-14 ENCOUNTER — Other Ambulatory Visit: Payer: Self-pay | Admitting: Physician Assistant

## 2018-09-14 DIAGNOSIS — R748 Abnormal levels of other serum enzymes: Secondary | ICD-10-CM

## 2018-09-14 DIAGNOSIS — Z9049 Acquired absence of other specified parts of digestive tract: Secondary | ICD-10-CM | POA: Diagnosis not present

## 2018-09-14 DIAGNOSIS — K760 Fatty (change of) liver, not elsewhere classified: Secondary | ICD-10-CM | POA: Diagnosis not present

## 2018-09-14 DIAGNOSIS — R1013 Epigastric pain: Secondary | ICD-10-CM | POA: Insufficient documentation

## 2018-09-14 NOTE — Progress Notes (Signed)
Orders Placed This Encounter  Procedures  . Hepatic Function Panel    Standing Status:   Future    Standing Expiration Date:   09/18/2018

## 2018-09-17 ENCOUNTER — Ambulatory Visit (INDEPENDENT_AMBULATORY_CARE_PROVIDER_SITE_OTHER): Payer: Medicare Other | Admitting: Physician Assistant

## 2018-09-17 DIAGNOSIS — R748 Abnormal levels of other serum enzymes: Secondary | ICD-10-CM

## 2018-09-18 LAB — HEPATIC FUNCTION PANEL
ALBUMIN: 4.5 g/dL (ref 3.6–4.8)
ALT: 66 IU/L — ABNORMAL HIGH (ref 0–32)
AST: 28 IU/L (ref 0–40)
Alkaline Phosphatase: 90 IU/L (ref 39–117)
BILIRUBIN TOTAL: 0.4 mg/dL (ref 0.0–1.2)
Bilirubin, Direct: 0.09 mg/dL (ref 0.00–0.40)
TOTAL PROTEIN: 7.2 g/dL (ref 6.0–8.5)

## 2018-09-19 ENCOUNTER — Telehealth: Payer: Self-pay | Admitting: *Deleted

## 2018-09-19 NOTE — Telephone Encounter (Signed)
Spoke with patient advised of results.  Voiced understanding 

## 2018-10-09 ENCOUNTER — Ambulatory Visit (INDEPENDENT_AMBULATORY_CARE_PROVIDER_SITE_OTHER): Payer: Medicare Other | Admitting: Physician Assistant

## 2018-10-09 DIAGNOSIS — R748 Abnormal levels of other serum enzymes: Secondary | ICD-10-CM

## 2018-10-10 ENCOUNTER — Encounter: Payer: Self-pay | Admitting: Physician Assistant

## 2018-10-10 LAB — HEPATIC FUNCTION PANEL
ALBUMIN: 4.4 g/dL (ref 3.6–4.8)
ALK PHOS: 76 IU/L (ref 39–117)
ALT: 24 IU/L (ref 0–32)
AST: 22 IU/L (ref 0–40)
BILIRUBIN TOTAL: 0.4 mg/dL (ref 0.0–1.2)
BILIRUBIN, DIRECT: 0.14 mg/dL (ref 0.00–0.40)
TOTAL PROTEIN: 7.1 g/dL (ref 6.0–8.5)

## 2018-10-10 NOTE — Progress Notes (Signed)
Lab letter has been sent via mail to patients home address 

## 2018-11-07 ENCOUNTER — Other Ambulatory Visit: Payer: Self-pay | Admitting: Physician Assistant

## 2018-11-07 ENCOUNTER — Other Ambulatory Visit: Payer: Self-pay

## 2018-11-07 ENCOUNTER — Ambulatory Visit (INDEPENDENT_AMBULATORY_CARE_PROVIDER_SITE_OTHER): Payer: Medicare Other | Admitting: Family Medicine

## 2018-11-07 ENCOUNTER — Encounter: Payer: Self-pay | Admitting: Family Medicine

## 2018-11-07 VITALS — BP 140/82 | HR 84 | Temp 97.8°F | Resp 14 | Ht 67.0 in | Wt 186.2 lb

## 2018-11-07 DIAGNOSIS — I10 Essential (primary) hypertension: Secondary | ICD-10-CM

## 2018-11-07 DIAGNOSIS — J029 Acute pharyngitis, unspecified: Secondary | ICD-10-CM

## 2018-11-07 DIAGNOSIS — M5137 Other intervertebral disc degeneration, lumbosacral region: Secondary | ICD-10-CM | POA: Diagnosis not present

## 2018-11-07 DIAGNOSIS — K219 Gastro-esophageal reflux disease without esophagitis: Secondary | ICD-10-CM

## 2018-11-07 DIAGNOSIS — R1013 Epigastric pain: Secondary | ICD-10-CM

## 2018-11-07 LAB — POCT RAPID STREP A (OFFICE): Rapid Strep A Screen: NEGATIVE

## 2018-11-07 MED ORDER — CYCLOBENZAPRINE HCL 10 MG PO TABS: 5.0000 mg | ORAL_TABLET | Freq: Three times a day (TID) | ORAL | 0 refills | Status: DC | PRN

## 2018-11-07 MED ORDER — OLMESARTAN MEDOXOMIL 20 MG PO TABS: 20.0000 mg | ORAL_TABLET | Freq: Every day | ORAL | 1 refills | Status: DC

## 2018-11-07 MED ORDER — OMEPRAZOLE 40 MG PO CPDR: 40.0000 mg | DELAYED_RELEASE_CAPSULE | Freq: Every day | ORAL | 0 refills | Status: DC

## 2018-11-07 NOTE — Telephone Encounter (Signed)
Requested Prescriptions  Pending Prescriptions Disp Refills  . olmesartan (BENICAR) 20 MG tablet [Pharmacy Med Name: OLMESARTAN MEDOXOMIL 20 MG TAB] 90 tablet 0    Sig: TAKE ONE TABLET BY MOUTH DAILY     Cardiovascular:  Angiotensin Receptor Blockers Passed - 11/07/2018 12:29 PM      Passed - Cr in normal range and within 180 days    Creat  Date Value Ref Range Status  07/13/2016 0.65 0.50 - 0.99 mg/dL Final    Comment:      For patients > or = 65 years of age: The upper reference limit for Creatinine is approximately 13% higher for people identified as African-American.      Creatinine, Ser  Date Value Ref Range Status  09/12/2018 0.84 0.57 - 1.00 mg/dL Final         Passed - K in normal range and within 180 days    Potassium  Date Value Ref Range Status  09/12/2018 5.0 3.5 - 5.2 mmol/L Final         Passed - Patient is not pregnant      Passed - Last BP in normal range    BP Readings from Last 1 Encounters:  09/12/18 103/68         Passed - Valid encounter within last 6 months    Recent Outpatient Visits          4 weeks ago Elevated liver enzymes   [facility], [place], PA-C   1 month ago Elevated liver enzymes   [facility], [place], PA-[place]   1 month ago Abdominal pain, epigastric   [facility], [place], PA-C   5 months ago Essential hypertension   [facility], Dema SeverinSarah L, PA-C   1 year ago Acute left-sided low back pain with left-sided sciatica   [facility], [place], [place]

## 2018-11-07 NOTE — Progress Notes (Signed)
12/18/20192:30 PM  Morgan Ruiz 1951/11/19, 67 y.o. female [id number]  Chief Complaint  Patient presents with  . Sinusitis    sore throat x 2 day    HPI:   Patient is a 67 y.o. female with past medical history significant for HTN who presents today for sore throat  Long standing issues with sinus However for past 2 days has been having sore throat Having problems with swallowing, eating, feels swollen Feeling achy No fever or ear pain No SOB or cough [place] to [place], requesting refills for med Takes flexeril as needed, s/p back surgery Omeprazole daily, GERD well controlled Had flu vaccine this season  Fall Risk  11/07/2018 09/12/2018 05/15/2018 10/23/2017 10/16/2017  Falls in the past year? 0 No No Yes No  Number falls in past yr: - - - 1 -  Injury with Fall? - - - Yes -  Comment - - - left knee- pt states a few mths ago -     Depression screen [facility] 2/9 11/07/2018 09/12/2018 05/15/2018  Decreased Interest 0 0 0  Down, Depressed, Hopeless 0 0 0  PHQ - 2 Score 0 0 0    Allergies  Allergen Reactions  . Vicodin [Hydrocodone-Acetaminophen] Rash    Prior to Admission medications   Medication Sig Start Date End Date Taking? Authorizing Provider  olmesartan (BENICAR) 20 MG tablet TAKE ONE TABLET BY MOUTH DAILY 11/07/18  Yes Benjiman Core D, PA-C  omeprazole (PRILOSEC) 40 MG capsule TAKE ONE CAPSULE BY MOUTH DAILY 11/07/18  Yes Myles Lipps, MD    Past Medical History:  Diagnosis Date  . Diabetes mellitus without complication (HCC)   . Hyperlipidemia   . Hypertension     No past surgical history on file.  Social History   Tobacco Use  . Smoking status: Former Smoker    Last attempt to quit: 11/21/2012    Years since quitting: 5.9  . Smokeless tobacco: Never Used  . Tobacco comment: social smoker  Substance Use Topics  . Alcohol use: No    Alcohol/week: 0.0 standard drinks    Family History  Problem Relation Age of Onset    . Diabetes type II Mother   . Heart attack Mother   . Liver cancer Father   . Heart attack Brother     ROS Per hpi  OBJECTIVE:  Blood pressure 140/75, pulse 84, temperature 97.8 F (36.6 C), temperature source Oral, resp. rate 14, height 5\' 7"  (1.702 m), weight 186 lb 3.2 oz (84.5 kg), SpO2 96 %. Body mass index is 29.16 kg/m.   Physical Exam Vitals signs and nursing note reviewed.  Constitutional:      Appearance: She is well-developed.  HENT:     Head: Normocephalic and atraumatic.     Right Ear: Hearing, ear canal and external ear normal. There is impacted cerumen.     Left Ear: Hearing, ear canal and external ear normal. There is impacted cerumen.     Mouth/Throat:     Mouth: Mucous membranes are moist.     Dentition: Has dentures.     Pharynx: Uvula midline. Pharyngeal swelling, posterior oropharyngeal erythema and uvula swelling present. No oropharyngeal exudate.     Tonsils: No tonsillar exudate. Swelling: 1+ on the right. 1+ on the left.  Eyes:     Conjunctiva/sclera: Conjunctivae normal.     Pupils: Pupils are equal, round, and reactive to light.  Neck:     Musculoskeletal: Neck supple.  Cardiovascular:  Rate and Rhythm: Normal rate and regular rhythm.     Heart sounds: Normal heart sounds. No murmur. No friction rub. No gallop.   Pulmonary:     Effort: Pulmonary effort is normal.     Breath sounds: Normal breath sounds. No wheezing or rales.  Lymphadenopathy:     Cervical: No cervical adenopathy.  Skin:    General: Skin is warm and dry.  Neurological:     Mental Status: She is alert and oriented to person, place, and time.     Results for orders placed or performed in visit on 11/07/18 (from the past 24 hour(s))  POCT rapid strep A     Status: None   Collection Time: 11/07/18  2:55 PM  Result Value Ref Range   Rapid Strep A Screen Negative Negative     ASSESSMENT and PLAN  1. Sore throat Neg strep. Most likely viral. Discussed supportive  measures for URI: increase hydration, rest, OTC medications, etc. RTC precautions discussed. - POCT rapid strep A - Culture, Group A Strep  2. Essential hypertension Controlled. Continue current regime.  - olmesartan (BENICAR) 20 MG tablet; Take 1 tablet (20 mg total) by mouth daily.  3. DDD (degenerative disc disease), lumbosacral Stable. Takes prn.  - cyclobenzaprine (FLEXERIL) 10 MG tablet; Take 0.5-1 tablets (5-10 mg total) by mouth 3 (three) times daily as needed.  4. Gastroesophageal reflux disease without esophagitis Controlled. Continue current regime.  - omeprazole (PRILOSEC) 40 MG capsule; Take 1 capsule (40 mg total) by mouth daily.  Return in about 6 months (around 05/09/2019).    Myles LippsIrma M Santiago, MD [facility] [address] [place], [place] [address] Ph.  [phone] Fax [phone]

## 2018-11-07 NOTE — Telephone Encounter (Signed)
Requested Prescriptions  Pending Prescriptions Disp Refills  . omeprazole (PRILOSEC) 40 MG capsule [Pharmacy Med Name: OMEPRAZOLE DR 40 MG CAPSULE] 90 capsule 0    Sig: TAKE ONE CAPSULE BY MOUTH DAILY     Gastroenterology: Proton Pump Inhibitors Passed - 11/07/2018  6:20 AM      Passed - Valid encounter within last 12 months    Recent Outpatient Visits          4 weeks ago Elevated liver enzymes   [facility], [place], PA-C   1 month ago Elevated liver enzymes   [facility], [place], PA-C   1 month ago Abdominal pain, epigastric   [facility], [place], PA-C   5 months ago Essential hypertension   [facility], Dema SeverinSarah L, PA-C   1 year ago Acute left-sided low back pain with left-sided sciatica   [facility], [place]

## 2018-11-07 NOTE — Patient Instructions (Addendum)
   If you have lab work done today you will be contacted with your lab results within the next 2 weeks.  If you have not heard from us then please contact us. The fastest way to get your results is to register for My Chart.   IF you received an x-ray today, you will receive an invoice from [facility]. Please contact  Radiology at [phone] with questions or concerns regarding your invoice.   IF you received labwork today, you will receive an invoice from [facility]. Please contact [facility] at [phone] with questions or concerns regarding your invoice.   Our billing staff will not be able to assist you with questions regarding bills from these companies.  You will be contacted with the lab results as soon as they are available. The fastest way to get your results is to activate your My Chart account. Instructions are located on the last page of this paperwork. If you have not heard from us regarding the results in 2 weeks, please contact this office.     Sore Throat When you have a sore throat, your throat may feel:  Tender.  Burning.  Irritated.  Scratchy.  Painful when you swallow.  Painful when you talk. Many things can cause a sore throat, such as:  An infection.  Allergies.  Dry air.  Smoke or pollution.  Radiation treatment.  Gastroesophageal reflux disease (GERD).  A tumor. A sore throat can be the first sign of another sickness. It can happen with other problems, like:  Coughing.  Sneezing.  Fever.  Swelling in the neck. Most sore throats go away without treatment. Follow these instructions at home:      Take over-the-counter medicines only as told by your doctor. ? If your child has a sore throat, do not give your child aspirin.  Drink enough fluids to keep your pee (urine) pale yellow.  Rest when you feel you need to.  To help with pain: ? Sip warm liquids, such as broth, herbal tea, or warm water. ? Eat or drink  cold or frozen liquids, such as frozen ice pops. ? Gargle with a salt-water mixture 3-4 times a day or as needed. To make a salt-water mixture, add -1 tsp (3-6 g) of salt to 1 cup (237 mL) of warm water. Mix it until you cannot see the salt anymore. ? Suck on hard candy or throat lozenges. ? Put a cool-mist humidifier in your bedroom at night. ? Sit in the bathroom with the door closed for 5-10 minutes while you run hot water in the shower.  Do not use any products that contain nicotine or tobacco, such as cigarettes, e-cigarettes, and chewing tobacco. If you need help quitting, ask your doctor.  Wash your hands well and often with soap and water. If soap and water are not available, use hand sanitizer. Contact a doctor if:  You have a fever for more than 2-3 days.  You keep having symptoms for more than 2-3 days.  Your throat does not get better in 7 days.  You have a fever and your symptoms suddenly get worse.  Your child who is 3 months to 3 years old has a temperature of 102.2F (39C) or higher. Get help right away if:  You have trouble breathing.  You cannot swallow fluids, soft foods, or your saliva.  You have swelling in your throat or neck that gets worse.  You keep feeling sick to your stomach (nauseous).  You keep throwing up (  vomiting). Summary  A sore throat is pain, burning, irritation, or scratchiness in the throat. Many things can cause a sore throat.  Take over-the-counter medicines only as told by your doctor. Do not give your child aspirin.  Drink plenty of fluids, and rest as needed.  Contact a doctor if your symptoms get worse or your sore throat does not get better within 7 days. This information is not intended to replace advice given to you by your health care provider. Make sure you discuss any questions you have with your health care provider. Document Released: 08/16/2008 Document Revised: 04/09/2018 Document Reviewed: 04/09/2018 Elsevier  Interactive Patient Education  2019 Elsevier Inc.  

## 2018-11-10 LAB — CULTURE, GROUP A STREP: Strep A Culture: NEGATIVE

## 2019-01-25 ENCOUNTER — Encounter: Payer: Self-pay | Admitting: Emergency Medicine

## 2019-01-25 ENCOUNTER — Ambulatory Visit (INDEPENDENT_AMBULATORY_CARE_PROVIDER_SITE_OTHER): Payer: Medicare Other | Admitting: Emergency Medicine

## 2019-01-25 VITALS — BP 129/83 | HR 85 | Temp 98.1°F | Resp 16 | Ht 67.0 in | Wt 187.0 lb

## 2019-01-25 DIAGNOSIS — R1013 Epigastric pain: Secondary | ICD-10-CM

## 2019-01-25 DIAGNOSIS — Z8711 Personal history of peptic ulcer disease: Secondary | ICD-10-CM

## 2019-01-25 MED ORDER — OMEPRAZOLE 40 MG PO CPDR: 40.0000 mg | DELAYED_RELEASE_CAPSULE | Freq: Every day | ORAL | 3 refills | Status: DC

## 2019-01-25 NOTE — Patient Instructions (Addendum)
     If you have lab work done today you will be contacted with your lab results within the next 2 weeks.  If you have not heard from us then please contact us. The fastest way to get your results is to register for My Chart.   IF you received an x-ray today, you will receive an invoice from [facility]. Please contact [facility] at [phone] with questions or concerns regarding your invoice.   IF you received labwork today, you will receive an invoice from [facility]. Please contact [facility] at [phone] with questions or concerns regarding your invoice.   Our billing staff will not be able to assist you with questions regarding bills from these companies.  You will be contacted with the lab results as soon as they are available. The fastest way to get your results is to activate your My Chart account. Instructions are located on the last page of this paperwork. If you have not heard from us regarding the results in 2 weeks, please contact this office.      Abdominal Pain, Adult  Many things can cause belly (abdominal) pain. Most times, belly pain is not dangerous. Many cases of belly pain can be watched and treated at home. Sometimes belly pain is serious, though. Your doctor will try to find the cause of your belly pain. Follow these instructions at home:  Take over-the-counter and prescription medicines only as told by your doctor. Do not take medicines that help you poop (laxatives) unless told to by your doctor.  Drink enough fluid to keep your pee (urine) clear or pale yellow.  Watch your belly pain for any changes.  Keep all follow-up visits as told by your doctor. This is important. Contact a doctor if:  Your belly pain changes or gets worse.  You are not hungry, or you lose weight without trying.  You are having trouble pooping (constipated) or have watery poop (diarrhea) for more than 2-3 days.  You have pain when you pee or poop.  Your belly  pain wakes you up at night.  Your pain gets worse with meals, after eating, or with certain foods.  You are throwing up and cannot keep anything down.  You have a fever. Get help right away if:  Your pain does not go away as soon as your doctor says it should.  You cannot stop throwing up.  Your pain is only in areas of your belly, such as the right side or the left lower part of the belly.  You have bloody or black poop, or poop that looks like tar.  You have very bad pain, cramping, or bloating in your belly.  You have signs of not having enough fluid or water in your body (dehydration), such as: ? Dark pee, very little pee, or no pee. ? Cracked lips. ? Dry mouth. ? Sunken eyes. ? Sleepiness. ? Weakness. This information is not intended to replace advice given to you by your health care provider. Make sure you discuss any questions you have with your health care provider. Document Released: 04/25/2008 Document Revised: 05/27/2016 Document Reviewed: 04/20/2016 Elsevier Interactive Patient Education  2019 Elsevier Inc.  

## 2019-01-25 NOTE — Progress Notes (Signed)
Morgan Ruiz 68 y.o.   Chief Complaint  Patient presents with  . Abdominal Pain    diarrhea yesterday, pt has hx of ulcers. pt would like to try melxicam for pain.    HISTORY OF PRESENT ILLNESS: This is a 68 y.o. female with history of peptic ulcer disease, treated in the past, complaining of epigastric intermittent discomfort with 2 bouts of diarrhea yesterday.  Denies any other significant symptoms.  Able to eat and drink.  Denies nausea or vomiting.  Denies fever or chills.  Denies melena or rectal bleeding. Past medical history includes back surgery in 2018, takes Tylenol occasionally for pain. Family history significant for liver cancer. No other complaints or medical concerns today. Had blood work done last October which showed transient elevation of liver enzymes that returned to normal 1 month later.  HPI   Prior to Admission medications   Medication Sig Start Date End Date Taking? Authorizing Provider  cyclobenzaprine (FLEXERIL) 10 MG tablet Take 0.5-1 tablets (5-10 mg total) by mouth 3 (three) times daily as needed. 11/07/18  Yes Myles Lipps, MD  olmesartan (BENICAR) 20 MG tablet Take 1 tablet (20 mg total) by mouth daily. 11/07/18  Yes Myles Lipps, MD  omeprazole (PRILOSEC) 40 MG capsule Take 1 capsule (40 mg total) by mouth daily. 11/07/18  Yes Myles Lipps, MD    Allergies  Allergen Reactions  . Vicodin [Hydrocodone-Acetaminophen] Rash    Patient Active Problem List   Diagnosis Date Noted  . Diabetes mellitus without complication (HCC) 01/24/2016  . Hypertension 01/24/2016  . Hyperlipidemia 01/24/2016    Past Medical History:  Diagnosis Date  . Diabetes mellitus without complication (HCC)   . Hyperlipidemia   . Hypertension     No past surgical history on file.  Social History   Socioeconomic History  . Marital status: Married    Spouse name: Not on file  . Number of children: Not on file  . Years of education: Not on file  .  Highest education level: Not on file  Occupational History  . Occupation: Nature conservation officer  Social Needs  . Financial resource strain: Not on file  . Food insecurity:    Worry: Not on file    Inability: Not on file  . Transportation needs:    Medical: Not on file    Non-medical: Not on file  Tobacco Use  . Smoking status: Former Smoker    Last attempt to quit: 11/21/2012    Years since quitting: 6.1  . Smokeless tobacco: Never Used  . Tobacco comment: social smoker  Substance and Sexual Activity  . Alcohol use: No    Alcohol/week: 0.0 standard drinks  . Drug use: No  . Sexual activity: Yes  Lifestyle  . Physical activity:    Days per week: Not on file    Minutes per session: Not on file  . Stress: Not on file  Relationships  . Social connections:    Talks on phone: Not on file    Gets together: Not on file    Attends religious service: Not on file    Active member of club or organization: Not on file    Attends meetings of clubs or organizations: Not on file    Relationship status: Not on file  . Intimate partner violence:    Fear of current or ex partner: Not on file    Emotionally abused: Not on file    Physically abused: Not on file    Forced  sexual activity: Not on file  Other Topics Concern  . Not on file  Social History Narrative   From [place] - to [place] in 1999   Married    daughter and 2 grandchild    Family History  Problem Relation Age of Onset  . Diabetes type II Mother   . Heart attack Mother   . Liver cancer Father   . Heart attack Brother      Review of Systems  Constitutional: Negative.  Negative for chills and fever.  HENT: Negative.   Eyes: Negative.   Respiratory: Negative.  Negative for cough and shortness of breath.   Cardiovascular: Negative.  Negative for chest pain and palpitations.  Gastrointestinal: Positive for abdominal pain, diarrhea and heartburn. Negative for blood in stool, melena, nausea and vomiting.  Genitourinary:  Negative.   Musculoskeletal: Positive for back pain.  Skin: Negative for rash.  Neurological: Negative.  Negative for dizziness and headaches.  Endo/Heme/Allergies: Negative.   All other systems reviewed and are negative.   Vitals:   01/25/19 1404  BP: 129/83  Pulse: 85  Resp: 16  Temp: 98.1 F (36.7 C)  SpO2: 96%    Physical Exam Vitals signs reviewed.  Constitutional:      Appearance: She is well-developed.  HENT:     Head: Normocephalic and atraumatic.     Mouth/Throat:     Mouth: Mucous membranes are moist.     Pharynx: Oropharynx is clear.  Eyes:     Extraocular Movements: Extraocular movements intact.     Conjunctiva/sclera: Conjunctivae normal.     Pupils: Pupils are equal, round, and reactive to light.  Neck:     Musculoskeletal: Normal range of motion and neck supple.  Cardiovascular:     Rate and Rhythm: Normal rate and regular rhythm.     Heart sounds: Normal heart sounds.  Pulmonary:     Effort: Pulmonary effort is normal.     Breath sounds: Normal breath sounds.  Abdominal:     General: Bowel sounds are normal. There is no distension.     Palpations: Abdomen is soft. There is no mass.     Tenderness: There is no abdominal tenderness. There is no guarding.     Hernia: No hernia is present.  Musculoskeletal: Normal range of motion.  Skin:    General: Skin is warm and dry.     Capillary Refill: Capillary refill takes less than 2 seconds.  Neurological:     General: No focal deficit present.     Mental Status: She is alert and oriented to person, place, and time.  Psychiatric:        Mood and Affect: Mood normal.        Behavior: Behavior normal.    A total of 25 minutes was spent in the room with the patient, greater than 50% of which was in counseling/coordination of care regarding differential diagnosis, treatment, medications, review of blood work, prognosis, and need for follow-up.   ASSESSMENT & PLAN: Morgan Ruiz was seen today for abdominal  pain.  Diagnoses and all orders for this visit:  Epigastric discomfort -     CBC with Differential/Platelet -     Comprehensive metabolic panel -     Lipase -     omeprazole (PRILOSEC) 40 MG capsule; Take 1 capsule (40 mg total) by mouth daily.  History of peptic ulcer disease -     omeprazole (PRILOSEC) 40 MG capsule; Take 1 capsule (40 mg total) by mouth daily.  Patient Instructions       If you have lab work done today you will be contacted with your lab results within the next 2 weeks.  If you have not heard from [place] then please contact us. The fastest way to get your results is to register for My Chart.   IF you received an x-ray today, you will receive an invoice from [facility]. Please contact [facility] at [phone] with questions or concerns regarding your invoice.   IF you received labwork today, you will receive an invoice from [facility]. Please contact [facility] at [phone] with questions or concerns regarding your invoice.   Our billing staff will not be able to assist you with questions regarding bills from these companies.  You will be contacted with the lab results as soon as they are available. The fastest way to get your results is to activate your My Chart account. Instructions are located on the last page of this paperwork. If you have not heard from [place] regarding the results in 2 weeks, please contact this office.      Abdominal Pain, Adult  Many things can cause belly (abdominal) pain. Most times, belly pain is not dangerous. Many cases of belly pain can be watched and treated at home. Sometimes belly pain is serious, though. Your doctor will try to find the cause of your belly pain. Follow these instructions at home:  Take over-the-counter and prescription medicines only as told by your doctor. Do not take medicines that help you poop (laxatives) unless told to by your doctor.  Drink enough fluid to keep your pee (urine) clear  or pale yellow.  Watch your belly pain for any changes.  Keep all follow-up visits as told by your doctor. This is important. Contact a doctor if:  Your belly pain changes or gets worse.  You are not hungry, or you lose weight without trying.  You are having trouble pooping (constipated) or have watery poop (diarrhea) for more than 2-3 days.  You have pain when you pee or poop.  Your belly pain wakes you up at night.  Your pain gets worse with meals, after eating, or with certain foods.  You are throwing up and cannot keep anything down.  You have a fever. Get help right away if:  Your pain does not go away as soon as your doctor says it should.  You cannot stop throwing up.  Your pain is only in areas of your belly, such as the right side or the left lower part of the belly.  You have bloody or black poop, or poop that looks like tar.  You have very bad pain, cramping, or bloating in your belly.  You have signs of not having enough fluid or water in your body (dehydration), such as: ? Dark pee, very little pee, or no pee. ? Cracked lips. ? Dry mouth. ? Sunken eyes. ? Sleepiness. ? Weakness. This information is not intended to replace advice given to you by your health care provider. Make sure you discuss any questions you have with your health care provider. Document Released: 04/25/2008 Document Revised: 05/27/2016 Document Reviewed: 04/20/2016 Elsevier Interactive Patient Education  2019 Elsevier Inc.      Edwina Barth, MD [facility]

## 2019-01-26 LAB — COMPREHENSIVE METABOLIC PANEL
A/G RATIO: 1.7 (ref 1.2–2.2)
ALBUMIN: 4.5 g/dL (ref 3.8–4.8)
ALK PHOS: 82 IU/L (ref 39–117)
ALT: 63 IU/L — AB (ref 0–32)
AST: 33 IU/L (ref 0–40)
BILIRUBIN TOTAL: 0.3 mg/dL (ref 0.0–1.2)
BUN / CREAT RATIO: 21 (ref 12–28)
BUN: 15 mg/dL (ref 8–27)
CO2: 22 mmol/L (ref 20–29)
Calcium: 9.9 mg/dL (ref 8.7–10.3)
Chloride: 103 mmol/L (ref 96–106)
Creatinine, Ser: 0.72 mg/dL (ref 0.57–1.00)
GFR, EST AFRICAN AMERICAN: 102 mL/min/{1.73_m2} (ref >59)
GFR, EST NON AFRICAN AMERICAN: 88 mL/min/{1.73_m2} (ref >59)
GLOBULIN, TOTAL: 2.7 g/dL (ref 1.5–4.5)
GLUCOSE: 114 mg/dL — AB (ref 65–99)
Potassium: 5.5 mmol/L — ABNORMAL HIGH (ref 3.5–5.2)
Sodium: 141 mmol/L (ref 134–144)
TOTAL PROTEIN: 7.2 g/dL (ref 6.0–8.5)

## 2019-01-26 LAB — CBC WITH DIFFERENTIAL/PLATELET
BASOS: 1 %
Basophils Absolute: 0.1 10*3/uL (ref 0.0–0.2)
EOS (ABSOLUTE): 0.2 10*3/uL (ref 0.0–0.4)
EOS: 3 %
HEMATOCRIT: 40.2 % (ref 34.0–46.6)
HEMOGLOBIN: 14 g/dL (ref 11.1–15.9)
IMMATURE GRANS (ABS): 0 10*3/uL (ref 0.0–0.1)
Immature Granulocytes: 0 %
LYMPHS ABS: 2.3 10*3/uL (ref 0.7–3.1)
LYMPHS: 34 %
MCH: 29.8 pg (ref 26.6–33.0)
MCHC: 34.8 g/dL (ref 31.5–35.7)
MCV: 86 fL (ref 79–97)
MONOCYTES: 7 %
Monocytes Absolute: 0.5 10*3/uL (ref 0.1–0.9)
NEUTROS ABS: 3.6 10*3/uL (ref 1.4–7.0)
Neutrophils: 55 %
Platelets: 379 10*3/uL (ref 150–450)
RBC: 4.7 x10E6/uL (ref 3.77–5.28)
RDW: 12.4 % (ref 11.7–15.4)
WBC: 6.7 10*3/uL (ref 3.4–10.8)

## 2019-01-26 LAB — LIPASE: Lipase: 36 U/L (ref 14–72)

## 2019-07-03 DIAGNOSIS — Z0100 Encounter for examination of eyes and vision without abnormal findings: Secondary | ICD-10-CM | POA: Diagnosis not present

## 2019-07-25 ENCOUNTER — Telehealth: Payer: Self-pay | Admitting: *Deleted

## 2019-07-25 NOTE — Telephone Encounter (Signed)
Faxed Rx request for omeprazole 40 mg with no additional refills. Confirmation page received at 12:13 pm.

## 2019-08-07 ENCOUNTER — Emergency Department (HOSPITAL_COMMUNITY): Payer: Medicare Other

## 2019-08-07 ENCOUNTER — Emergency Department (HOSPITAL_COMMUNITY)
Admission: EM | Admit: 2019-08-07 | Discharge: 2019-08-07 | Disposition: A | Discharge status: Home / Self Care | Payer: Medicare Other | Attending: Emergency Medicine | Admitting: Emergency Medicine

## 2019-08-07 ENCOUNTER — Encounter (HOSPITAL_COMMUNITY): Payer: Self-pay | Admitting: *Deleted

## 2019-08-07 DIAGNOSIS — E119 Type 2 diabetes mellitus without complications: Secondary | ICD-10-CM | POA: Diagnosis not present

## 2019-08-07 DIAGNOSIS — R42 Dizziness and giddiness: Secondary | ICD-10-CM

## 2019-08-07 DIAGNOSIS — R079 Chest pain, unspecified: Secondary | ICD-10-CM | POA: Diagnosis not present

## 2019-08-07 DIAGNOSIS — I10 Essential (primary) hypertension: Secondary | ICD-10-CM | POA: Diagnosis not present

## 2019-08-07 DIAGNOSIS — Z87891 Personal history of nicotine dependence: Secondary | ICD-10-CM | POA: Insufficient documentation

## 2019-08-07 DIAGNOSIS — Z79899 Other long term (current) drug therapy: Secondary | ICD-10-CM | POA: Insufficient documentation

## 2019-08-07 DIAGNOSIS — M79602 Pain in left arm: Secondary | ICD-10-CM | POA: Diagnosis not present

## 2019-08-07 LAB — CBC
HCT: 42 % (ref 36.0–46.0)
Hemoglobin: 13.8 g/dL (ref 12.0–15.0)
MCH: 29.7 pg (ref 26.0–34.0)
MCHC: 32.9 g/dL (ref 30.0–36.0)
MCV: 90.3 fL (ref 80.0–100.0)
Platelets: 342 10*3/uL (ref 150–400)
RBC: 4.65 MIL/uL (ref 3.87–5.11)
RDW: 12.1 % (ref 11.5–15.5)
WBC: 7.6 10*3/uL (ref 4.0–10.5)
nRBC: 0 % (ref 0.0–0.2)

## 2019-08-07 LAB — BASIC METABOLIC PANEL
Anion gap: 10 (ref 5–15)
BUN: 15 mg/dL (ref 8–23)
CO2: 22 mmol/L (ref 22–32)
Calcium: 9.8 mg/dL (ref 8.9–10.3)
Chloride: 106 mmol/L (ref 98–111)
Creatinine, Ser: 0.75 mg/dL (ref 0.44–1.00)
GFR calc Af Amer: >60 mL/min (ref >60)
GFR calc non Af Amer: >60 mL/min (ref >60)
Glucose, Bld: 196 mg/dL — ABNORMAL HIGH (ref 70–99)
Potassium: 4.4 mmol/L (ref 3.5–5.1)
Sodium: 138 mmol/L (ref 135–145)

## 2019-08-07 LAB — TROPONIN I (HIGH SENSITIVITY)
Troponin I (High Sensitivity): 4 ng/L (ref <18)
Troponin I (High Sensitivity): 4 ng/L (ref <18)

## 2019-08-07 IMAGING — DX DG CHEST 2V
2 series · 2 of 2 positions shown · non-contrast
Comparison: [DATE]

CLINICAL DATA: Chest pain

EXAM:
CHEST - 2 VIEW

[chest pa]
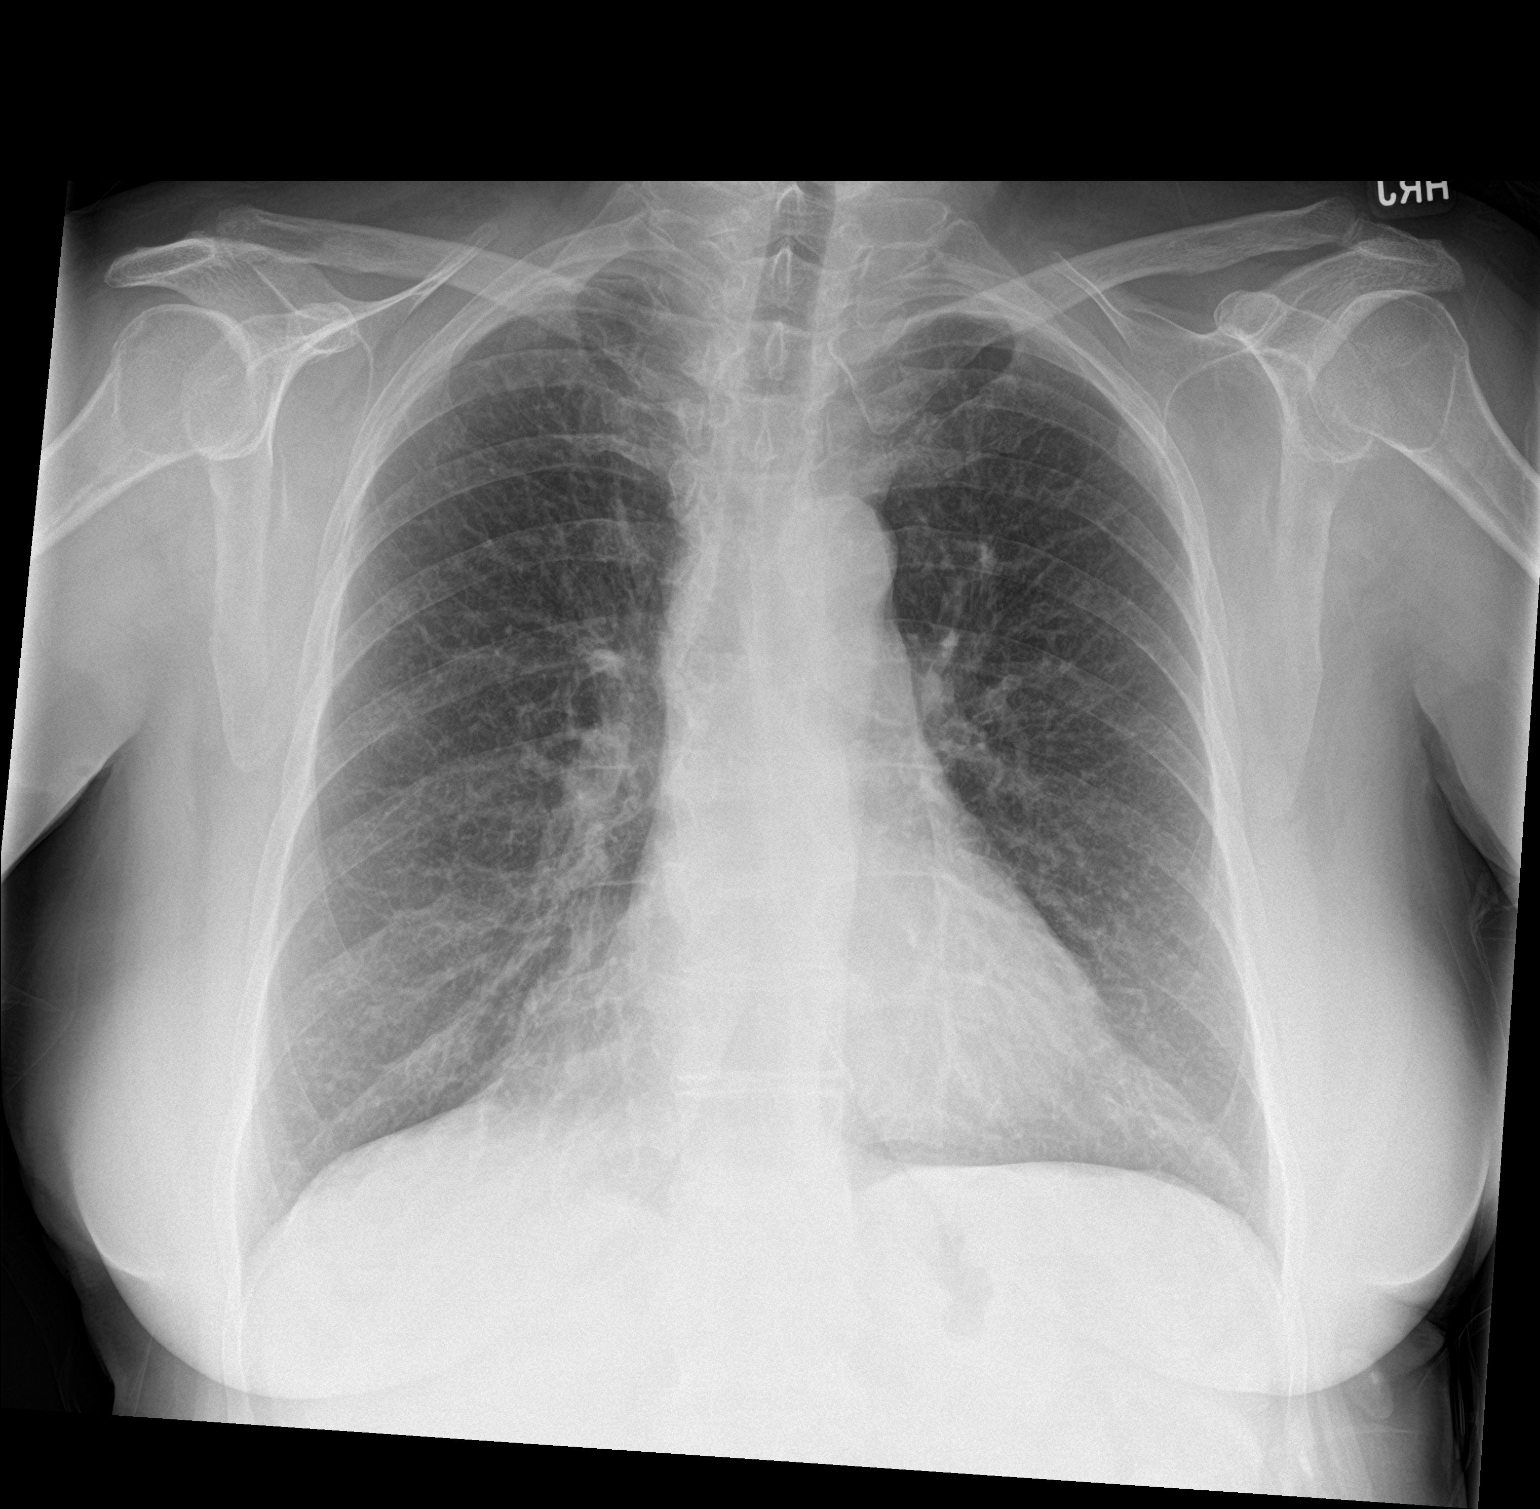

[chest lat]
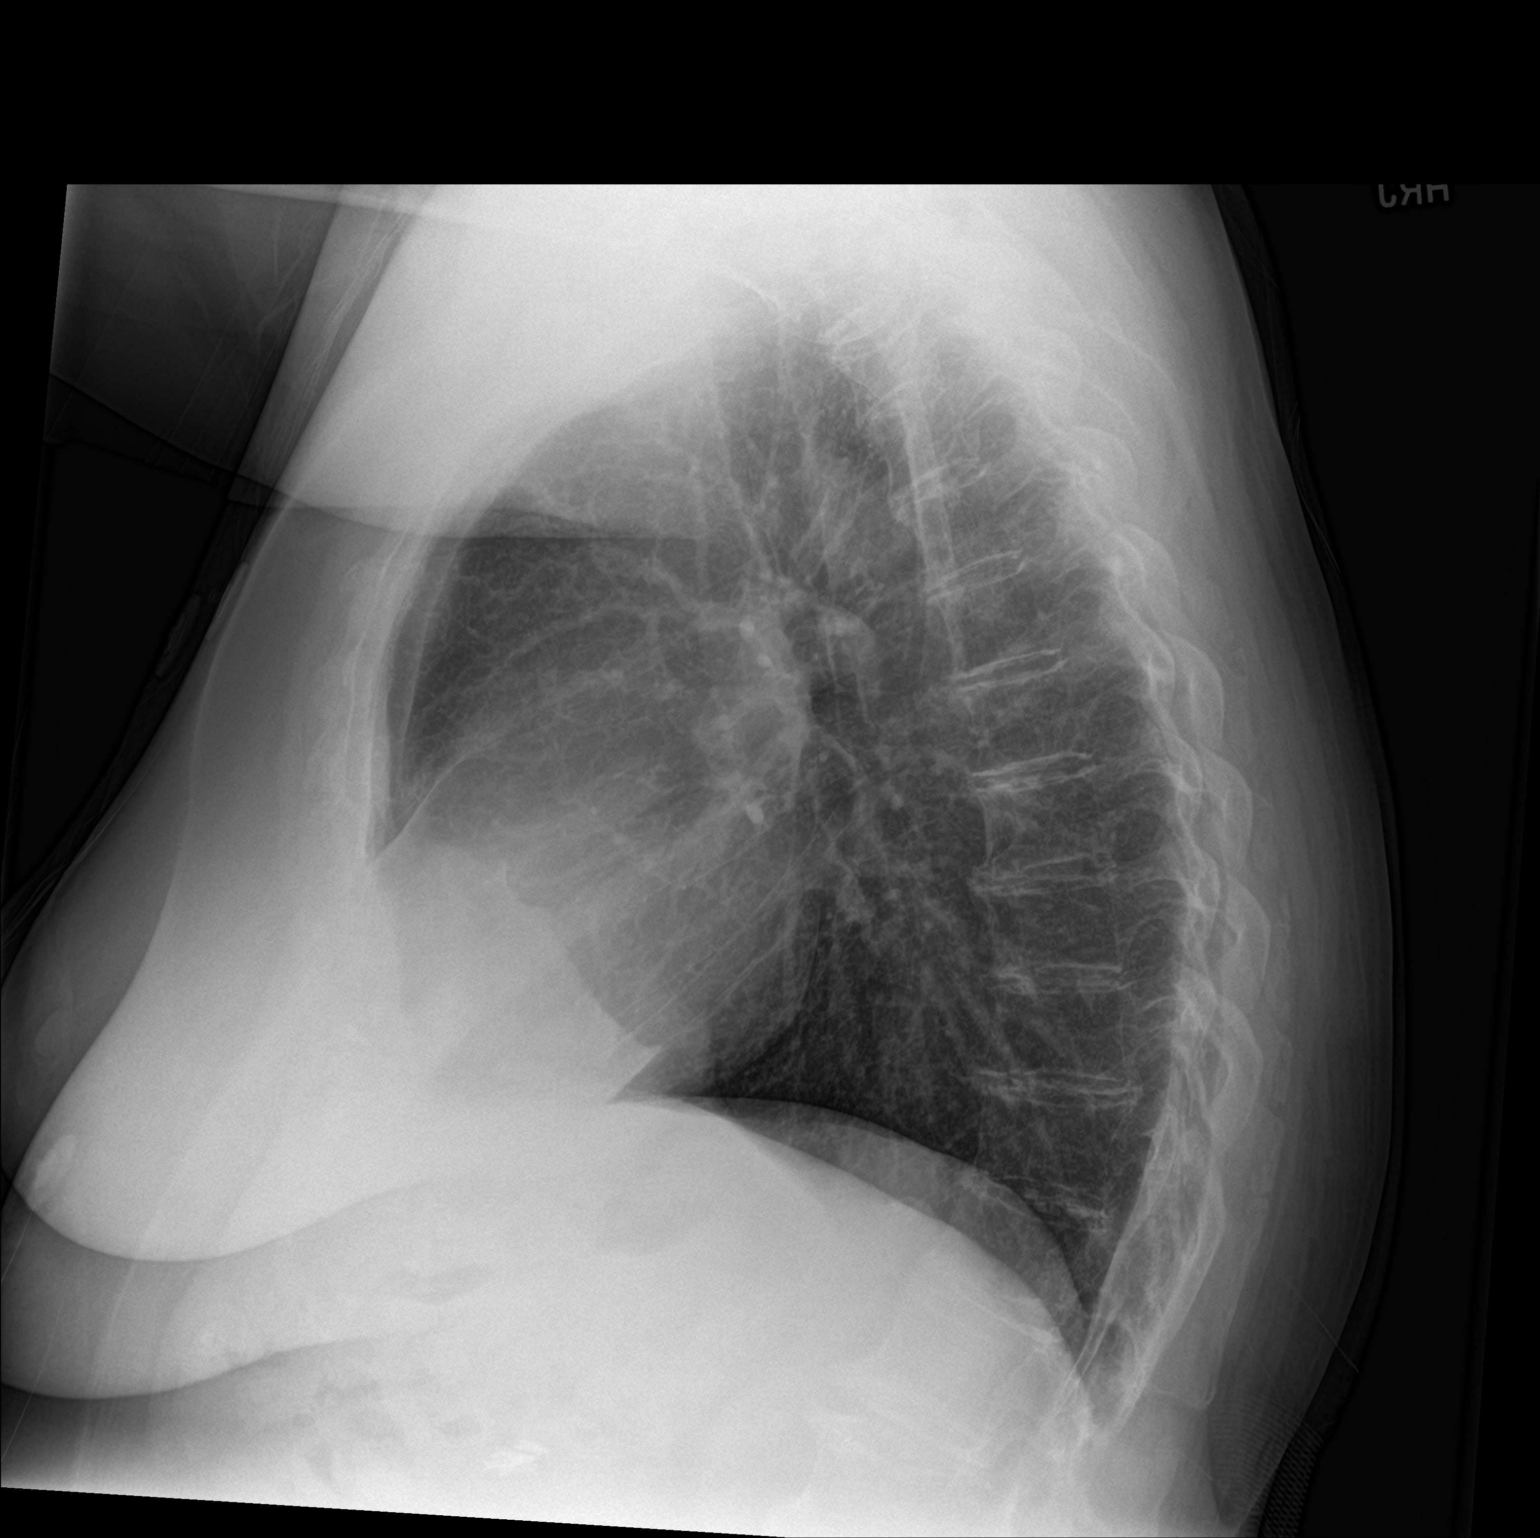

[2 of 2 positions shown; findings below may reference images not displayed]

FINDINGS: Chronic interstitial coarsening with large lung volumes. There is no
edema, consolidation, effusion, or pneumothorax. Normal heart size
and mediastinal contours.
IMPRESSION: Stable exam.  No acute finding.

## 2019-08-07 IMAGING — CT CT HEAD W/O CM
3 series · 15 of 47 positions shown, 18 images · non-contrast
Comparison: None.

CLINICAL DATA: Episodic vertigo. Neck pain and left arm pain.

EXAM:
CT HEAD WITHOUT CONTRAST
TECHNIQUE: Contiguous axial images were obtained from the base of the skull
through the vertex without intravenous contrast.

[Series 3: head 5.0 h30s · axial · 0.40mm/px · z∈[-152,-27]mm · 9 of 31 slices shown, 12 images]
[im 3/31  brain]
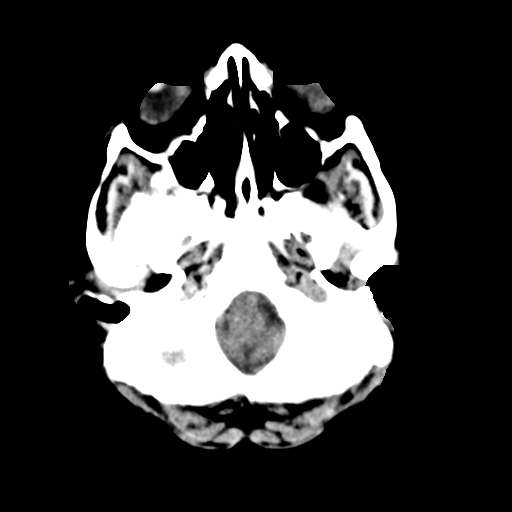
[im 3/31  bone]
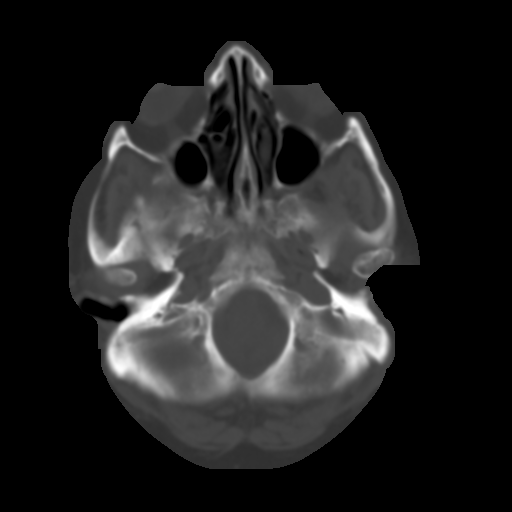
[im 6/31  brain]
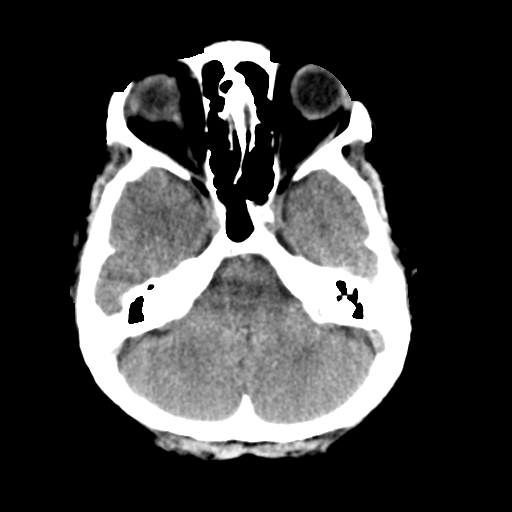
[im 9/31  brain]
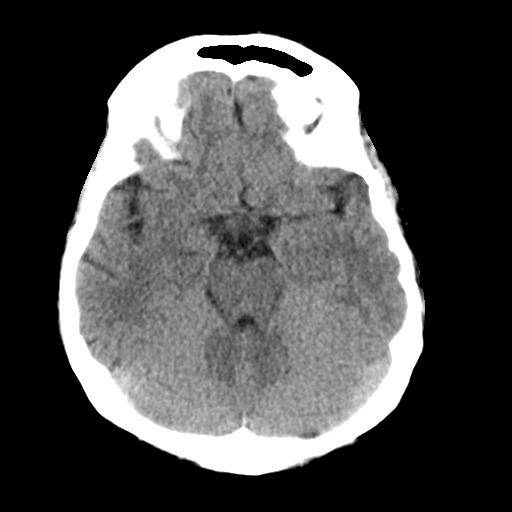
[im 12/31  brain]
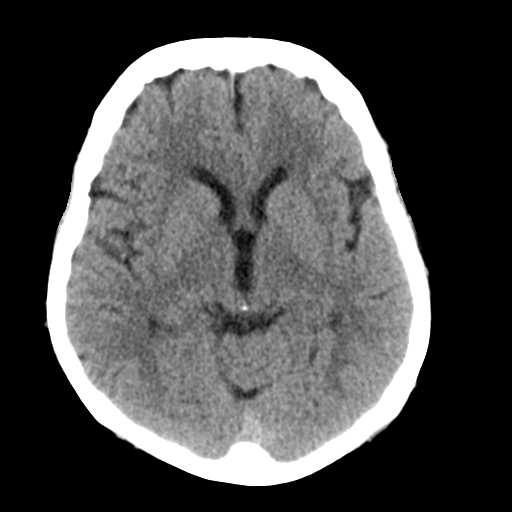
[im 16/31  brain]
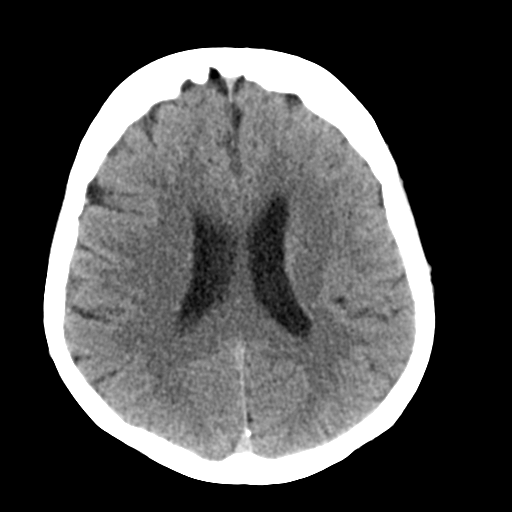
[im 16/31  bone]
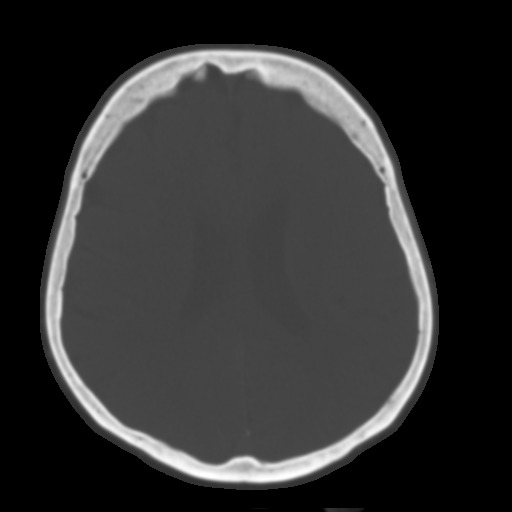
[im 19/31  brain]
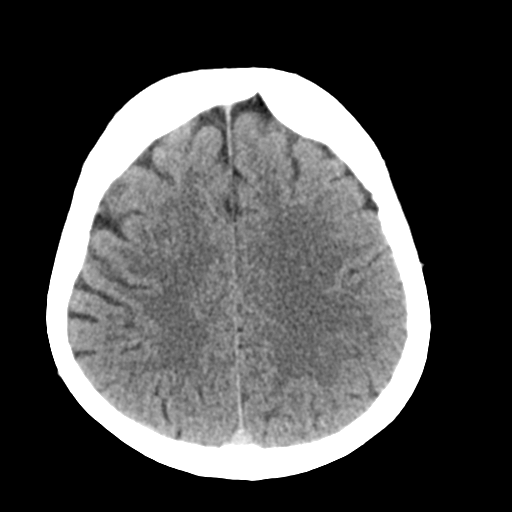
[im 22/31  brain]
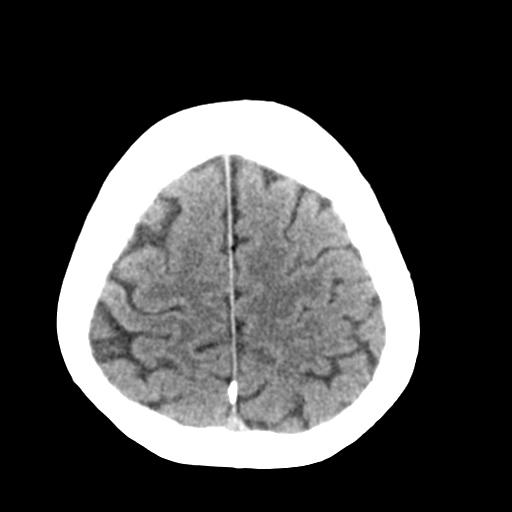
[im 25/31  brain]
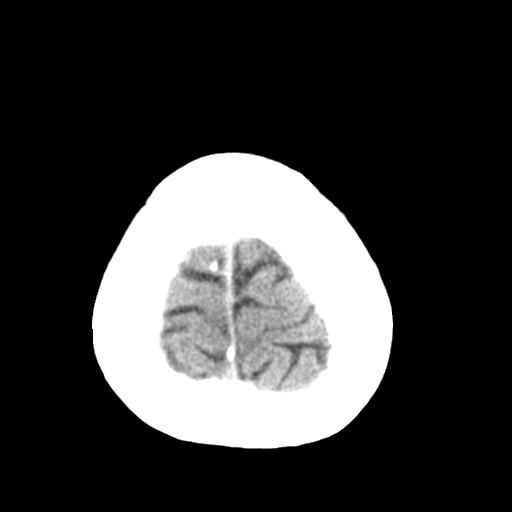
[im 28/31  brain]
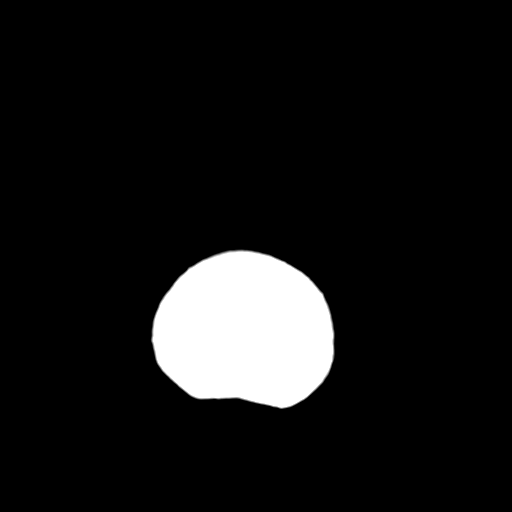
[im 28/31  bone]
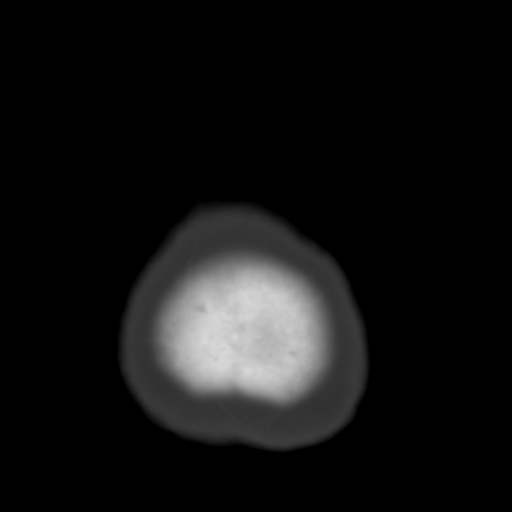

[Series 5: head 3.0 mpr cor · coronal · 0.30mm/px · 3 of 67 slices shown]
[im 23/67  brain]
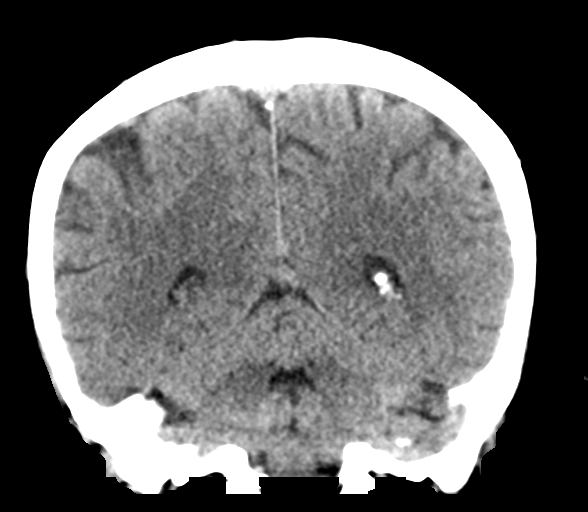
[im 30/67  brain]
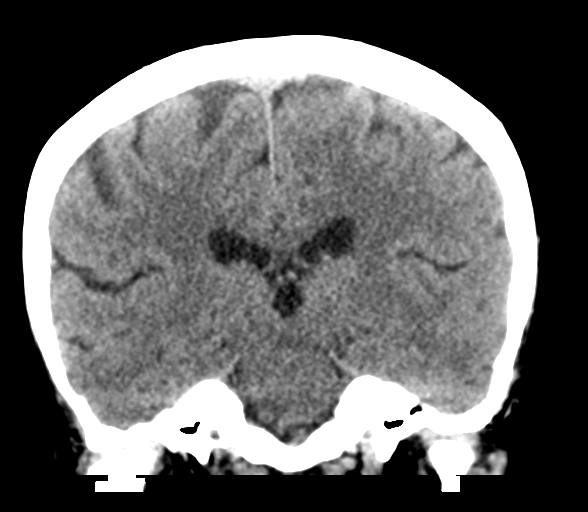
[im 37/67  brain]
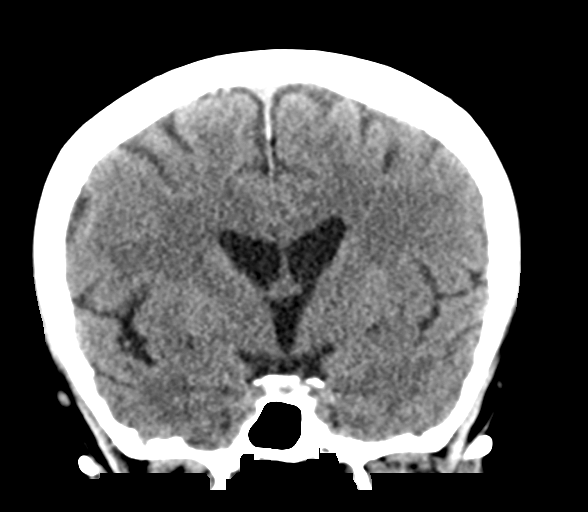

[Series 6: head 3.0 mpr sag · sagittal · 0.30mm/px · 3 of 62 slices shown]
[im 21/62  brain]
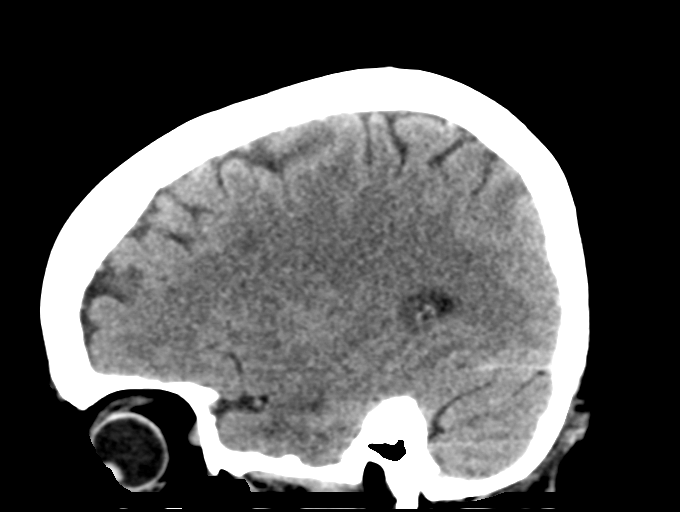
[im 31/62  brain]
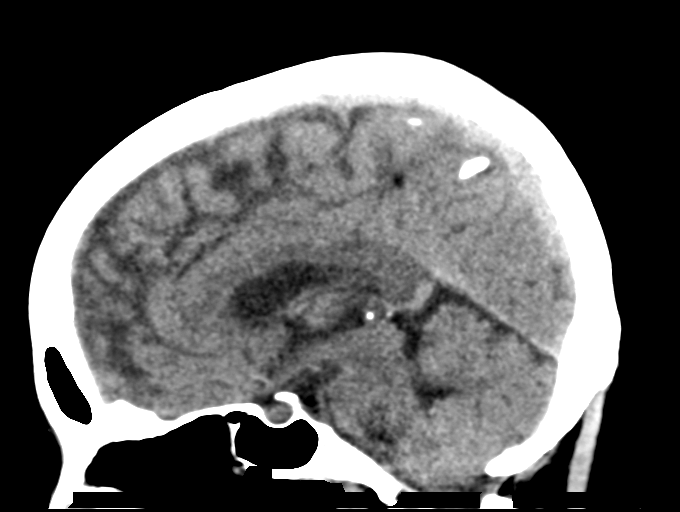
[im 41/62  brain]
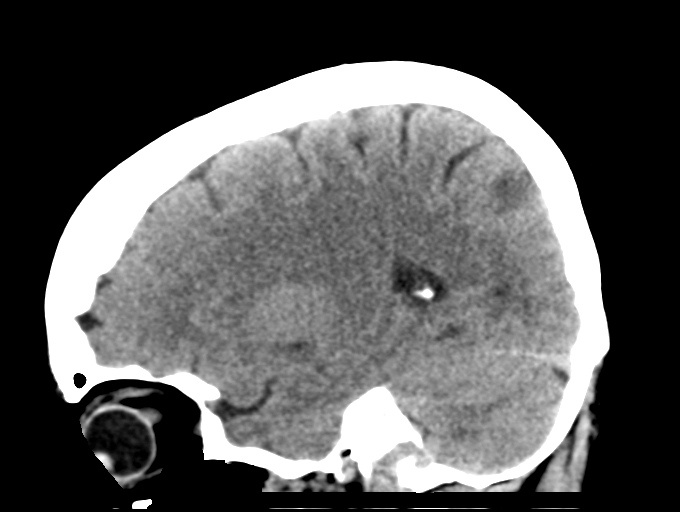

[15 of 47 positions shown; findings below may reference images not displayed]

FINDINGS: Brain: No evidence of acute infarction, hemorrhage, hydrocephalus,
extra-axial collection or mass lesion/mass effect.

Vascular: No hyperdense vessel or unexpected calcification.

Skull: Normal. Negative for fracture or focal lesion.

Sinuses/Orbits: Normal.

Other: Prominent soft tissue appears to almost occluded the left
external auditory canal, probably representing cerumen.
IMPRESSION: Normal appearing brain. Prominent cerumen in the left external
auditory canal.

## 2019-08-07 MED ORDER — SODIUM CHLORIDE 0.9% FLUSH: 3.0000 mL | Freq: Once | INTRAVENOUS | Status: DC

## 2019-08-07 NOTE — ED Notes (Signed)
Paramedic and EMT attempted to get blood. Will ask phlebotomy.

## 2019-08-07 NOTE — ED Notes (Signed)
ED Provider at bedside. 

## 2019-08-07 NOTE — ED Triage Notes (Signed)
To ED for eval of cp and neck pain that started yesterday after work. States her bp was 224 systolic- has been consistently. States she has had left arm pain for the past few months. She didn't think much of it until she became dizzy and pain worse yesterday. Now states she only has neck pain and left arm pain. Appears in nad.

## 2019-08-07 NOTE — ED Provider Notes (Signed)
[facility] EMERGENCY DEPARTMENT Provider Note   CSN: [id number] Arrival date & time: 08/07/19  0849     History   Chief Complaint Chief Complaint  Patient presents with  . Chest Pain    HPI Morgan Ruiz is a 68 y.o. female.  She has a history of diabetes hypertension hypercholesterolemia.  She has had pain in her left arm more consistently for the last week or so although she said it bothered her on and off for years.  No pain in her chest.  She also had an episode of dizziness and unsteadiness yesterday.  That is resolved but she said it is actually been going on for the last 20 years on and off.  She also noticed that her blood pressure was high.  She seen neurology for some of these complaints in the past and was told she has neuropathy.     The history is provided by the patient.  Dizziness Quality:  Lightheadedness and imbalance Severity:  Moderate Onset quality:  Sudden Timing:  Intermittent Progression:  Unchanged Chronicity:  Recurrent Context: not with loss of consciousness   Relieved by:  None tried Worsened by:  Nothing Ineffective treatments:  None tried Associated symptoms: no chest pain, no diarrhea, no headaches, no nausea, no palpitations, no shortness of breath, no vision changes and no vomiting     Past Medical History:  Diagnosis Date  . Diabetes mellitus without complication ([place])   . Hyperlipidemia   . Hypertension     Patient Active Problem List   Diagnosis Date Noted  . Epigastric discomfort 01/25/2019  . History of peptic ulcer disease 01/25/2019  . Diabetes mellitus without complication ([place]) 30/16/0109  . Hypertension 01/24/2016  . Hyperlipidemia 01/24/2016    History reviewed. No pertinent surgical history.   OB History   No obstetric history on file.      Home Medications    Prior to Admission medications   Medication Sig Start Date End Date Taking? Authorizing Provider  cyclobenzaprine (FLEXERIL) 10 MG  tablet Take 0.5-1 tablets (5-10 mg total) by mouth 3 (three) times daily as needed. 11/07/18   Rutherford Guys, MD  olmesartan (BENICAR) 20 MG tablet Take 1 tablet (20 mg total) by mouth daily. 11/07/18   Rutherford Guys, MD  omeprazole (PRILOSEC) 40 MG capsule Take 1 capsule (40 mg total) by mouth daily. 01/25/19   Horald Pollen, MD    Family History Family History  Problem Relation Age of Onset  . Diabetes type II Mother   . Heart attack Mother   . Liver cancer Father   . Heart attack Brother     Social History Social History   Tobacco Use  . Smoking status: Former Smoker    Quit date: 11/21/2012    Years since quitting: 6.7  . Smokeless tobacco: Never Used  . Tobacco comment: social smoker  Substance Use Topics  . Alcohol use: No    Alcohol/week: 0.0 standard drinks  . Drug use: No     Allergies   Vicodin [hydrocodone-acetaminophen]   Review of Systems Review of Systems  Constitutional: Positive for fatigue. Negative for fever.  HENT: Negative for sore throat.   Eyes: Negative for visual disturbance.  Respiratory: Negative for shortness of breath.   Cardiovascular: Negative for chest pain and palpitations.  Gastrointestinal: Negative for abdominal pain, diarrhea, nausea and vomiting.  Genitourinary: Negative for dysuria.  Musculoskeletal: Negative for back pain.  Skin: Negative for rash.  Neurological: Positive for  dizziness, light-headedness and numbness. Negative for headaches.     Physical Exam Updated Vital Signs BP (!) 171/78 (BP Location: Right Arm)   Pulse 70   Resp 20   SpO2 100%   Physical Exam Vitals signs and nursing note reviewed.  Constitutional:      General: She is not in acute distress.    Appearance: She is well-developed.  HENT:     Head: Normocephalic and atraumatic.  Eyes:     Conjunctiva/sclera: Conjunctivae normal.  Neck:     Musculoskeletal: Neck supple.  Cardiovascular:     Rate and Rhythm: Normal rate and regular  rhythm.     Heart sounds: Normal heart sounds. No murmur.  Pulmonary:     Effort: Pulmonary effort is normal. No respiratory distress.     Breath sounds: Normal breath sounds.  Abdominal:     Palpations: Abdomen is soft.     Tenderness: There is no abdominal tenderness.  Musculoskeletal: Normal range of motion.     Right lower leg: She exhibits no tenderness. No edema.     Left lower leg: She exhibits no tenderness. No edema.     Comments: She is some tenderness through her left shoulder.  Distal pulses and strength intact.  Skin:    General: Skin is warm and dry.     Capillary Refill: Capillary refill takes less than 2 seconds.  Neurological:     General: No focal deficit present.     Mental Status: She is alert and oriented to person, place, and time.     Cranial Nerves: No cranial nerve deficit.     Motor: No weakness.     Gait: Gait normal.      ED Treatments / Results  Labs (all labs ordered are listed, but only abnormal results are displayed) Labs Reviewed  BASIC METABOLIC PANEL - Abnormal; Notable for the following components:      Result Value   Glucose, Bld 196 (*)    All other components within normal limits  CBC  TROPONIN I (HIGH SENSITIVITY)  TROPONIN I (HIGH SENSITIVITY)    EKG EKG Interpretation  Date/Time:  Wednesday August 07 2019 09:09:54 EDT Ventricular Rate:  72 PR Interval:  142 QRS Duration: 70 QT Interval:  360 QTC Calculation: 394 R Axis:   68 Text Interpretation:  Normal sinus rhythm Normal ECG similar to prior 4/09 Confirmed by Meridee ScoreButler, Ryo Klang [phone]([id number]) on 08/07/2019 11:03:44 AM   Radiology Dg Chest 2 View  Result Date: 08/07/2019 CLINICAL DATA:  Chest pain EXAM: CHEST - 2 VIEW COMPARISON:  09/26/2011 FINDINGS: Chronic interstitial coarsening with large lung volumes. There is no edema, consolidation, effusion, or pneumothorax. Normal heart size and mediastinal contours. IMPRESSION: Stable exam.  No acute finding. Electronically Signed    By: Marnee SpringJonathon  Watts M.D.   On: 08/07/2019 09:51    Procedures Procedures (including critical care time)  Medications Ordered in ED Medications - No data to display   Initial Impression / Assessment and Plan / ED Course  I have reviewed the triage vital signs and the nursing notes.  Pertinent labs & imaging results that were available during my care of the patient were reviewed by me and considered in my medical decision making (see chart for details).  Clinical Course as of Aug 06 1721  Wed Aug 07, 2019  1333 Patient here with left arm pain and also intermittent dizziness.  Both of these sound more longstanding and intermittent.  Differential includes stroke, tumor, vertigo, blood pressure  issues, musculoskeletal, ACS.  EKG and delta troponin negative.  Lab work fairly unremarkable other than a mildly high glucose of 196.  CT head shows no acute findings.  I reviewed all this with her and told her she needs to get back with her primary care doctor for better blood pressure management and may want to also reconnect with neurology regarding her dizziness and her intermittent weakness.   [MB]    Clinical Course User Index [MB] Terrilee Files, MD      Final Clinical Impressions(s) / ED Diagnoses   Final diagnoses:  Left arm pain  Dizziness  Hypertension, unspecified type    ED Discharge Orders    None       Terrilee Files, MD 08/07/19 1722

## 2019-08-07 NOTE — Discharge Instructions (Addendum)
You were seen in the emergency department for left arm pain and episodes of dizziness lightheadedness.  You had a CAT scan of your head chest x-ray blood work EKG that did not show any serious findings.  Your blood pressure remains elevated here and will be important for you to follow-up with your primary care doctor for further evaluation of this.  Please return to the emergency department if any concerns.

## 2019-08-07 NOTE — ED Notes (Signed)
Got patient into a gown on the monitor patient is resting with call bell in reach 

## 2019-08-07 NOTE — ED Notes (Signed)
Patient transported to CT 

## 2019-08-14 ENCOUNTER — Other Ambulatory Visit: Payer: Self-pay | Admitting: Family Medicine

## 2019-08-14 DIAGNOSIS — I10 Essential (primary) hypertension: Secondary | ICD-10-CM

## 2019-08-14 NOTE — Telephone Encounter (Signed)
Requested medication (s) are due for refill today: yes  Requested medication (s) are on the active medication list: yes  Last refill:  05/12/2019  Future visit scheduled: no  Notes to clinic:  Review for refill   Requested Prescriptions  Pending Prescriptions Disp Refills   olmesartan (BENICAR) 20 MG tablet [Pharmacy Med Name: OLMESARTAN MEDOXOMIL 20 MG TAB] 90 tablet 0    Sig: TAKE ONE TABLET BY MOUTH DAILY     Cardiovascular:  Angiotensin Receptor Blockers Failed - 08/14/2019  6:21 AM      Failed - Last BP in normal range    BP Readings from Last 1 Encounters:  08/07/19 (!) 171/78         Failed - Valid encounter within last 6 months    Recent Outpatient Visits          6 months ago Epigastric discomfort   [facility], Ines Bloomer, MD   9 months ago Sore throat   [facility], Lilia Argue, MD   10 months ago Elevated liver enzymes   [facility], [place] D, PA-C   11 months ago Elevated liver enzymes   [facility], [place] D, PA-C   11 months ago Abdominal pain, epigastric   [facility], [place] D, PA-C             Passed - Cr in normal range and within 180 days    Creat  Date Value Ref Range Status  07/13/2016 0.65 0.50 - 0.99 mg/dL Final    Comment:      For patients > or = 66 years of age: The upper reference limit for Creatinine is approximately 13% higher for people identified as African-American.      Creatinine, Ser  Date Value Ref Range Status  08/07/2019 0.75 0.44 - 1.00 mg/dL Final         Passed - K in normal range and within 180 days    Potassium  Date Value Ref Range Status  08/07/2019 4.4 3.5 - 5.1 mmol/L Final         Passed - Patient is not pregnant

## 2019-08-15 DIAGNOSIS — I10 Essential (primary) hypertension: Secondary | ICD-10-CM | POA: Diagnosis not present

## 2019-08-15 DIAGNOSIS — Z1211 Encounter for screening for malignant neoplasm of colon: Secondary | ICD-10-CM | POA: Diagnosis not present

## 2019-08-15 DIAGNOSIS — Z0000 Encounter for general adult medical examination without abnormal findings: Secondary | ICD-10-CM | POA: Diagnosis not present

## 2019-08-15 DIAGNOSIS — Z23 Encounter for immunization: Secondary | ICD-10-CM | POA: Diagnosis not present

## 2019-08-26 DIAGNOSIS — Z1211 Encounter for screening for malignant neoplasm of colon: Secondary | ICD-10-CM | POA: Diagnosis not present

## 2019-08-26 DIAGNOSIS — R531 Weakness: Secondary | ICD-10-CM | POA: Diagnosis not present

## 2019-08-26 DIAGNOSIS — I10 Essential (primary) hypertension: Secondary | ICD-10-CM | POA: Diagnosis not present

## 2019-09-02 DIAGNOSIS — I10 Essential (primary) hypertension: Secondary | ICD-10-CM | POA: Diagnosis not present

## 2019-09-10 DIAGNOSIS — R945 Abnormal results of liver function studies: Secondary | ICD-10-CM | POA: Diagnosis not present

## 2019-09-10 DIAGNOSIS — R7401 Elevation of levels of liver transaminase levels: Secondary | ICD-10-CM | POA: Diagnosis not present

## 2019-09-12 DIAGNOSIS — M542 Cervicalgia: Secondary | ICD-10-CM | POA: Diagnosis not present

## 2019-09-12 DIAGNOSIS — M5416 Radiculopathy, lumbar region: Secondary | ICD-10-CM | POA: Diagnosis not present

## 2019-09-12 DIAGNOSIS — M5412 Radiculopathy, cervical region: Secondary | ICD-10-CM | POA: Diagnosis not present

## 2019-09-12 DIAGNOSIS — G5600 Carpal tunnel syndrome, unspecified upper limb: Secondary | ICD-10-CM | POA: Diagnosis not present

## 2019-09-13 DIAGNOSIS — R531 Weakness: Secondary | ICD-10-CM | POA: Diagnosis not present

## 2019-09-18 DIAGNOSIS — I10 Essential (primary) hypertension: Secondary | ICD-10-CM | POA: Diagnosis not present

## 2019-09-19 DIAGNOSIS — M4807 Spinal stenosis, lumbosacral region: Secondary | ICD-10-CM | POA: Diagnosis not present

## 2019-09-19 DIAGNOSIS — M50221 Other cervical disc displacement at C4-C5 level: Secondary | ICD-10-CM | POA: Diagnosis not present

## 2019-09-19 DIAGNOSIS — M50321 Other cervical disc degeneration at C4-C5 level: Secondary | ICD-10-CM | POA: Diagnosis not present

## 2019-09-19 DIAGNOSIS — M5416 Radiculopathy, lumbar region: Secondary | ICD-10-CM | POA: Diagnosis not present

## 2019-09-19 DIAGNOSIS — M5412 Radiculopathy, cervical region: Secondary | ICD-10-CM | POA: Diagnosis not present

## 2019-09-19 DIAGNOSIS — M4802 Spinal stenosis, cervical region: Secondary | ICD-10-CM | POA: Diagnosis not present

## 2019-09-19 DIAGNOSIS — M50322 Other cervical disc degeneration at C5-C6 level: Secondary | ICD-10-CM | POA: Diagnosis not present

## 2019-09-24 DIAGNOSIS — M542 Cervicalgia: Secondary | ICD-10-CM | POA: Diagnosis not present

## 2019-09-24 DIAGNOSIS — M5440 Lumbago with sciatica, unspecified side: Secondary | ICD-10-CM | POA: Diagnosis not present

## 2019-09-24 DIAGNOSIS — G5600 Carpal tunnel syndrome, unspecified upper limb: Secondary | ICD-10-CM | POA: Diagnosis not present

## 2019-10-02 DIAGNOSIS — G5601 Carpal tunnel syndrome, right upper limb: Secondary | ICD-10-CM | POA: Diagnosis not present

## 2019-10-08 DIAGNOSIS — L03119 Cellulitis of unspecified part of limb: Secondary | ICD-10-CM | POA: Diagnosis not present

## 2019-10-14 DIAGNOSIS — M25531 Pain in right wrist: Secondary | ICD-10-CM | POA: Diagnosis not present

## 2019-11-08 ENCOUNTER — Other Ambulatory Visit: Payer: Self-pay | Admitting: Family Medicine

## 2019-11-08 DIAGNOSIS — I10 Essential (primary) hypertension: Secondary | ICD-10-CM

## 2019-11-08 NOTE — Telephone Encounter (Signed)
Forwarding medication refill request to the clinical pool for review. 

## 2019-12-05 ENCOUNTER — Other Ambulatory Visit: Payer: Self-pay | Admitting: Emergency Medicine

## 2019-12-05 DIAGNOSIS — R1013 Epigastric pain: Secondary | ICD-10-CM

## 2019-12-05 DIAGNOSIS — Z8711 Personal history of peptic ulcer disease: Secondary | ICD-10-CM

## 2019-12-17 ENCOUNTER — Other Ambulatory Visit: Payer: Self-pay | Admitting: Family Medicine

## 2019-12-17 ENCOUNTER — Ambulatory Visit
Admission: RE | Admit: 2019-12-17 | Discharge: 2019-12-17 | Disposition: A | Discharge status: Home / Self Care | Payer: Medicare Other | Source: Ambulatory Visit | Attending: Family Medicine | Admitting: Family Medicine

## 2019-12-17 DIAGNOSIS — U071 COVID-19: Secondary | ICD-10-CM | POA: Diagnosis not present

## 2019-12-17 IMAGING — CR DG CHEST 2V
2 series · 2 of 2 positions shown · non-contrast
Comparison: [DATE]

CLINICAL DATA: [P7].

EXAM:
CHEST - 2 VIEW

[w chest pa]
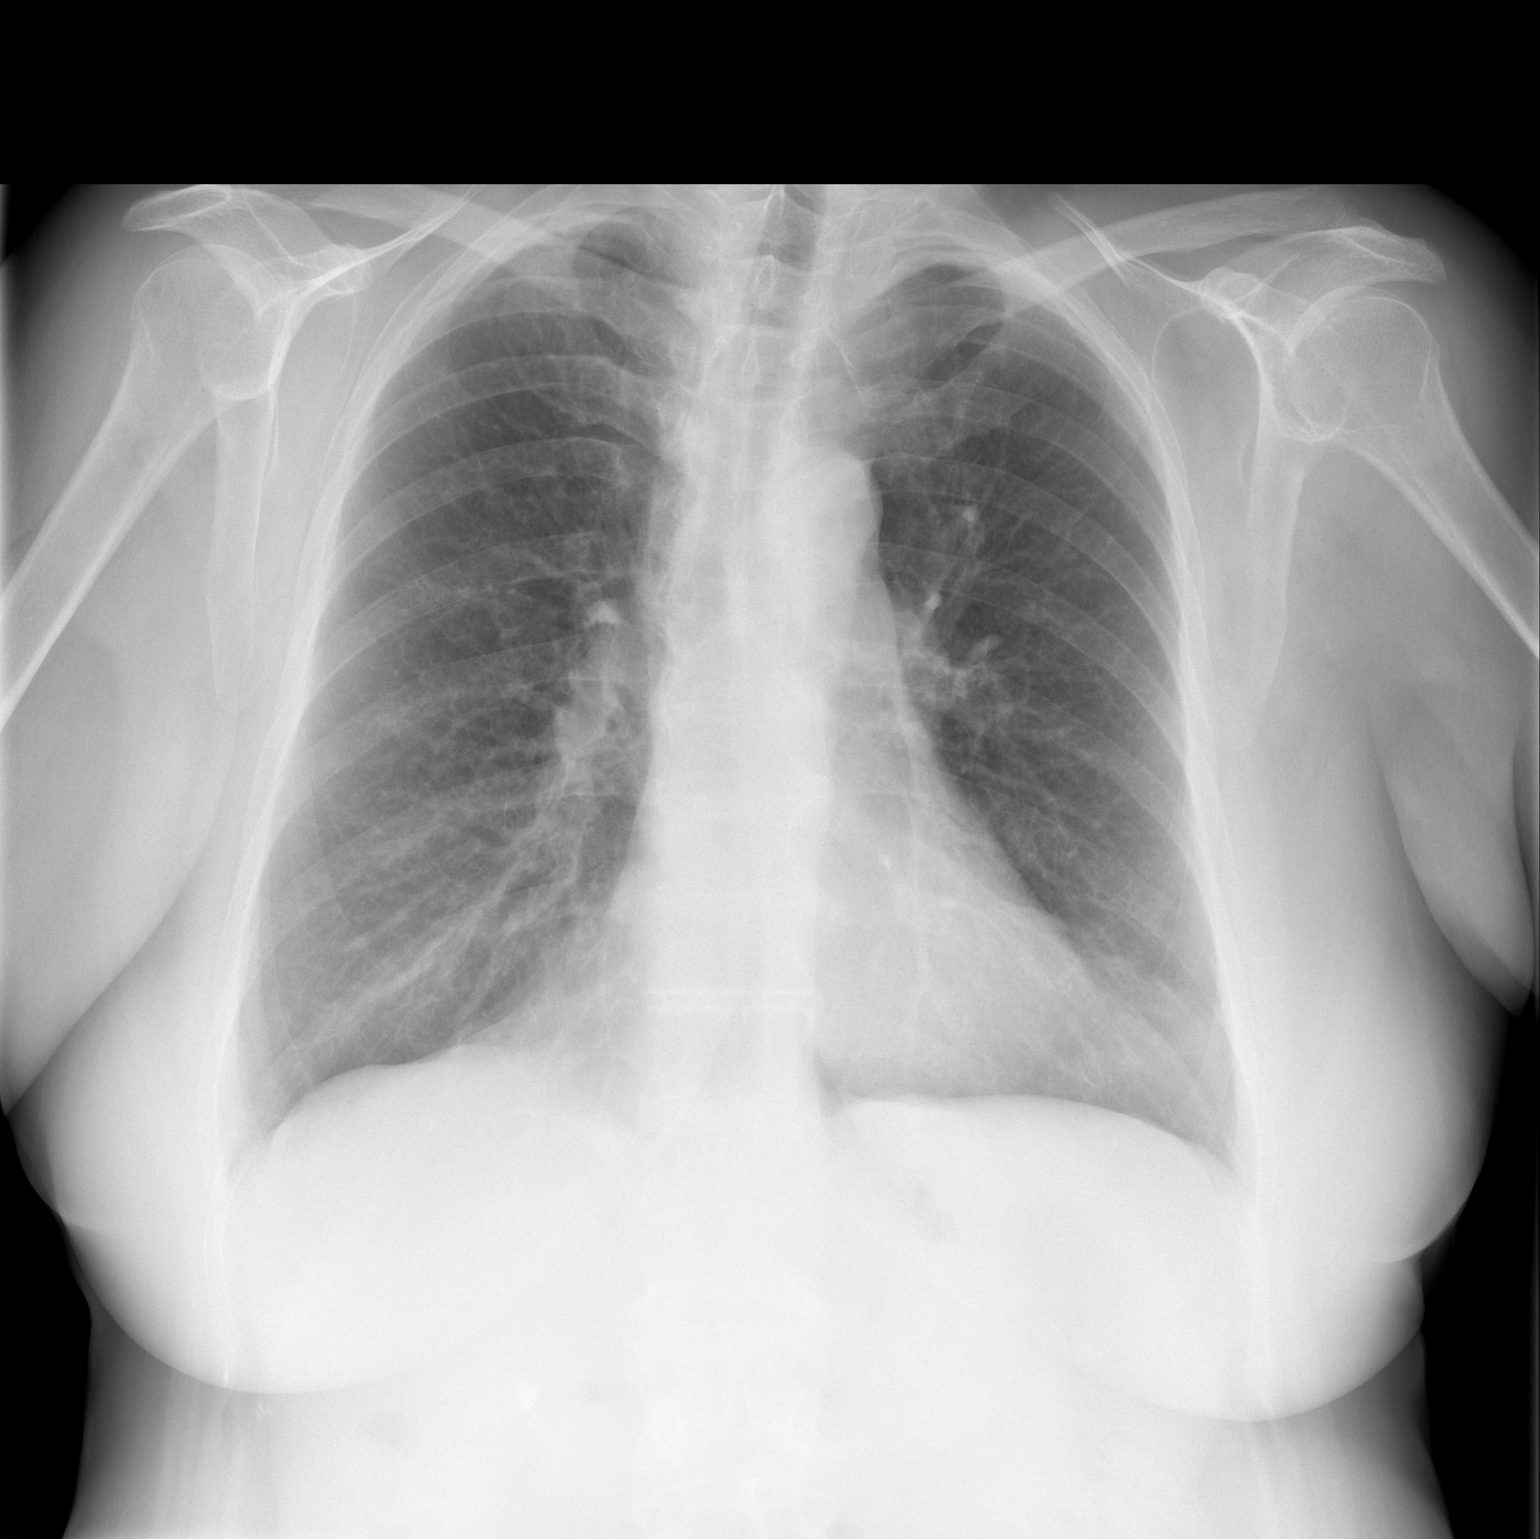

[w chest lat]
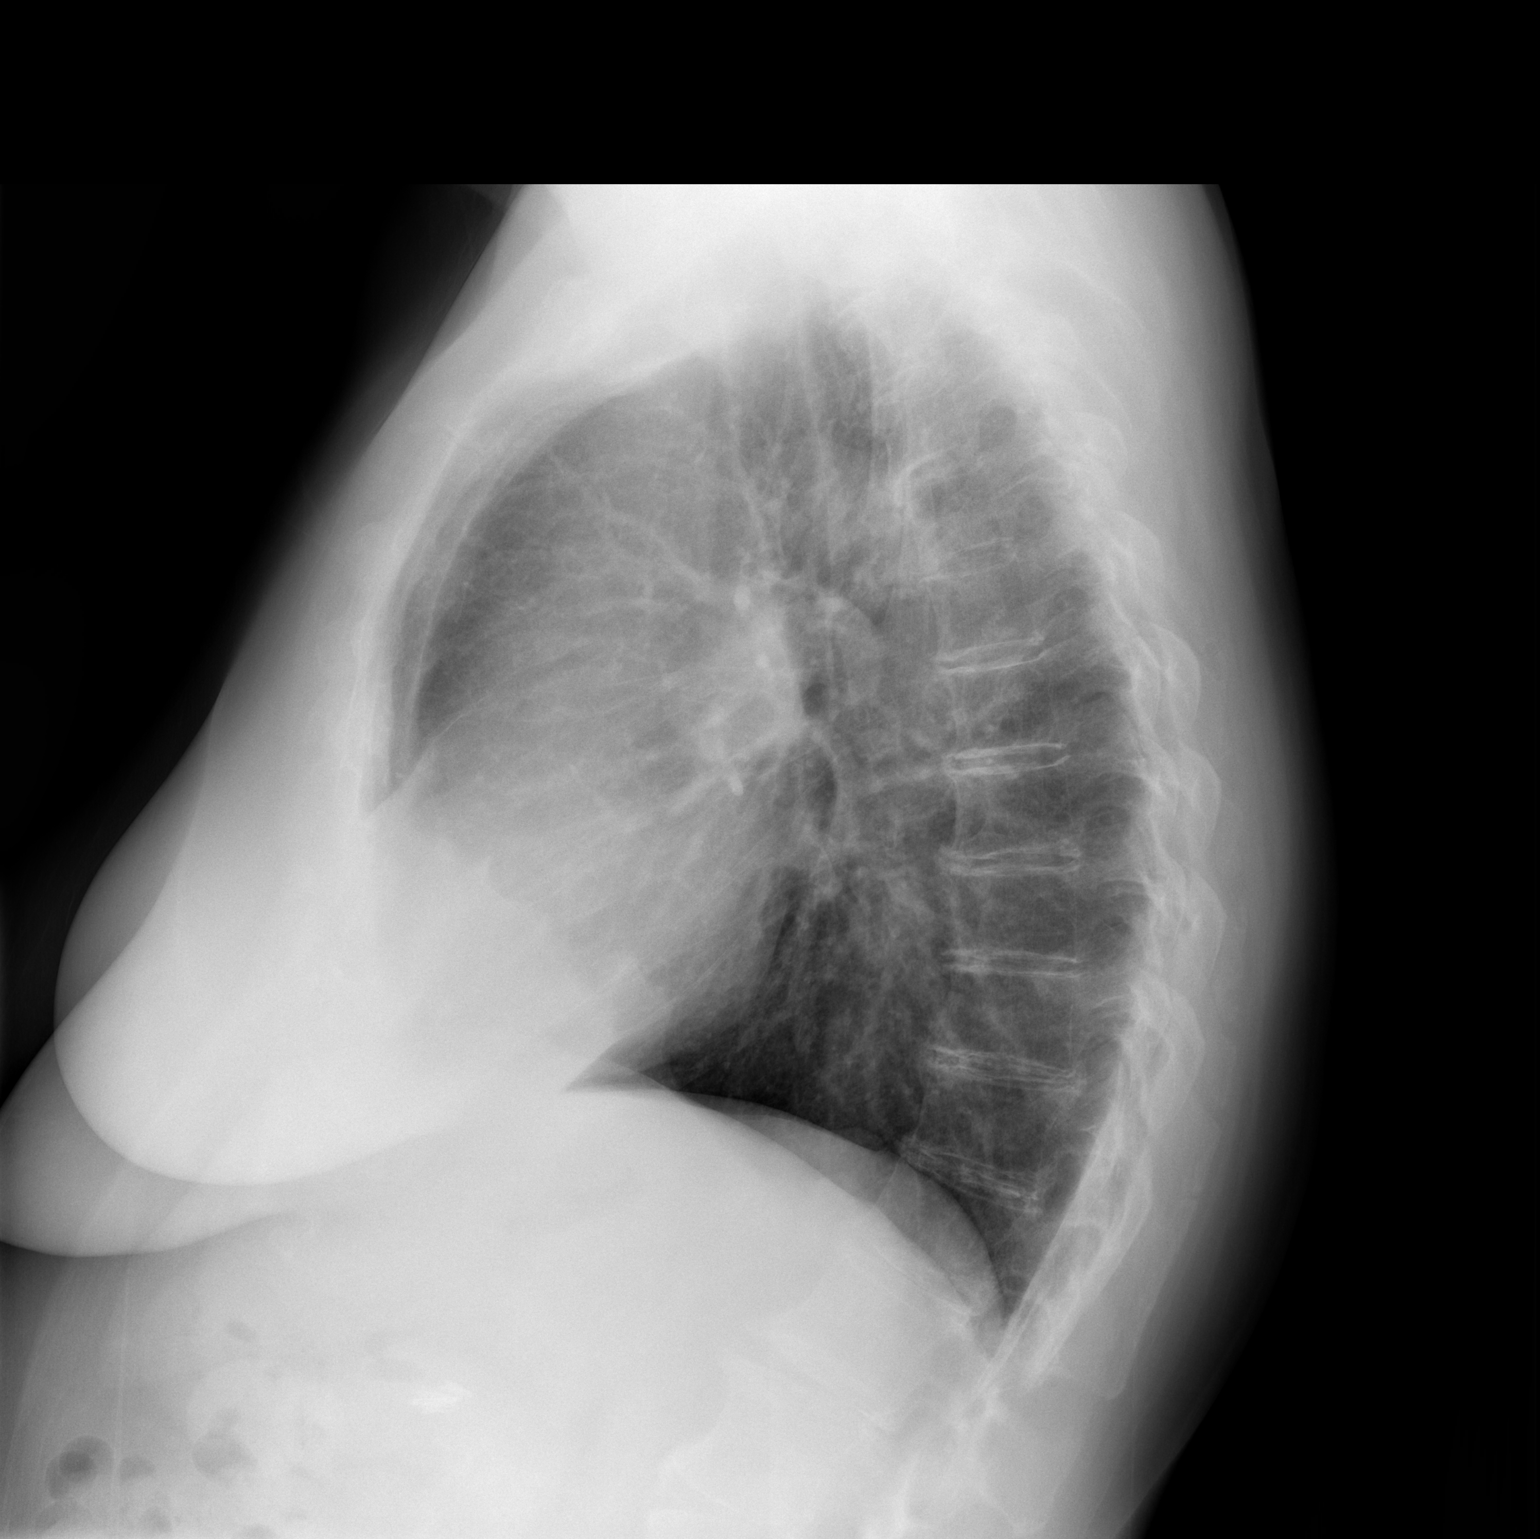

[2 of 2 positions shown; findings below may reference images not displayed]

FINDINGS: There is no evidence of acute infiltrate, pleural effusion or
pneumothorax. The heart size and mediastinal contours are within
normal limits. Degenerative changes seen within the thoracic spine.
IMPRESSION: No active cardiopulmonary disease.

## 2020-02-03 ENCOUNTER — Other Ambulatory Visit: Payer: Self-pay | Admitting: Family Medicine

## 2020-02-03 DIAGNOSIS — I10 Essential (primary) hypertension: Secondary | ICD-10-CM

## 2020-02-11 NOTE — Telephone Encounter (Signed)
Called pt per pt she has found another dr and will not be coming back.

## 2020-02-24 DIAGNOSIS — G8929 Other chronic pain: Secondary | ICD-10-CM | POA: Diagnosis not present

## 2020-03-03 ENCOUNTER — Other Ambulatory Visit: Payer: Self-pay | Admitting: Family Medicine

## 2020-03-03 ENCOUNTER — Telehealth: Payer: Self-pay

## 2020-03-03 DIAGNOSIS — I10 Essential (primary) hypertension: Secondary | ICD-10-CM

## 2020-03-03 NOTE — Telephone Encounter (Signed)
Patient stated has changed providers and will not be needing any medication refills.

## 2020-03-03 NOTE — Telephone Encounter (Signed)
Requested medications are due for refill today? Yes  Requested medications are on active medication list?  Yes  Last Refill:   02/04/2020  # 30 with no refills.  Noted this was  a courtesy refill and patient would need to make an appt for additional refills.    Future visit scheduled? No  Notes to Clinic:    Medication failed RX refill protocol due to no valid encounter in last 6 months and no lab work in the last 180 days.

## 2020-03-06 DIAGNOSIS — R7301 Impaired fasting glucose: Secondary | ICD-10-CM | POA: Diagnosis not present

## 2020-03-06 DIAGNOSIS — G8929 Other chronic pain: Secondary | ICD-10-CM | POA: Diagnosis not present

## 2020-03-06 DIAGNOSIS — I10 Essential (primary) hypertension: Secondary | ICD-10-CM | POA: Diagnosis not present

## 2020-03-07 ENCOUNTER — Other Ambulatory Visit: Payer: Self-pay | Admitting: Emergency Medicine

## 2020-03-07 DIAGNOSIS — Z8711 Personal history of peptic ulcer disease: Secondary | ICD-10-CM

## 2020-03-07 DIAGNOSIS — R1013 Epigastric pain: Secondary | ICD-10-CM

## 2020-03-07 NOTE — Telephone Encounter (Signed)
Requested  medications are  due for refill today yes  Requested medications are on the active medication list yes  Last refill 3/18  Future visit scheduled no  Notes to clinic No PCP and past 6 months for office visit

## 2020-03-16 DIAGNOSIS — M2550 Pain in unspecified joint: Secondary | ICD-10-CM | POA: Diagnosis not present

## 2020-03-16 DIAGNOSIS — R609 Edema, unspecified: Secondary | ICD-10-CM | POA: Diagnosis not present

## 2020-03-16 DIAGNOSIS — R5382 Chronic fatigue, unspecified: Secondary | ICD-10-CM | POA: Diagnosis not present

## 2020-03-16 DIAGNOSIS — R7303 Prediabetes: Secondary | ICD-10-CM | POA: Diagnosis not present

## 2020-03-21 ENCOUNTER — Other Ambulatory Visit: Payer: Self-pay | Admitting: Family Medicine

## 2020-03-21 DIAGNOSIS — I10 Essential (primary) hypertension: Secondary | ICD-10-CM

## 2020-03-21 NOTE — Telephone Encounter (Signed)
Requested medication (s) are due for refill today: yes  Requested medication (s) are on the active medication list: yes  Last refill:  02/04/20 courtesy refill  Future visit scheduled: no  Notes to clinic:  pt > 3 months overdue for OV. [facility] no longer makes appt for this practive   Requested Prescriptions  Pending Prescriptions Disp Refills   olmesartan (BENICAR) 20 MG tablet [Pharmacy Med Name: OLMESARTAN MEDOXOMIL 20 MG TAB] 30 tablet 0    Sig: TAKE ONE TABLET BY MOUTH DAILY - COURTESY REFILL NO MORE REFILLS WITHOUT AN APPOINTMENT      Cardiovascular:  Angiotensin Receptor Blockers Failed - 03/21/2020  1:44 PM      Failed - Cr in normal range and within 180 days    Creat  Date Value Ref Range Status  07/13/2016 0.65 0.50 - 0.99 mg/dL Final    Comment:      For patients > or = 67 years of age: The upper reference limit for Creatinine is approximately 13% higher for people identified as African-American.      Creatinine, Ser  Date Value Ref Range Status  08/07/2019 0.75 0.44 - 1.00 mg/dL Final          Failed - K in normal range and within 180 days    Potassium  Date Value Ref Range Status  08/07/2019 4.4 3.5 - 5.1 mmol/L Final          Failed - Last BP in normal range    BP Readings from Last 1 Encounters:  08/07/19 (!) 171/78          Failed - Valid encounter within last 6 months    Recent Outpatient Visits           1 year ago Epigastric discomfort   [facility], Eilleen Kempf, MD   1 year ago Sore throat   [facility], Meda Coffee, MD   1 year ago Elevated liver enzymes   [facility], [place] D, PA-C   1 year ago Elevated liver enzymes   [facility], [place] D, PA-C   1 year ago Abdominal pain, epigastric   [facility], Gerald Stabs, [place]              Passed - Patient is not pregnant

## 2020-03-30 DIAGNOSIS — M2550 Pain in unspecified joint: Secondary | ICD-10-CM | POA: Diagnosis not present

## 2020-03-30 DIAGNOSIS — M542 Cervicalgia: Secondary | ICD-10-CM | POA: Diagnosis not present

## 2020-03-30 DIAGNOSIS — R5382 Chronic fatigue, unspecified: Secondary | ICD-10-CM | POA: Diagnosis not present

## 2020-04-05 ENCOUNTER — Other Ambulatory Visit: Payer: Self-pay | Admitting: Family Medicine

## 2020-04-05 DIAGNOSIS — I10 Essential (primary) hypertension: Secondary | ICD-10-CM

## 2020-04-08 ENCOUNTER — Ambulatory Visit (INDEPENDENT_AMBULATORY_CARE_PROVIDER_SITE_OTHER): Payer: Medicare Other | Admitting: Family Medicine

## 2020-04-08 ENCOUNTER — Other Ambulatory Visit: Payer: Self-pay

## 2020-04-08 ENCOUNTER — Encounter: Payer: Self-pay | Admitting: Family Medicine

## 2020-04-08 DIAGNOSIS — Z8711 Personal history of peptic ulcer disease: Secondary | ICD-10-CM

## 2020-04-08 DIAGNOSIS — M542 Cervicalgia: Secondary | ICD-10-CM

## 2020-04-08 DIAGNOSIS — M65341 Trigger finger, right ring finger: Secondary | ICD-10-CM

## 2020-04-08 DIAGNOSIS — G8929 Other chronic pain: Secondary | ICD-10-CM

## 2020-04-08 DIAGNOSIS — R5383 Other fatigue: Secondary | ICD-10-CM

## 2020-04-08 DIAGNOSIS — E559 Vitamin D deficiency, unspecified: Secondary | ICD-10-CM | POA: Diagnosis not present

## 2020-04-08 DIAGNOSIS — E119 Type 2 diabetes mellitus without complications: Secondary | ICD-10-CM

## 2020-04-08 DIAGNOSIS — M545 Low back pain, unspecified: Secondary | ICD-10-CM

## 2020-04-08 DIAGNOSIS — I10 Essential (primary) hypertension: Secondary | ICD-10-CM | POA: Diagnosis not present

## 2020-04-08 DIAGNOSIS — M25512 Pain in left shoulder: Secondary | ICD-10-CM

## 2020-04-08 DIAGNOSIS — M25511 Pain in right shoulder: Secondary | ICD-10-CM

## 2020-04-08 DIAGNOSIS — E785 Hyperlipidemia, unspecified: Secondary | ICD-10-CM

## 2020-04-08 MED ORDER — HYDROCHLOROTHIAZIDE 12.5 MG PO TABS: 12.5000 mg | ORAL_TABLET | Freq: Every day | ORAL | 6 refills | Status: DC

## 2020-04-08 MED ORDER — OLMESARTAN MEDOXOMIL 20 MG PO TABS: 20.0000 mg | ORAL_TABLET | Freq: Every day | ORAL | 6 refills | Status: DC

## 2020-04-08 NOTE — Progress Notes (Signed)
Office Visit Note   Patient: Morgan Ruiz           Date of Birth: May 14, 1951           MRN: [id number] Visit Date: 04/08/2020 Requested by: No referring provider defined for this encounter. PCP: Patient, No Pcp Per  Subjective: Chief Complaint  Patient presents with  . Neck - Pain  . Right Shoulder - Pain    Pain x several months, with decreased ROM.  Marland Kitchen Left Shoulder - Pain    Chronic pain in the left upper arm/shoulder.    HPI: She is here to reestablish care.  I first saw her about 20 years ago.  She has multiple issues to discuss today.  From a blood pressure standpoint, she has had hypertension ever since her older brother died about 5 years ago.  It has been difficult to control.  Blood pressure was better with amlodipine, but it caused significant swelling in her legs.  She was then placed on Benicar which has not caused any side effects, but it is not controlling her blood pressure.  She is asymptomatic from a blood pressure standpoint.  She has a history of diabetes which has been managed with diet.  She has been struggling with neck pain with a numbness sensation in her upper back, and pain into both shoulders.  She gets occasional tingling into her fingers mostly on the ulnar side of her hands.    She is also having triggering symptoms in her right fourth finger.  Her low back has bothered her chronically.  She had surgery per Dr. Wynetta Emery which gave her temporary relief.  She has been struggling with fatigue.  She has a history of severe vitamin D deficiency with a lung level of 7.6 in 2011.  She is on over-the-counter vitamin D3 and has not had her levels checked in a while.  She recently had negative serologies per rheumatology.                ROS:   All other systems were reviewed and are negative.  Objective: Vital Signs: There were no vitals taken for this visit.  Physical Exam:  General:  Alert and oriented, in no acute distress. Pulm:  Breathing  unlabored. Psy:  Normal mood, congruent affect. Skin: No rash Neck: She has pain with Spurling's test into both shoulders.  Range of motion is slightly limited with extension and rotation bilaterally.  Upper extremity strength and reflexes are normal today.  She has no carotid bruits, no thyromegaly or nodules. CV: Regular rate and rhythm without murmurs, rubs, or gallops.  No peripheral edema.  2+ radial and posterior tibial pulses. Lungs: Clear to auscultation throughout with no wheezing or areas of consolidation. Right hand: She has a tender nodule at the A1 pulley of the fourth finger.  She is unable to fully flex the finger.  There is no active triggering today. Low back: Tender in the paraspinous muscles bilaterally.  Straight leg raise negative, lower extremity strength and reflexes are normal.   Imaging: No results found.  Assessment & Plan: 1.  Hypertension -We will continue with Benicar and add hydrochlorothiazide.  Follow-up in about 4 weeks for recheck.  2.  Diabetes, controlled with diet. -Labs in about 3 to 6 months.  3.  Chronic neck and shoulder pain -Trial of physical therapy.  If symptoms persist, x-rays and MRI scan.  4.  Chronic low back pain -Physical therapy.  5.  Right fourth trigger finger -  Physical therapy.  Could contemplate injection if symptoms persist.  6.  Chronic fatigue -Labs to evaluate.  7.  Vitamin D deficiency -Recheck levels today.     Procedures: No procedures performed  No notes on file     PMFS History: Patient Active Problem List   Diagnosis Date Noted  . Vitamin D deficiency 04/08/2020  . Epigastric discomfort 01/25/2019  . History of peptic ulcer disease 01/25/2019  . Diabetes mellitus without complication ([place]) 37/85/8850  . Hypertension 01/24/2016  . Hyperlipidemia 01/24/2016   Past Medical History:  Diagnosis Date  . Diabetes mellitus without complication ([place])   . Hyperlipidemia   . Hypertension     Family  History  Problem Relation Age of Onset  . Diabetes type II Mother   . Heart attack Mother   . Liver cancer Father   . Heart attack Brother     History reviewed. No pertinent surgical history. Social History   Occupational History  . Occupation: Emergency planning/management officer  Tobacco Use  . Smoking status: Former Smoker    Quit date: 11/21/2012    Years since quitting: 7.3  . Smokeless tobacco: Never Used  . Tobacco comment: social smoker  Substance and Sexual Activity  . Alcohol use: No    Alcohol/week: 0.0 standard drinks  . Drug use: No  . Sexual activity: Yes

## 2020-04-09 ENCOUNTER — Telehealth: Payer: Self-pay | Admitting: Family Medicine

## 2020-04-09 LAB — COMPREHENSIVE METABOLIC PANEL
AG Ratio: 1.5 (calc) (ref 1.0–2.5)
ALT: 28 U/L (ref 6–29)
AST: 24 U/L (ref 10–35)
Albumin: 4.4 g/dL (ref 3.6–5.1)
Alkaline phosphatase (APISO): 67 U/L (ref 37–153)
BUN: 17 mg/dL (ref 7–25)
CO2: 28 mmol/L (ref 20–32)
Calcium: 10 mg/dL (ref 8.6–10.4)
Chloride: 103 mmol/L (ref 98–110)
Creat: 0.72 mg/dL (ref 0.50–0.99)
Globulin: 2.9 g/dL (calc) (ref 1.9–3.7)
Glucose, Bld: 154 mg/dL — ABNORMAL HIGH (ref 65–99)
Potassium: 5.2 mmol/L (ref 3.5–5.3)
Sodium: 138 mmol/L (ref 135–146)
Total Bilirubin: 0.5 mg/dL (ref 0.2–1.2)
Total Protein: 7.3 g/dL (ref 6.1–8.1)

## 2020-04-09 LAB — CBC WITH DIFFERENTIAL/PLATELET
Absolute Monocytes: 450 cells/uL (ref 200–950)
Basophils Absolute: 60 cells/uL (ref 0–200)
Basophils Relative: 0.8 %
Eosinophils Absolute: 203 cells/uL (ref 15–500)
Eosinophils Relative: 2.7 %
HCT: 40.7 % (ref 35.0–45.0)
Hemoglobin: 13.7 g/dL (ref 11.7–15.5)
Lymphs Abs: 2048 cells/uL (ref 850–3900)
MCH: 29.6 pg (ref 27.0–33.0)
MCHC: 33.7 g/dL (ref 32.0–36.0)
MCV: 87.9 fL (ref 80.0–100.0)
MPV: 10.1 fL (ref 7.5–12.5)
Monocytes Relative: 6 %
Neutro Abs: 4740 cells/uL (ref 1500–7800)
Neutrophils Relative %: 63.2 %
Platelets: 376 10*3/uL (ref 140–400)
RBC: 4.63 10*6/uL (ref 3.80–5.10)
RDW: 12.2 % (ref 11.0–15.0)
Total Lymphocyte: 27.3 %
WBC: 7.5 10*3/uL (ref 3.8–10.8)

## 2020-04-09 LAB — THYROID PANEL WITH TSH
Free Thyroxine Index: 3.3 (ref 1.4–3.8)
T3 Uptake: 26 % (ref 22–35)
T4, Total: 12.6 ug/dL — ABNORMAL HIGH (ref 5.1–11.9)
TSH: 0.57 mIU/L (ref 0.40–4.50)

## 2020-04-09 LAB — IRON,TIBC AND FERRITIN PANEL
%SAT: 25 % (calc) (ref 16–45)
Ferritin: 65 ng/mL (ref 16–288)
Iron: 80 ug/dL (ref 45–160)
TIBC: 321 mcg/dL (calc) (ref 250–450)

## 2020-04-09 LAB — VITAMIN D 25 HYDROXY (VIT D DEFICIENCY, FRACTURES): Vit D, 25-Hydroxy: 25 ng/mL — ABNORMAL LOW (ref 30–100)

## 2020-04-09 NOTE — Telephone Encounter (Signed)
Labs are notable for the following:  Thyroid studies look good, and there is no sign of anemia.  Blood sugar is elevated at 154, but I do not recall whether this was a fasting specimen.  It remains important to exercise regularly such as walking most days of the week, and also to limit dietary intake of breads, pastas, cereals, sugars and sweets.  We should recheck A1c in about 4 to 6 months.  Vitamin D is low at 25.  We want this to be 50-80.  I recommend taking vitamin D3, 5000 IU tablets, 2 of them daily for 3 months and then 1 daily long-term after that.  We will recheck this in 4 to 6 months as well.  Low vitamin D level can be associated with musculoskeletal pain, as well as decreased immune function and weakening of the bones.

## 2020-04-10 ENCOUNTER — Telehealth: Payer: Self-pay | Admitting: Family Medicine

## 2020-04-10 MED ORDER — NITROFURANTOIN MONOHYD MACRO 100 MG PO CAPS: 100.0000 mg | ORAL_CAPSULE | Freq: Two times a day (BID) | ORAL | 0 refills | Status: DC

## 2020-04-10 NOTE — Telephone Encounter (Signed)
Rx sent for macrobid.  We'll see if this eliminates her symptoms.

## 2020-04-10 NOTE — Addendum Note (Signed)
Addended by: Lillia Carmel on: 04/10/2020 01:20 PM   Modules accepted: Orders

## 2020-04-10 NOTE — Telephone Encounter (Signed)
I advised the patient of her lab results and instructions regarding diet, exercise, and starting vitamin D3. She said she was already taking vitamin D3, but only 2,000 iu qd. She voice understanding in increasing to 10,000 iu total daily x 3 months (take 5 of the 2,000 iu since she already has those, or 2 of the 5,000 iu) and then go to 5,000 iu daily.  She complains of pain around her kidneys x 5-6 months. Sometimes her urine is dark and has more of an odor. She has been urinating more frequently and has occasional feelings of urgency - the patient is unsure of duration of these symptoms. No dysuria. Please advise.

## 2020-04-10 NOTE — Telephone Encounter (Signed)
I called the patient back about her lab results. See other message on this from today.

## 2020-04-10 NOTE — Telephone Encounter (Signed)
Patient called.   Returning a call from our office. I told her I would make her providers aware   Call back: [phone]

## 2020-04-10 NOTE — Telephone Encounter (Signed)
I called the patient and advised her of the antibiotic that was sent in for her. She is to let us know if her symptoms do not improve with this course of treatment.

## 2020-04-10 NOTE — Telephone Encounter (Signed)
Left a voice message on the patient's mobile to call me back regarding her lab results.

## 2020-04-16 ENCOUNTER — Other Ambulatory Visit: Payer: Self-pay

## 2020-04-16 ENCOUNTER — Encounter: Payer: Self-pay | Admitting: Physical Therapy

## 2020-04-16 ENCOUNTER — Ambulatory Visit: Payer: Medicare Other | Attending: Family Medicine | Admitting: Physical Therapy

## 2020-04-16 VITALS — BP 117/67 | HR 77

## 2020-04-16 DIAGNOSIS — M79644 Pain in right finger(s): Secondary | ICD-10-CM | POA: Insufficient documentation

## 2020-04-16 DIAGNOSIS — M542 Cervicalgia: Secondary | ICD-10-CM | POA: Diagnosis not present

## 2020-04-16 DIAGNOSIS — M5441 Lumbago with sciatica, right side: Secondary | ICD-10-CM | POA: Insufficient documentation

## 2020-04-16 DIAGNOSIS — M25512 Pain in left shoulder: Secondary | ICD-10-CM | POA: Insufficient documentation

## 2020-04-16 DIAGNOSIS — M6281 Muscle weakness (generalized): Secondary | ICD-10-CM

## 2020-04-16 DIAGNOSIS — M5442 Lumbago with sciatica, left side: Secondary | ICD-10-CM | POA: Insufficient documentation

## 2020-04-16 DIAGNOSIS — R262 Difficulty in walking, not elsewhere classified: Secondary | ICD-10-CM | POA: Insufficient documentation

## 2020-04-16 DIAGNOSIS — M25511 Pain in right shoulder: Secondary | ICD-10-CM | POA: Insufficient documentation

## 2020-04-16 DIAGNOSIS — G8929 Other chronic pain: Secondary | ICD-10-CM

## 2020-04-16 NOTE — Therapy (Signed)
[facility] [address]  [address] [place], [place], [address] Phone: [phone]   Fax:  [phone]  Physical Therapy Evaluation  Patient Details  Name: Morgan RipaLjiljana Kadlec MRN: [id number] Date of Birth: March 04, 1951 Referring Provider (PT): Lavada MesiMichael Hilts, MD   Encounter Date: 04/16/2020  PT End of Session - 04/16/20 1653    Visit Number  1    Number of Visits  17    Date for PT Re-Evaluation  06/11/20    Authorization Type  [employer] Medicare    PT Start Time  1019    PT Stop Time  1139   increased time required for assessment d/t multiple diagnoses   PT Time Calculation (min)  80 min    Activity Tolerance  Patient tolerated treatment well;Patient limited by pain    Behavior During Therapy  [facility] for tasks assessed/performed       Past Medical History:  Diagnosis Date  . Diabetes mellitus without complication (HCC)   . Hyperlipidemia   . Hypertension     History reviewed. No pertinent surgical history.  Vitals:   04/16/20 1051  BP: 117/67  Pulse: 77  SpO2: 96%     Subjective Assessment - 04/16/20 1022    Subjective  Patient reports that she has had neck pain for 21 years- believes that pain first began as a result of heavy lifting at work. Pain occurs over B lower posterior neck, worse L vs. R. Aggravated by L rotation and extension. Does note N/T and pain down B UEs down to fingers- mentions a hx of B CTS. L shoulder pain first began 3-4 years ago with radiation down the arm, with R shoulder pain beginning sometime later and radiating to the R elbow. Aggravated with overhead reaching as if to comb her hair. Had a R carpal tunnel release in 2019 which did not seem to give her relief. Now noticing difficulty making a first d/t pain and stiffness in the R 4th digit; pain occurs most in the MCP joint. LBP began 6-7 months before her surgery in 2018. Not sure what type of surgery she had but it was emergency surgery. Pain radiates  down B LEs stopping at knees but experiences N/T down to the toes. Denies changes in B&B control. Does mention significant weakness in B anterior thighs. Pain is worse with rotation, prolonged standing d/t feeling of pressure. Patient currently works part time as a Scientist, clinical (histocompatibility and immunogenetics)med tech.    Pertinent History  HTN, HLD, DM, back surgery 2018, R carpal tunnel release 2019    Limitations  Sitting;Lifting;Reading;Standing;Walking;Writing;House hold activities    How long can you sit comfortably?  variable,  better if sitting on a high seat    How long can you stand comfortably?  variable 5-20 minutes    How long can you walk comfortably?  variable    Diagnostic tests  lumbar MRI 11/15/16: There is multilevel degenerative disc disease and facet joint arthropathy.R RThere is a disc protrusion, more on the right at L5-S1, which may impinge on the Rexiting right L5    Patient Stated Goals  "help me with the weakness"    Currently in Pain?  Yes    Pain Score  10-Worst pain ever    Pain Location  Elbow    Pain Orientation  Right    Pain Descriptors / Indicators  Constant    Pain Type  Chronic pain    Multiple Pain Sites  Yes    Pain Score  10    Pain Location  Finger (Comment which one)   R 4th digit   Pain Orientation  Right    Pain Descriptors / Indicators  Constant    Pain Type  Chronic pain         [facility] PT Assessment - 04/16/20 1046      Assessment   Medical Diagnosis  Neck pain, chronic L shoulder pain, acute pain of R shoulder, trigger finger, chronic B LBP    Referring Provider (PT)  Eunice Blase, MD    Onset Date/Surgical Date  --   chronic conditions of several years duration    Hand Dominance  Right    Prior Therapy  no      Precautions   Precautions  None      Balance Screen   Has the patient fallen in the past 6 months  No    Has the patient had a decrease in activity level because of a fear of falling?   No    Is the patient reluctant to leave their home because of a fear of falling?    No      Home Environment   Living Environment  Private residence    Living Arrangements  Spouse/significant other    Available Help at Discharge  Family    Type of [place] Access  Level entry    [place]  Two level    Alternate Level Stairs-Number of Steps  10    Alternate Level Stairs-Rails  Left    Home Equipment  None      Prior Function   Level of Independence  Independent    Vocation  Part time employment    Vocation Requirements  med tech at [employer]   Overall Cognitive Status  Within Functional Limits for tasks assessed      Sensation   Light Touch  Appears Intact      Coordination   Gross Motor Movements are Fluid and Coordinated  Yes      Posture/Postural Control   Posture/Postural Control  Postural limitations    Postural Limitations  Rounded Shoulders;Forward head;Increased thoracic kyphosis      ROM / Strength   AROM / PROM / Strength  AROM;PROM;Strength      AROM   AROM Assessment Site  Cervical;Shoulder;Lumbar;Finger    Right/Left Shoulder  Right;Left    Right Shoulder Flexion  140 Degrees   shoulder pain   Right Shoulder ABduction  116 Degrees   shoulder pain   Right Shoulder Internal Rotation  --   FIR T9 wiht pain   Right Shoulder External Rotation  --   FER to C3 with pain   Left Shoulder Flexion  130 Degrees   back pain   Left Shoulder ABduction  167 Degrees   shoulder pain   Left Shoulder Internal Rotation  --   FIR T9 with pain   Left Shoulder External Rotation  --   FER T2 with pain   Right/Left Finger  Right    Right Composite Finger Extension  --   R 4th MCP extension 16 deg   Right Composite Finger Flexion  --   R 4th MCP flexion 50 deg   Cervical Flexion  57    Cervical Extension  27   pain   Cervical - Right Side Bend  10   pain   Cervical - Left  Side Bend  23   pain   Lumbar Flexion  ankles    Lumbar Extension  moderately limited    Lumbar - Right Side Bend  mid thigh    pain   Lumbar - Left Side Bend  mid thigh   pain   Lumbar - Right Rotation  severely limited   pain   Lumbar - Left Rotation  mildly limited   pain     Strength   Strength Assessment Site  Shoulder;Hip;Knee;Ankle    Right/Left Shoulder  Right;Left    Right Shoulder Flexion  4+/5    Right Shoulder ABduction  4/5    Right Shoulder Internal Rotation  4+/5    Right Shoulder External Rotation  4+/5    Left Shoulder Flexion  4+/5    Left Shoulder ABduction  4/5   pain over area of presssure   Left Shoulder Internal Rotation  4+/5    Left Shoulder External Rotation  4+/5    Right/Left Hip  Right;Left    Right Hip Flexion  3+/5    Right Hip ABduction  4+/5    Right Hip ADduction  4/5    Left Hip Flexion  4-/5    Left Hip ABduction  4+/5    Left Hip ADduction  4/5    Right/Left Knee  Right;Left    Right Knee Flexion  3+/5    Right Knee Extension  3+/5    Left Knee Flexion  4-/5    Left Knee Extension  4-/5    Right/Left Ankle  Right;Left    Right Ankle Dorsiflexion  3+/5    Right Ankle Plantar Flexion  3+/5    Left Ankle Dorsiflexion  4-/5    Left Ankle Plantar Flexion  4-/5      Palpation   Spinal mobility  TTP over midline of C7-T4    Palpation comment  TTP over R palmar aspect of 4th MCP; diffuse tenderness and tightness over cervical and B shoulder musculature, TTP over B greater trochanters, TTP and considerable increase in tone over B lumbar paraspinals, proximal glutes,piriformis      Ambulation/Gait   Assistive device  None    Gait Pattern  Step-through pattern;Trunk flexed;Lateral hip instability    Ambulation Surface  Level;Indoor    Gait velocity  slightly decreased                  Objective measurements completed on examination: See above findings.              PT Education - 04/16/20 1652    Education Details  prognosis, POC, HEP    Person(s) Educated  Patient    Methods  Explanation;Demonstration;Tactile cues;Verbal cues;Handout     Comprehension  Verbalized understanding;Returned demonstration       PT Short Term Goals - 04/16/20 1657      PT SHORT TERM GOAL #1   Title  Patient to be independent with initial HEP.    Time  4    Period  Weeks    Status  New    Target Date  05/14/20        PT Long Term Goals - 04/16/20 1657      PT LONG TERM GOAL #1   Title  Patient to be independent with advanced HEP.    Time  8    Period  Weeks    Status  New    Target Date  06/11/20      PT LONG TERM GOAL #  2   Title  Patient to demonstrate B LE strength >/=4/5.    Time  8    Period  Weeks    Status  New    Target Date  06/11/20      PT LONG TERM GOAL #3   Title  Patient to improve cervical AROM by 15 degrees in limited planes without pain limiting.    Time  8    Period  Weeks    Status  New    Target Date  06/11/20      PT LONG TERM GOAL #4   Title  Patient to improve demonstrate lumbar AROM WFL without pain limiting.    Time  8    Period  Weeks    Status  New    Target Date  06/11/20      PT LONG TERM GOAL #5   Title  Patient to demonstrate B shoulder AROM WFL and without pain limiting.    Time  8    Period  Weeks    Status  New    Target Date  06/11/20      Additional Long Term Goals   Additional Long Term Goals  Yes      PT LONG TERM GOAL #6   Title  Patient to demonstrate ability to make closed fist on R hand without pain limiting.    Time  8    Period  Weeks    Status  New    Target Date  06/11/20      PT LONG TERM GOAL #7   Title  Patient to report 75% improvement in pain levels.    Time  8    Period  Weeks    Status  New    Target Date  06/11/20             Plan - 04/16/20 1653    Clinical Impression Statement  Patient is a 69y/o F presenting to OPPT with c/o multiple areas of chronic pain of several years duration including cervical, B shoulder, lumbar, and R 4th digit pain. Patient endorses N/T and pain down B UEs down to fingers as well as pain radiating down B LEs  stopping at knees, but with N/T down to the toes. Denies changes in B&B control. The diffuse nature and chronicity of patient's pain may indicate central sensitization. Patient also notes that she is seeing a rheumatologist for her pain, however was unable to find recent cervical, lumbar, or shoulder imaging. Patient today presenting with rounded/kyphotic and forward head posture, limited and painful B shoulder, cervical, lumbar, and R 4th MCP ROM, good shoulder strength, marked B LE weakness R>L, TTP and increased muscle tone over musculature surrounding neck, shoulders, and back, and gait deviations. Patient educated on gentle stretching and strengthening HEP- patient reported understanding. Would benefit from skilled PT services 2x/week for 8 weeks to address aforementioned impairments.    Personal Factors and Comorbidities  Age;Sex;Comorbidity 3+;Fitness;Past/Current Experience;Profession;Time since onset of injury/illness/exacerbation    Comorbidities  HTN, HLD, DM, back surgery 2018, R carpal tunnel release 2019    Examination-Activity Limitations  Sit;Sleep;Bed Mobility;Bend;Squat;Stairs;Carry;Stand;Dressing;Transfers;Hygiene/Grooming;Lift;Locomotion Level;Reach Overhead;Self Feeding    Examination-Participation Restrictions  Church;Cleaning;Shop;Community Activity;Driving;Yard Work;Interpersonal Relationship;Laundry;Meal Prep    Stability/Clinical Decision Making  Stable/Uncomplicated    Clinical Decision Making  Low    Rehab Potential  Good    PT Frequency  2x / week    PT Duration  8 weeks    PT Treatment/Interventions  ADLs/Self Care Home Management;Cryotherapy;Lobbyist  Stimulation;Moist Heat;Iontophoresis 4mg /ml Dexamethasone;Balance training;Therapeutic exercise;Therapeutic activities;Functional mobility training;Stair training;Gait training;Ultrasound;Neuromuscular re-education;Patient/family education;Manual techniques;Vasopneumatic Device;Taping;Energy conservation;Dry needling;Passive  range of motion;Scar mobilization    PT Next Visit Plan  reassess HEP; address multiple areas of pain with preference for area of most concern; generalized strengthening of LEs and cervical/hip stretching    Consulted and Agree with Plan of Care  Patient       Patient will benefit from skilled therapeutic intervention in order to improve the following deficits and impairments:  Hypomobility, Decreased activity tolerance, Decreased strength, Pain, Impaired UE functional use, Increased fascial restricitons, Decreased balance, Difficulty walking, Increased muscle spasms, Improper body mechanics, Decreased range of motion, Impaired flexibility, Postural dysfunction  Visit Diagnosis: Cervicalgia  Acute pain of right shoulder  Chronic left shoulder pain  Chronic bilateral low back pain with bilateral sciatica  Pain in right finger(s)  Muscle weakness (generalized)  Difficulty in walking, not elsewhere classified     Problem List Patient Active Problem List   Diagnosis Date Noted  . Vitamin D deficiency 04/08/2020  . Epigastric discomfort 01/25/2019  . History of peptic ulcer disease 01/25/2019  . Diabetes mellitus without complication (HCC) 01/24/2016  . Hypertension 01/24/2016  . Hyperlipidemia 01/24/2016     03/25/2016, PT, DPT 04/16/20 5:01 PM   [facility] [address]  [address] [place], [place], [place] Phone: [phone]   Fax:  [phone]  Name: Tyleigh Mahn MRN: Morgan Ruiz Date of Birth: [birth year]

## 2020-04-23 ENCOUNTER — Ambulatory Visit: Payer: Medicare Other | Attending: Family Medicine

## 2020-04-23 ENCOUNTER — Other Ambulatory Visit: Payer: Self-pay

## 2020-04-23 DIAGNOSIS — R262 Difficulty in walking, not elsewhere classified: Secondary | ICD-10-CM | POA: Diagnosis not present

## 2020-04-23 DIAGNOSIS — G8929 Other chronic pain: Secondary | ICD-10-CM | POA: Diagnosis not present

## 2020-04-23 DIAGNOSIS — M25512 Pain in left shoulder: Secondary | ICD-10-CM | POA: Diagnosis not present

## 2020-04-23 DIAGNOSIS — M5441 Lumbago with sciatica, right side: Secondary | ICD-10-CM | POA: Insufficient documentation

## 2020-04-23 DIAGNOSIS — M542 Cervicalgia: Secondary | ICD-10-CM | POA: Diagnosis not present

## 2020-04-23 DIAGNOSIS — M25511 Pain in right shoulder: Secondary | ICD-10-CM | POA: Diagnosis not present

## 2020-04-23 DIAGNOSIS — M5442 Lumbago with sciatica, left side: Secondary | ICD-10-CM | POA: Insufficient documentation

## 2020-04-23 DIAGNOSIS — M6281 Muscle weakness (generalized): Secondary | ICD-10-CM | POA: Diagnosis not present

## 2020-04-23 DIAGNOSIS — M79644 Pain in right finger(s): Secondary | ICD-10-CM | POA: Diagnosis not present

## 2020-04-23 NOTE — Therapy (Signed)
[facility] [address]  [address] [place], [place], [address] Phone: [phone]   Fax:  [phone]  Physical Therapy Treatment  Patient Details  Name: Morgan Ruiz MRN: [id number] Date of Birth: 07-12-1951 Referring Provider (PT): Lavada Mesi, MD   Encounter Date: 04/23/2020  PT End of Session - 04/23/20 1413    Visit Number  2    Number of Visits  17    Date for PT Re-Evaluation  06/11/20    Authorization Type  [employer]HC Medicare    PT Start Time  1404    PT Stop Time  1445    PT Time Calculation (min)  41 min    Activity Tolerance  Patient tolerated treatment well;Patient limited by pain    Behavior During Therapy  [facility] for tasks assessed/performed       Past Medical History:  Diagnosis Date  . Diabetes mellitus without complication (HCC)   . Hyperlipidemia   . Hypertension     No past surgical history on file.  There were no vitals filed for this visit.  Subjective Assessment - 04/23/20 1407    Subjective  Pt. noting she tried the exercises without much problem.    Pertinent History  HTN, HLD, DM, back surgery 2018, R carpal tunnel release 2019    Diagnostic tests  lumbar MRI 11/15/16: There is multilevel degenerative disc disease and facet joint arthropathy.R RThere is a disc protrusion, more on the right at L5-S1, which may impinge on the Rexiting right L5    Patient Stated Goals  "help me with the weakness"    Currently in Pain?  Yes    Pain Score  10-Worst pain ever    Pain Location  Elbow    Pain Orientation  Right    Pain Descriptors / Indicators  Constant    Pain Type  Chronic pain    Pain Radiating Towards  pain into R forearm into R hand/palm                        [facility] Adult PT Treatment/Exercise - 04/23/20 0001      Neck Exercises: Machines for Strengthening   UBE (Upper Arm Bike)  Lvl 1.0, 3 min forwards/3 backwards       Neck Exercises: Seated   Other Seated Exercise  B  scapular retraction 5" x 10       Knee/Hip Exercises: Standing   Heel Raises  Both;15 reps    Heel Raises Limitations  counter support        Knee/Hip Exercises: Seated   Long Arc Quad  Right;Left;10 reps;Strengthening    Long Arc Quad Limitations  B with yellow TB attached on back of chair     Hamstring Curl  Right;Left;10 reps    Hamstring Limitations  yellow TB       Neck Exercises: Stretches   Upper Trapezius Stretch  Right;Left;2 reps;30 seconds    Upper Trapezius Stretch Limitations  B    Levator Stretch  Right;Left;2 reps;30 seconds    Levator Stretch Limitations  B                PT Short Term Goals - 04/23/20 1440      PT SHORT TERM GOAL #1   Title  Patient to be independent with initial HEP.    Time  4    Period  Weeks    Status  On-going    Target Date  05/14/20        PT Long Term Goals - 04/23/20 1440      PT LONG TERM GOAL #1   Title  Patient to be independent with advanced HEP.    Time  8    Period  Weeks    Status  On-going      PT LONG TERM GOAL #2   Title  Patient to demonstrate B LE strength >/=4/5.    Time  8    Period  Weeks    Status  On-going      PT LONG TERM GOAL #3   Title  Patient to improve cervical AROM by 15 degrees in limited planes without pain limiting.    Time  8    Period  Weeks    Status  On-going      PT LONG TERM GOAL #4   Title  Patient to improve demonstrate lumbar AROM WFL without pain limiting.    Time  8    Period  Weeks    Status  On-going      PT LONG TERM GOAL #5   Title  Patient to demonstrate B shoulder AROM WFL and without pain limiting.    Time  8    Period  Weeks    Status  On-going      PT LONG TERM GOAL #6   Title  Patient to demonstrate ability to make closed fist on R hand without pain limiting.    Time  8    Period  Weeks    Status  On-going      PT LONG TERM GOAL #7   Title  Patient to report 75% improvement in pain levels.    Time  8    Period  Weeks    Status  On-going             Plan - 04/23/20 1440    Clinical Impression Statement  Pt. reporting no issues with HEP.  Only required minimal guidance for positioning with yellow TB for seated LAQ exercise however able to demo good understanding with all other HEP activities.  Primary complaint today was R lateral forearm pain which remained constant throughout session.  Pt. did report multiple locations of tenderness along R UE and shoulders/neck in session which did not limit scapular/LE strengthening activities.    Comorbidities  HTN, HLD, DM, back surgery 2018, R carpal tunnel release 2019    Rehab Potential  Good    PT Frequency  2x / week    PT Treatment/Interventions  ADLs/Self Care Home Management;Cryotherapy;Electrical Stimulation;Moist Heat;Iontophoresis 4mg /ml Dexamethasone;Balance training;Therapeutic exercise;Therapeutic activities;Functional mobility training;Stair training;Gait training;Ultrasound;Neuromuscular re-education;Patient/family education;Manual techniques;Vasopneumatic Device;Taping;Energy conservation;Dry needling;Passive range of motion;Scar mobilization    PT Next Visit Plan  Address multiple areas of pain with preference for area of most concern; generalized strengthening of LEs and cervical/hip stretching    Consulted and Agree with Plan of Care  Patient       Patient will benefit from skilled therapeutic intervention in order to improve the following deficits and impairments:  Hypomobility, Decreased activity tolerance, Decreased strength, Pain, Impaired UE functional use, Increased fascial restricitons, Decreased balance, Difficulty walking, Increased muscle spasms, Improper body mechanics, Decreased range of motion, Impaired flexibility, Postural dysfunction  Visit Diagnosis: Cervicalgia  Acute pain of right shoulder  Chronic left shoulder pain  Chronic bilateral low back pain with bilateral sciatica  Pain in right finger(s)  Muscle weakness (generalized)  Difficulty in  walking, not elsewhere classified  Problem List Patient Active Problem List   Diagnosis Date Noted  . Vitamin D deficiency 04/08/2020  . Epigastric discomfort 01/25/2019  . History of peptic ulcer disease 01/25/2019  . Diabetes mellitus without complication (HCC) 01/24/2016  . Hypertension 01/24/2016  . Hyperlipidemia 01/24/2016    Kermit Balo, PTA 04/23/20 9:03 PM   [facility] [address]  [address] [place], [place], [address] Phone: [phone]   Fax:  [phone]  Name: Morgan Ruiz MRN: [id number] Date of Birth: 1951/11/10

## 2020-04-28 ENCOUNTER — Ambulatory Visit: Payer: Medicare Other

## 2020-04-30 ENCOUNTER — Other Ambulatory Visit: Payer: Self-pay

## 2020-04-30 ENCOUNTER — Ambulatory Visit: Payer: Medicare Other

## 2020-04-30 DIAGNOSIS — G8929 Other chronic pain: Secondary | ICD-10-CM | POA: Diagnosis not present

## 2020-04-30 DIAGNOSIS — M25512 Pain in left shoulder: Secondary | ICD-10-CM | POA: Diagnosis not present

## 2020-04-30 DIAGNOSIS — M79644 Pain in right finger(s): Secondary | ICD-10-CM | POA: Diagnosis not present

## 2020-04-30 DIAGNOSIS — M5442 Lumbago with sciatica, left side: Secondary | ICD-10-CM

## 2020-04-30 DIAGNOSIS — M542 Cervicalgia: Secondary | ICD-10-CM

## 2020-04-30 DIAGNOSIS — M5441 Lumbago with sciatica, right side: Secondary | ICD-10-CM | POA: Diagnosis not present

## 2020-04-30 DIAGNOSIS — M6281 Muscle weakness (generalized): Secondary | ICD-10-CM | POA: Diagnosis not present

## 2020-04-30 DIAGNOSIS — M25511 Pain in right shoulder: Secondary | ICD-10-CM

## 2020-04-30 DIAGNOSIS — R262 Difficulty in walking, not elsewhere classified: Secondary | ICD-10-CM | POA: Diagnosis not present

## 2020-04-30 NOTE — Therapy (Signed)
[facility] [address]  [place], [place], [address] Phone: [phone]   Fax:  [phone]  Physical Therapy Treatment  Patient Details  Name: Morgan Ruiz MRN: [id number] Date of Birth: [birth year] Referring Provider (PT): Eunice Blase, MD   Encounter Date: 04/30/2020   PT End of Session - 04/30/20 0808    Visit Number 3    Number of Visits 17    Date for PT Re-Evaluation 06/11/20    Authorization Type UHC Medicare    PT Start Time 0808   pt arrived late   PT Stop Time 0844    PT Time Calculation (min) 36 min    Activity Tolerance Patient tolerated treatment well;Patient limited by pain    Behavior During Therapy [facility] for tasks assessed/performed           Past Medical History:  Diagnosis Date  . Diabetes mellitus without complication ([place])   . Hyperlipidemia   . Hypertension     No past surgical history on file.  There were no vitals filed for this visit.   Subjective Assessment - 04/30/20 0809    Subjective Pt reports a lot of pain in her right hand at 4th digit PIP and lateral foream. She says her hips are something she wants to address with her doctor again on the 16th when she is there.    Pertinent History HTN, HLD, DM, back surgery 2018, R carpal tunnel release 2019    Diagnostic tests lumbar MRI 11/15/16: There is multilevel degenerative disc disease and facet joint arthropathy.R RThere is a disc protrusion, more on the right at L5-S1, which may impinge on the Rexiting right L5    Patient Stated Goals "help me with the weakness"    Currently in Pain? Yes    Pain Score 10-Worst pain ever    Pain Location Elbow    Pain Orientation Right    Pain Descriptors / Indicators Constant    Pain Type Chronic pain    Pain Radiating Towards ain radiates above and below the elbow laterally    Multiple Pain Sites Yes    Pain Score 10    Pain Location Finger (Comment which one)   R 4th digit   Pain  Orientation Right    Pain Descriptors / Indicators Constant    Pain Type Chronic pain                             [facility] Adult PT Treatment/Exercise - 04/30/20 0001      Exercises   Exercises Elbow;Knee/Hip      Neck Exercises: Machines for Strengthening   Nustep Lvl 4.0, 6 minutes BUE/BLE      Neck Exercises: Seated   Lateral Flexion Limitations Supine horizontal ABD with YTB, R hand in loop to prevent painful grip 1 x 15    Other Seated Exercise B scapular retraction 5" x 10 at corner with YTB    Other Seated Exercise PT assisted and passive wrist extensor stretch 2 x 30"      Knee/Hip Exercises: Supine   Bridges AROM;Strengthening;Both;10 reps    Bridges Limitations Low bridge with pelvic tilt, RTB around knees for isometric hip ABD                    PT Short Term Goals - 04/23/20 1440      PT SHORT TERM GOAL #1   Title Patient  to be independent with initial HEP.    Time 4    Period Weeks    Status On-going    Target Date 05/14/20             PT Long Term Goals - 04/23/20 1440      PT LONG TERM GOAL #1   Title Patient to be independent with advanced HEP.    Time 8    Period Weeks    Status On-going      PT LONG TERM GOAL #2   Title Patient to demonstrate B LE strength >/=4/5.    Time 8    Period Weeks    Status On-going      PT LONG TERM GOAL #3   Title Patient to improve cervical AROM by 15 degrees in limited planes without pain limiting.    Time 8    Period Weeks    Status On-going      PT LONG TERM GOAL #4   Title Patient to improve demonstrate lumbar AROM WFL without pain limiting.    Time 8    Period Weeks    Status On-going      PT LONG TERM GOAL #5   Title Patient to demonstrate B shoulder AROM WFL and without pain limiting.    Time 8    Period Weeks    Status On-going      PT LONG TERM GOAL #6   Title Patient to demonstrate ability to make closed fist on R hand without pain limiting.    Time 8    Period  Weeks    Status On-going      PT LONG TERM GOAL #7   Title Patient to report 75% improvement in pain levels.    Time 8    Period Weeks    Status On-going                 Plan - 04/30/20 9509    Clinical Impression Statement Pt presents with continued high pain levels and hypersensitivity. Pt tolerated tx well with main c/o R 4th digit PIP pain, as well as lateral elbow pain. Pain subsided in both hand and elbow with minimizing any gripping of theraband and rather using dorsum of hand in loop. Pt had (+) Maudsley's test with pain in lateral elbow over origin of wrist extensors. She responded well to PT assisted and patient passive stretching and was advised to continue at home. Pt likely has R elbow lateral epicondylalgia, potentially exacerbated by 4th digit pain and altered gripping mechanics.    Comorbidities HTN, HLD, DM, back surgery 2018, R carpal tunnel release 2019    Rehab Potential Good    PT Frequency 2x / week    PT Treatment/Interventions ADLs/Self Care Home Management;Cryotherapy;Electrical Stimulation;Moist Heat;Iontophoresis 4mg /ml Dexamethasone;Balance training;Therapeutic exercise;Therapeutic activities;Functional mobility training;Stair training;Gait training;Ultrasound;Neuromuscular re-education;Patient/family education;Manual techniques;Vasopneumatic Device;Taping;Energy conservation;Dry needling;Passive range of motion;Scar mobilization    PT Next Visit Plan Address multiple areas of pain with preference for area of most concern; generalized strengthening of LEs and cervical/hip stretching    Consulted and Agree with Plan of Care Patient           Patient will benefit from skilled therapeutic intervention in order to improve the following deficits and impairments:  Hypomobility, Decreased activity tolerance, Decreased strength, Pain, Impaired UE functional use, Increased fascial restricitons, Decreased balance, Difficulty walking, Increased muscle spasms, Improper  body mechanics, Decreased range of motion, Impaired flexibility, Postural dysfunction  Visit Diagnosis: Cervicalgia  Pain in  right finger(s)  Acute pain of right shoulder  Muscle weakness (generalized)  Chronic left shoulder pain  Difficulty in walking, not elsewhere classified  Chronic bilateral low back pain with bilateral sciatica     Problem List Patient Active Problem List   Diagnosis Date Noted  . Vitamin D deficiency 04/08/2020  . Epigastric discomfort 01/25/2019  . History of peptic ulcer disease 01/25/2019  . Diabetes mellitus without complication (HCC) 01/24/2016  . Hypertension 01/24/2016  . Hyperlipidemia 01/24/2016    Marcelline Mates, PT, DPT 04/30/2020, 12:11 PM  [facility] [address]  [address] [place], [place], [address] Phone: [phone]   Fax:  [phone]  Name: Kiowa Peifer MRN: [id number] Date of Birth: August 04, 1951

## 2020-05-04 ENCOUNTER — Other Ambulatory Visit: Payer: Self-pay

## 2020-05-04 ENCOUNTER — Ambulatory Visit: Payer: Medicare Other

## 2020-05-04 DIAGNOSIS — M25512 Pain in left shoulder: Secondary | ICD-10-CM | POA: Diagnosis not present

## 2020-05-04 DIAGNOSIS — G8929 Other chronic pain: Secondary | ICD-10-CM

## 2020-05-04 DIAGNOSIS — M79644 Pain in right finger(s): Secondary | ICD-10-CM | POA: Diagnosis not present

## 2020-05-04 DIAGNOSIS — M25511 Pain in right shoulder: Secondary | ICD-10-CM

## 2020-05-04 DIAGNOSIS — M542 Cervicalgia: Secondary | ICD-10-CM

## 2020-05-04 DIAGNOSIS — M5442 Lumbago with sciatica, left side: Secondary | ICD-10-CM | POA: Diagnosis not present

## 2020-05-04 DIAGNOSIS — M5441 Lumbago with sciatica, right side: Secondary | ICD-10-CM | POA: Diagnosis not present

## 2020-05-04 DIAGNOSIS — M6281 Muscle weakness (generalized): Secondary | ICD-10-CM | POA: Diagnosis not present

## 2020-05-04 DIAGNOSIS — R262 Difficulty in walking, not elsewhere classified: Secondary | ICD-10-CM

## 2020-05-04 NOTE — Therapy (Signed)
[facility] [address]  [address] [place], [place], [address] Phone: [phone]   Fax:  [phone]  Physical Therapy Treatment  Patient Details  Name: Morgan Ruiz MRN: [id number] Date of Birth: 11/17/1951 Referring Provider (PT): Lavada Mesi, MD   Encounter Date: 05/04/2020   PT End of Session - 05/04/20 1112    Visit Number 4    Number of Visits 17    Date for PT Re-Evaluation 06/11/20    Authorization Type UHC Medicare    PT Start Time 1112   pt arrived late   PT Stop Time 1145   Pt requested to leave at this time   PT Time Calculation (min) 33 min    Activity Tolerance Patient tolerated treatment well;Patient limited by pain    Behavior During Therapy [facility] for tasks assessed/performed           Past Medical History:  Diagnosis Date  . Diabetes mellitus without complication (HCC)   . Hyperlipidemia   . Hypertension     No past surgical history on file.  There were no vitals filed for this visit.   Subjective Assessment - 05/04/20 1116    Subjective Pt reports she had a lot of hip pain for a couple of days over weekend. Unclear as to what caused this - also had a lot of posterior neck pain.    Pertinent History HTN, HLD, DM, back surgery 2018, R carpal tunnel release 2019    Diagnostic tests lumbar MRI 11/15/16: There is multilevel degenerative disc disease and facet joint arthropathy.R RThere is a disc protrusion, more on the right at L5-S1, which may impinge on the Rexiting right L5    Patient Stated Goals "help me with the weakness"    Currently in Pain? Yes    Pain Score 7     Pain Location Hand   4th digit   Pain Orientation Right    Pain Descriptors / Indicators Constant                             [facility] Adult PT Treatment/Exercise - 05/04/20 0001      Exercises   Exercises Shoulder;Lumbar      Neck Exercises: Machines for Strengthening   Nustep Lvl 4.0, 7 min (BUE/LE)    seat #7, arm #7)     Lumbar Exercises: Stretches   Lower Trunk Rotation Limitations x10 B + manual assist    Figure 4 Stretch 2 reps;30 seconds    Figure 4 Stretch Limitations manual assist with KTOS as well on L hip (pain in L hip with KTOS on R)      Shoulder Exercises: Supine   Horizontal ABduction AROM;Strengthening;Both;12 reps;Theraband    Theraband Level (Shoulder Horizontal ABduction) Level 1 (Yellow)    External Rotation AROM;Strengthening;Both;12 reps;Theraband    Theraband Level (Shoulder External Rotation) Level 1 (Yellow)      Shoulder Exercises: Seated   Theraband Level (Shoulder Diagonals) Level 1 (Yellow)    Diagonals Limitations 12 reps LUE      Shoulder Exercises: Standing   Retraction AROM;Strengthening;Both;15 reps;Theraband    Theraband Level (Shoulder Retraction) Level 1 (Yellow)    Retraction Limitations HBH 3" hold, manual cues for R shoulder depression    Diagonals AROM;Strengthening;Left;12 reps    Diagonals Limitations D2 RUE                    PT Short  Term Goals - 04/23/20 1440      PT SHORT TERM GOAL #1   Title Patient to be independent with initial HEP.    Time 4    Period Weeks    Status On-going    Target Date 05/14/20             PT Long Term Goals - 04/23/20 1440      PT LONG TERM GOAL #1   Title Patient to be independent with advanced HEP.    Time 8    Period Weeks    Status On-going      PT LONG TERM GOAL #2   Title Patient to demonstrate B LE strength >/=4/5.    Time 8    Period Weeks    Status On-going      PT LONG TERM GOAL #3   Title Patient to improve cervical AROM by 15 degrees in limited planes without pain limiting.    Time 8    Period Weeks    Status On-going      PT LONG TERM GOAL #4   Title Patient to improve demonstrate lumbar AROM WFL without pain limiting.    Time 8    Period Weeks    Status On-going      PT LONG TERM GOAL #5   Title Patient to demonstrate B shoulder AROM WFL and without  pain limiting.    Time 8    Period Weeks    Status On-going      PT LONG TERM GOAL #6   Title Patient to demonstrate ability to make closed fist on R hand without pain limiting.    Time 8    Period Weeks    Status On-going      PT LONG TERM GOAL #7   Title Patient to report 75% improvement in pain levels.    Time 8    Period Weeks    Status On-going                 Plan - 05/04/20 1115    Clinical Impression Statement Pt tolerated session well with on adverse reactions. Performed R shoulder D2 PNF in sitting to focus on shoulder depression without compensation in trunk. Pt able to perform LUE in standing, as well as shoulder retraction with HBH holding YTB. Manually assisted pt with hip and low back stretching 2 c/o pain in hips over weekend that exercising was not alleviating. Pt does have soft tissue restriction and muscle tightness limiting further stretch into figure 4.    Comorbidities HTN, HLD, DM, back surgery 2018, R carpal tunnel release 2019    Rehab Potential Good    PT Frequency 2x / week    PT Treatment/Interventions ADLs/Self Care Home Management;Cryotherapy;Electrical Stimulation;Moist Heat;Iontophoresis 4mg /ml Dexamethasone;Balance training;Therapeutic exercise;Therapeutic activities;Functional mobility training;Stair training;Gait training;Ultrasound;Neuromuscular re-education;Patient/family education;Manual techniques;Vasopneumatic Device;Taping;Energy conservation;Dry needling;Passive range of motion;Scar mobilization    PT Next Visit Plan Address multiple areas of pain with preference for area of most concern; generalized strengthening of LEs and cervical/hip stretching    Consulted and Agree with Plan of Care Patient           Patient will benefit from skilled therapeutic intervention in order to improve the following deficits and impairments:  Hypomobility, Decreased activity tolerance, Decreased strength, Pain, Impaired UE functional use, Increased  fascial restricitons, Decreased balance, Difficulty walking, Increased muscle spasms, Improper body mechanics, Decreased range of motion, Impaired flexibility, Postural dysfunction  Visit Diagnosis: Cervicalgia  Pain in right finger(s)  Acute pain of right shoulder  Muscle weakness (generalized)  Chronic left shoulder pain  Difficulty in walking, not elsewhere classified  Chronic bilateral low back pain with bilateral sciatica     Problem List Patient Active Problem List   Diagnosis Date Noted  . Vitamin D deficiency 04/08/2020  . Epigastric discomfort 01/25/2019  . History of peptic ulcer disease 01/25/2019  . Diabetes mellitus without complication ([place]) 60/08/9322  . Hypertension 01/24/2016  . Hyperlipidemia 01/24/2016    Izell , PT, DPT 05/04/2020, 12:11 PM  [facility] [address]  [place], [place], [address] Phone: [phone]   Fax:  [phone]  Name: Morgan Ruiz MRN: [id number] Date of Birth: October 03, 1951

## 2020-05-06 ENCOUNTER — Ambulatory Visit (INDEPENDENT_AMBULATORY_CARE_PROVIDER_SITE_OTHER): Payer: Medicare Other | Admitting: Family Medicine

## 2020-05-06 ENCOUNTER — Encounter: Payer: Self-pay | Admitting: Family Medicine

## 2020-05-06 ENCOUNTER — Other Ambulatory Visit: Payer: Self-pay

## 2020-05-06 VITALS — BP 130/78 | HR 84

## 2020-05-06 DIAGNOSIS — Z8249 Family history of ischemic heart disease and other diseases of the circulatory system: Secondary | ICD-10-CM

## 2020-05-06 DIAGNOSIS — I10 Essential (primary) hypertension: Secondary | ICD-10-CM

## 2020-05-06 DIAGNOSIS — M25561 Pain in right knee: Secondary | ICD-10-CM | POA: Diagnosis not present

## 2020-05-06 DIAGNOSIS — R0609 Other forms of dyspnea: Secondary | ICD-10-CM

## 2020-05-06 DIAGNOSIS — R0600 Dyspnea, unspecified: Secondary | ICD-10-CM

## 2020-05-06 MED ORDER — OMEPRAZOLE 20 MG PO CPDR: 20.0000 mg | DELAYED_RELEASE_CAPSULE | Freq: Every day | ORAL | 3 refills | Status: DC

## 2020-05-06 MED ORDER — GABAPENTIN 100 MG PO CAPS: ORAL_CAPSULE | ORAL | 3 refills | Status: DC

## 2020-05-06 MED ORDER — DICLOFENAC SODIUM 1 % EX GEL: 4.0000 g | Freq: Four times a day (QID) | CUTANEOUS | 6 refills | Status: DC | PRN

## 2020-05-06 NOTE — Progress Notes (Signed)
Office Visit Note   Patient: Morgan Ruiz           Date of Birth: [birth year]           MRN: [id number] Visit Date: 05/06/2020 Requested by: Eunice Blase, MD [address] [place],  [place] [address] PCP: Eunice Blase, MD  Subjective: No chief complaint on file.   HPI: She is here for follow-up hypertension. Doing very well with the addition of hydrochlorothiazide. No side effects, and her blood pressure readings at home have been in the normal range. She is also taking olmesartan.  She does note that she was born with some sort of heart condition. Her twin brother had it as well, and he died at 67 year of age. Years ago she saw a cardiologist and had some stress testing done. Lately she has been having some fatigue with exertion. No actual chest pain. She is wondering whether she should see a cardiologist again.  She occasionally has pain in her right knee, mostly on the medial aspect when going up and down steps. No locking, no giving way. She has not taken anything for it.  She needs refill of omeprazole for GERD. This works well for her.               ROS:   All other systems were reviewed and are negative.  Objective: Vital Signs: BP 130/78   Pulse 84   Physical Exam:  General:  Alert and oriented, in no acute distress. Pulm:  Breathing unlabored. Psy:  Normal mood, congruent affect.  CV: Regular rate and rhythm without murmurs, rubs, or gallops.  No peripheral edema.  2+ radial and posterior tibial pulses. Lungs: Clear to auscultation throughout with no wheezing or areas of consolidation. Right knee: No effusion, 1+ patellofemoral crepitus. Mild tenderness around the patellofemoral joint, maximum tenderness over the medial joint line. No palpable click with McMurray's.   Imaging: No results found.  Assessment & Plan: 1. Hypertension, under good control. -Follow-up in 4 to 6 months for recheck.  2. Dyspnea on exertion with family history of heart disease in her  mother and personal history of some sort of heart disorder -Cardiology referral for evaluation.  3. Right knee pain, possible degenerative meniscus tear -Trial of Voltaren gel. Consider a one-time injection if symptoms persist.  4. GERD, well controlled -Refilled omeprazole.     Procedures: No procedures performed  No notes on file     PMFS History: Patient Active Problem List   Diagnosis Date Noted  . Vitamin D deficiency 04/08/2020  . Epigastric discomfort 01/25/2019  . History of peptic ulcer disease 01/25/2019  . Diabetes mellitus without complication ([place]) 50/53/9767  . Hypertension 01/24/2016  . Hyperlipidemia 01/24/2016   Past Medical History:  Diagnosis Date  . Diabetes mellitus without complication ([place])   . Hyperlipidemia   . Hypertension     Family History  Problem Relation Age of Onset  . Diabetes type II Mother   . Heart attack Mother   . Liver cancer Father   . Heart attack Brother     No past surgical history on file. Social History   Occupational History  . Occupation: Emergency planning/management officer  Tobacco Use  . Smoking status: Former Smoker    Quit date: 11/21/2012    Years since quitting: 7.4  . Smokeless tobacco: Never Used  . Tobacco comment: social smoker  Vaping Use  . Vaping Use: Never used  Substance and Sexual Activity  . Alcohol use: No  Alcohol/week: 0.0 standard drinks  . Drug use: No  . Sexual activity: Yes

## 2020-05-07 ENCOUNTER — Ambulatory Visit: Payer: Medicare Other

## 2020-05-07 DIAGNOSIS — M25511 Pain in right shoulder: Secondary | ICD-10-CM

## 2020-05-07 DIAGNOSIS — G8929 Other chronic pain: Secondary | ICD-10-CM

## 2020-05-07 DIAGNOSIS — M6281 Muscle weakness (generalized): Secondary | ICD-10-CM

## 2020-05-07 DIAGNOSIS — M5442 Lumbago with sciatica, left side: Secondary | ICD-10-CM | POA: Diagnosis not present

## 2020-05-07 DIAGNOSIS — M25512 Pain in left shoulder: Secondary | ICD-10-CM

## 2020-05-07 DIAGNOSIS — M5441 Lumbago with sciatica, right side: Secondary | ICD-10-CM | POA: Diagnosis not present

## 2020-05-07 DIAGNOSIS — M542 Cervicalgia: Secondary | ICD-10-CM | POA: Diagnosis not present

## 2020-05-07 DIAGNOSIS — M79644 Pain in right finger(s): Secondary | ICD-10-CM

## 2020-05-07 DIAGNOSIS — R262 Difficulty in walking, not elsewhere classified: Secondary | ICD-10-CM | POA: Diagnosis not present

## 2020-05-07 NOTE — Therapy (Signed)
[facility] Outpatient [facility] [address]  [address] [place], [place], [address] Phone: [phone]   Fax:  [phone]  Physical Therapy Treatment  Patient Details  Name: Morgan Ruiz MRN: [id number] Date of Birth: Oct 21, 1951 Referring Provider (PT): Lavada Mesi, MD   Encounter Date: 05/07/2020   PT End of Session - 05/07/20 1152    Visit Number 5    Number of Visits 17    Date for PT Re-Evaluation 06/11/20    Authorization Type UHC Medicare    PT Start Time 1151    PT Stop Time 1232    PT Time Calculation (min) 41 min    Activity Tolerance Patient tolerated treatment well;Patient limited by pain    Behavior During Therapy [facility] for tasks assessed/performed           Past Medical History:  Diagnosis Date  . Diabetes mellitus without complication (H[facility])   . Hyperlipidemia   . Hypertension     No past surgical history on file.  There were no vitals filed for this visit.   Subjective Assessment - 05/07/20 1153    Subjective Pt reports she saw her PCP yesterday who she was rolling her R ankle all the time. He gave her Voltaren for supreficial application to R ankle and L foot.    Pertinent History HTN, HLD, DM, back surgery 2018, R carpal tunnel release 2019    Diagnostic tests lumbar MRI 11/15/16: There is multilevel degenerative disc disease and facet joint arthropathy.R RThere is a disc protrusion, more on the right at L5-S1, which may impinge on the Rexiting right L5    Patient Stated Goals "help me with the weakness"    Currently in Pain? No/denies    Pain Score --   neck bothers her when she is bending for awhile or looking up   Pain Location Neck                             [facility] Adult PT Treatment/Exercise - 05/07/20 0001      Lumbar Exercises: Supine   Pelvic Tilt 15 reps    Pelvic Tilt Limitations on exhale      Knee/Hip Exercises: Standing   Functional Squat 2 sets;10 seconds    Functional  Squat Limitations RTB above knees, eccentric lower to mat table      Knee/Hip Exercises: Supine   Bridges Strengthening;Both;1 set;15 reps    Bridges Limitations RTB around knees    Other Supine Knee/Hip Exercises H/L Clams 15x5" RTB, uni x 10 B      Knee/Hip Exercises: Sidelying   Clams x15 B RTB                    PT Short Term Goals - 04/23/20 1440      PT SHORT TERM GOAL #1   Title Patient to be independent with initial HEP.    Time 4    Period Weeks    Status On-going    Target Date 05/14/20             PT Long Term Goals - 04/23/20 1440      PT LONG TERM GOAL #1   Title Patient to be independent with advanced HEP.    Time 8    Period Weeks    Status On-going      PT LONG TERM GOAL #2   Title Patient to demonstrate B LE strength >/=  4/5.    Time 8    Period Weeks    Status On-going      PT LONG TERM GOAL #3   Title Patient to improve cervical AROM by 15 degrees in limited planes without pain limiting.    Time 8    Period Weeks    Status On-going      PT LONG TERM GOAL #4   Title Patient to improve demonstrate lumbar AROM WFL without pain limiting.    Time 8    Period Weeks    Status On-going      PT LONG TERM GOAL #5   Title Patient to demonstrate B shoulder AROM WFL and without pain limiting.    Time 8    Period Weeks    Status On-going      PT LONG TERM GOAL #6   Title Patient to demonstrate ability to make closed fist on R hand without pain limiting.    Time 8    Period Weeks    Status On-going      PT LONG TERM GOAL #7   Title Patient to report 75% improvement in pain levels.    Time 8    Period Weeks    Status On-going                 Plan - 05/07/20 1152    Clinical Impression Statement Pt presents with no c/o pain today currently, but reports multiple areas that bother on a daily basis. She feels that hip strengthening is helping her R leg pain now and wanted to work on more leg strength today. Pt tolerated all tx  well with good response to verbal, visual, and tactile cueing. Declined pictures to add to HEP, stating she remembers. Pt advised to alternate lower/upper body days or base it on her pain she is feeling.    Comorbidities HTN, HLD, DM, back surgery 2018, R carpal tunnel release 2019    Rehab Potential Good    PT Frequency 2x / week    PT Treatment/Interventions ADLs/Self Care Home Management;Cryotherapy;Electrical Stimulation;Moist Heat;Iontophoresis 4mg /ml Dexamethasone;Balance training;Therapeutic exercise;Therapeutic activities;Functional mobility training;Stair training;Gait training;Ultrasound;Neuromuscular re-education;Patient/family education;Manual techniques;Vasopneumatic Device;Taping;Energy conservation;Dry needling;Passive range of motion;Scar mobilization    PT Next Visit Plan Address multiple areas of pain with preference for area of most concern; generalized strengthening of LEs and cervical/hip stretching    Consulted and Agree with Plan of Care Patient           Patient will benefit from skilled therapeutic intervention in order to improve the following deficits and impairments:  Hypomobility, Decreased activity tolerance, Decreased strength, Pain, Impaired UE functional use, Increased fascial restricitons, Decreased balance, Difficulty walking, Increased muscle spasms, Improper body mechanics, Decreased range of motion, Impaired flexibility, Postural dysfunction  Visit Diagnosis: Cervicalgia  Pain in right finger(s)  Acute pain of right shoulder  Muscle weakness (generalized)  Chronic left shoulder pain  Difficulty in walking, not elsewhere classified  Chronic bilateral low back pain with bilateral sciatica     Problem List Patient Active Problem List   Diagnosis Date Noted  . Vitamin D deficiency 04/08/2020  . Epigastric discomfort 01/25/2019  . History of peptic ulcer disease 01/25/2019  . Diabetes mellitus without complication (HCC) 01/24/2016  .  Hypertension 01/24/2016  . Hyperlipidemia 01/24/2016    03/25/2016, PT, DPT 05/07/2020, 12:56 PM  [facility] [address]  [address] [place], [place], [place] Phone: [phone]   Fax:  [phone]  Name: Morgan Ruiz MRN: [id number] Date of Birth: [birth year]

## 2020-05-11 ENCOUNTER — Other Ambulatory Visit: Payer: Self-pay

## 2020-05-11 ENCOUNTER — Ambulatory Visit: Payer: Medicare Other

## 2020-05-11 DIAGNOSIS — G8929 Other chronic pain: Secondary | ICD-10-CM | POA: Diagnosis not present

## 2020-05-11 DIAGNOSIS — M79644 Pain in right finger(s): Secondary | ICD-10-CM | POA: Diagnosis not present

## 2020-05-11 DIAGNOSIS — M25511 Pain in right shoulder: Secondary | ICD-10-CM

## 2020-05-11 DIAGNOSIS — M5442 Lumbago with sciatica, left side: Secondary | ICD-10-CM | POA: Diagnosis not present

## 2020-05-11 DIAGNOSIS — M5441 Lumbago with sciatica, right side: Secondary | ICD-10-CM

## 2020-05-11 DIAGNOSIS — M6281 Muscle weakness (generalized): Secondary | ICD-10-CM

## 2020-05-11 DIAGNOSIS — R262 Difficulty in walking, not elsewhere classified: Secondary | ICD-10-CM

## 2020-05-11 DIAGNOSIS — M542 Cervicalgia: Secondary | ICD-10-CM | POA: Diagnosis not present

## 2020-05-11 DIAGNOSIS — M25512 Pain in left shoulder: Secondary | ICD-10-CM

## 2020-05-11 NOTE — Therapy (Signed)
[facility] [place] [address]  [place], [place], [address] Phone: [phone]   Fax:  [phone]  Physical Therapy Treatment  Patient Details  Name: Morgan Ruiz MRN: [id number] Date of Birth: [birth year] Referring Provider (PT): Eunice Blase, MD   Encounter Date: 05/11/2020   PT End of Session - 05/11/20 1116    Visit Number 6    Number of Visits 17    Date for PT Re-Evaluation 06/11/20    Authorization Type UHC Medicare    PT Start Time 1113   pt arrived late   PT Stop Time 1151    PT Time Calculation (min) 38 min    Activity Tolerance Patient tolerated treatment well    Behavior During Therapy [facility] for tasks assessed/performed           Past Medical History:  Diagnosis Date  . Diabetes mellitus without complication ([place])   . Hyperlipidemia   . Hypertension     No past surgical history on file.  There were no vitals filed for this visit.   Subjective Assessment - 05/11/20 1115    Subjective Pt reports she is very tired from taking care of her cat, going to animal hospital. She has been having R medial knee pain when descending stairs. Her MD is likely ordering MRI post therapy for knee, as well as referral for cardiologist. She work up a few days ago with large bruise of R medial forearm with unknown cause, but reports it's not the first time.    Pertinent History HTN, HLD, DM, back surgery 2018, R carpal tunnel release 2019    Diagnostic tests lumbar MRI 11/15/16: There is multilevel degenerative disc disease and facet joint arthropathy.R RThere is a disc protrusion, more on the right at L5-S1, which may impinge on the Rexiting right L5    Patient Stated Goals "help me with the weakness"    Currently in Pain? No/denies                             [facility] Adult PT Treatment/Exercise - 05/11/20 0001      Exercises   Exercises Knee/Hip      Neck Exercises: Machines for Strengthening    Nustep Lvl 4.0, 6 min (BUE/LE)   seat #7, arm #8)     Knee/Hip Exercises: Standing   Hip Abduction Stengthening;Right;Left;1 set;10 reps    Abduction Limitations RTB around ankles    Hip Extension Stengthening;Right;Left;1 set;10 reps    Extension Limitations RTB around ankles    Functional Squat 5 sets;10 seconds    Functional Squat Limitations RTB above knees, eccentric lower to mat table    Other Standing Knee Exercises Side stepping GTB around ankles 1 lap      Knee/Hip Exercises: Sidelying   Clams x15 B RTB   manual approximation R hip for clams                   PT Short Term Goals - 04/23/20 1440      PT SHORT TERM GOAL #1   Title Patient to be independent with initial HEP.    Time 4    Period Weeks    Status On-going    Target Date 05/14/20             PT Long Term Goals - 04/23/20 1440      PT LONG TERM GOAL #1   Title Patient  to be independent with advanced HEP.    Time 8    Period Weeks    Status On-going      PT LONG TERM GOAL #2   Title Patient to demonstrate B LE strength >/=4/5.    Time 8    Period Weeks    Status On-going      PT LONG TERM GOAL #3   Title Patient to improve cervical AROM by 15 degrees in limited planes without pain limiting.    Time 8    Period Weeks    Status On-going      PT LONG TERM GOAL #4   Title Patient to improve demonstrate lumbar AROM WFL without pain limiting.    Time 8    Period Weeks    Status On-going      PT LONG TERM GOAL #5   Title Patient to demonstrate B shoulder AROM WFL and without pain limiting.    Time 8    Period Weeks    Status On-going      PT LONG TERM GOAL #6   Title Patient to demonstrate ability to make closed fist on R hand without pain limiting.    Time 8    Period Weeks    Status On-going      PT LONG TERM GOAL #7   Title Patient to report 75% improvement in pain levels.    Time 8    Period Weeks    Status On-going                 Plan - 05/11/20 1116     Clinical Impression Statement Morgan Ruiz presents with c/o R medial knee pain since last session with descending stairs, so session focused around hip strengthening in multiple positions static and dynamic. Pt reported mild LBP with mini squat side step, but none with modification to standing side stand - provided green loop for home use. Pt demo good form with all exercises after verbal and visual cues for correct posture. She will continue to benefit from functional strengthening to address weaknesses and impairments affecting daily life.    Comorbidities HTN, HLD, DM, back surgery 2018, R carpal tunnel release 2019    Rehab Potential Good    PT Frequency 2x / week    PT Treatment/Interventions ADLs/Self Care Home Management;Cryotherapy;Electrical Stimulation;Moist Heat;Iontophoresis 4mg /ml Dexamethasone;Balance training;Therapeutic exercise;Therapeutic activities;Functional mobility training;Stair training;Gait training;Ultrasound;Neuromuscular re-education;Patient/family education;Manual techniques;Vasopneumatic Device;Taping;Energy conservation;Dry needling;Passive range of motion;Scar mobilization    PT Next Visit Plan Address multiple areas of pain with preference for area of most concern; generalized strengthening of LEs and cervical/hip stretching    Consulted and Agree with Plan of Care Patient           Patient will benefit from skilled therapeutic intervention in order to improve the following deficits and impairments:  Hypomobility, Decreased activity tolerance, Decreased strength, Pain, Impaired UE functional use, Increased fascial restricitons, Decreased balance, Difficulty walking, Increased muscle spasms, Improper body mechanics, Decreased range of motion, Impaired flexibility, Postural dysfunction  Visit Diagnosis: Cervicalgia  Pain in right finger(s)  Acute pain of right shoulder  Muscle weakness (generalized)  Chronic left shoulder pain  Difficulty in walking, not elsewhere  classified  Chronic bilateral low back pain with bilateral sciatica     Problem List Patient Active Problem List   Diagnosis Date Noted  . Vitamin D deficiency 04/08/2020  . Epigastric discomfort 01/25/2019  . History of peptic ulcer disease 01/25/2019  . Diabetes mellitus without complication (HCC) 01/24/2016  .  Hypertension 01/24/2016  . Hyperlipidemia 01/24/2016    Marcelline Mates, PT, DPT 05/11/2020, 12:04 PM  [facility] [address]  [address] [place], [place], [address] Phone: [phone]   Fax:  [phone]  Name: Morgan Ruiz MRN: [id number] Date of Birth: March 12, 1951

## 2020-05-12 ENCOUNTER — Telehealth: Payer: Self-pay | Admitting: Family Medicine

## 2020-05-12 ENCOUNTER — Telehealth: Payer: Self-pay

## 2020-05-12 NOTE — Telephone Encounter (Signed)
Patient called requesting a call back from Dr. Prince Rome. 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 Prince Rome. Patient phone number is [phone]

## 2020-05-12 NOTE — Telephone Encounter (Signed)
Patient would like a call from Dr. Prince Rome concerning her medication.  Cb# [phone].  Please advise.  Thank you.

## 2020-05-13 ENCOUNTER — Other Ambulatory Visit: Payer: Self-pay | Admitting: Family Medicine

## 2020-05-13 MED ORDER — DICLOFENAC SODIUM 1 % EX GEL: 4.0000 g | Freq: Four times a day (QID) | CUTANEOUS | 6 refills | Status: DC | PRN

## 2020-05-13 NOTE — Telephone Encounter (Signed)
The patient came by the office today - Dr. Prince Rome addressed her issues.

## 2020-05-14 ENCOUNTER — Ambulatory Visit: Payer: Medicare Other | Admitting: Physical Therapy

## 2020-05-14 ENCOUNTER — Encounter: Payer: Self-pay | Admitting: Physical Therapy

## 2020-05-14 ENCOUNTER — Other Ambulatory Visit: Payer: Self-pay

## 2020-05-14 DIAGNOSIS — M25511 Pain in right shoulder: Secondary | ICD-10-CM

## 2020-05-14 DIAGNOSIS — M6281 Muscle weakness (generalized): Secondary | ICD-10-CM | POA: Diagnosis not present

## 2020-05-14 DIAGNOSIS — M79644 Pain in right finger(s): Secondary | ICD-10-CM

## 2020-05-14 DIAGNOSIS — M5442 Lumbago with sciatica, left side: Secondary | ICD-10-CM | POA: Diagnosis not present

## 2020-05-14 DIAGNOSIS — R262 Difficulty in walking, not elsewhere classified: Secondary | ICD-10-CM | POA: Diagnosis not present

## 2020-05-14 DIAGNOSIS — G8929 Other chronic pain: Secondary | ICD-10-CM

## 2020-05-14 DIAGNOSIS — M542 Cervicalgia: Secondary | ICD-10-CM

## 2020-05-14 DIAGNOSIS — M25512 Pain in left shoulder: Secondary | ICD-10-CM | POA: Diagnosis not present

## 2020-05-14 DIAGNOSIS — M5441 Lumbago with sciatica, right side: Secondary | ICD-10-CM | POA: Diagnosis not present

## 2020-05-14 NOTE — Therapy (Addendum)
[facility] [place] [address]  [place], [place], [address] Phone: [phone]   Fax:  [phone]  Physical Therapy Treatment  Patient Details  Name: Morgan Ruiz MRN: [id number] Date of Birth: 1951-07-12 Referring Provider (PT): Eunice Blase, MD   Progress Note Reporting Period 04/16/20 to 05/14/20  See note below for Objective Data and Assessment of Progress/Goals.     Encounter Date: 05/14/2020   PT End of Session - 05/14/20 1534    Visit Number 7    Number of Visits 17    Date for PT Re-Evaluation 06/11/20    Authorization Type UHC Medicare    PT Start Time 1534    PT Stop Time 1616    PT Time Calculation (min) 42 min    Activity Tolerance Patient tolerated treatment well    Behavior During Therapy WFL for tasks assessed/performed           Past Medical History:  Diagnosis Date  . Diabetes mellitus without complication ([place])   . Hyperlipidemia   . Hypertension     History reviewed. No pertinent surgical history.  There were no vitals filed for this visit.   Subjective Assessment - 05/14/20 1538    Subjective Pt reports she has constant pain but most of the time she does not feel that it slows her down.    Pertinent History HTN, HLD, DM, back surgery 2018, R carpal tunnel release 2019    Diagnostic tests lumbar MRI 11/15/16: There is multilevel degenerative disc disease and facet joint arthropathy.R RThere is a disc protrusion, more on the right at L5-S1, which may impinge on the Rexiting right L5    Patient Stated Goals "help me with the weakness"    Currently in Pain? Yes    Pain Score 0-No pain    Pain Location Neck   & upper shoulders   Pain Orientation Lower    Pain Descriptors / Indicators Tightness   "stiff"   Pain Frequency Constant    Pain Score 4   3-4/10   Pain Location Finger (Comment which one)   R 4th digit   Pain Orientation Right    Pain Descriptors / Indicators Constant     Pain Frequency Constant    Pain Score 8    Pain Location Knee    Pain Orientation Right;Medial    Pain Descriptors / Indicators Constant    Pain Type Chronic pain                             [facility] Adult PT Treatment/Exercise - 05/14/20 1534      Neck Exercises: Machines for Strengthening   Nustep L4 x 6 min (UE/LE)   seat #7, arm #8)     Lumbar Exercises: Stretches   Passive Hamstring Stretch Right;30 seconds;2 reps    Passive Hamstring Stretch Limitations supine with strap; 2nd rep + DF for calf stretch    Piriformis Stretch Right;30 seconds;2 reps      Lumbar Exercises: Supine   Pelvic Tilt 10 reps;5 seconds    Bent Knee Raise 10 reps;3 seconds    Bent Knee Raise Limitations brace marching    Bridge 10 reps;5 seconds    Bridge Limitations cues for abd bracing & glute set before initiating lift    Other Supine Lumbar Exercises R sciatic nerve glide x 10      Manual Therapy   Manual Therapy Soft tissue  mobilization;Myofascial release;Neural Stretch    Soft tissue mobilization STM/DTM to R gastroc/soleus, peroneals, medial quads                    PT Short Term Goals - 04/23/20 1440      PT SHORT TERM GOAL #1   Title Patient to be independent with initial HEP.    Time 4    Period Weeks    Status On-going    Target Date 05/14/20             PT Long Term Goals - 04/23/20 1440      PT LONG TERM GOAL #1   Title Patient to be independent with advanced HEP.    Time 8    Period Weeks    Status On-going      PT LONG TERM GOAL #2   Title Patient to demonstrate B LE strength >/=4/5.    Time 8    Period Weeks    Status On-going      PT LONG TERM GOAL #3   Title Patient to improve cervical AROM by 15 degrees in limited planes without pain limiting.    Time 8    Period Weeks    Status On-going      PT LONG TERM GOAL #4   Title Patient to improve demonstrate lumbar AROM WFL without pain limiting.    Time 8    Period Weeks    Status  On-going      PT LONG TERM GOAL #5   Title Patient to demonstrate B shoulder AROM WFL and without pain limiting.    Time 8    Period Weeks    Status On-going      PT LONG TERM GOAL #6   Title Patient to demonstrate ability to make closed fist on R hand without pain limiting.    Time 8    Period Weeks    Status On-going      PT LONG TERM GOAL #7   Title Patient to report 75% improvement in pain levels.    Time 8    Period Weeks    Status On-going                 Plan - 05/14/20 1546    Clinical Impression Statement Jaxon reporting increased R knee and distal LE pain today and wishing to focus on this today. Pain appears to be at least somewhat radicular in nature, but some increased muscle tension noted in R gastroc/soleus, peroneals, medial quads which was addressed with manual STM/DTM with pt noting good relief. Therapeutic activities focusing on exercises for strengthening for core and lumbopelvic stability as well as LE flexibility, with sciatic nerve glides introduced to address radicular pain. Pt reporting good tolerance for all activities. Will plan to update HEP according to response to today's session next visit.    Personal Factors and Comorbidities Age;Sex;Comorbidity 3+;Fitness;Past/Current Experience;Profession;Time since onset of injury/illness/exacerbation    Comorbidities HTN, HLD, DM, back surgery 2018, R carpal tunnel release 2019    Examination-Activity Limitations Sit;Sleep;Bed Mobility;Bend;Squat;Stairs;Carry;Stand;Dressing;Transfers;Hygiene/Grooming;Lift;Locomotion Level;Reach Overhead;Self Feeding    Examination-Participation Restrictions Church;Cleaning;Shop;Community Activity;Driving;Yard Work;Interpersonal Relationship;Laundry;Meal Prep    Rehab Potential Good    PT Frequency 2x / week    PT Duration 8 weeks    PT Treatment/Interventions ADLs/Self Care Home Management;Cryotherapy;Electrical Stimulation;Moist Heat;Iontophoresis 4mg/ml  Dexamethasone;Balance training;Therapeutic exercise;Therapeutic activities;Functional mobility training;Stair training;Gait training;Ultrasound;Neuromuscular re-education;Patient/family education;Manual techniques;Vasopneumatic Device;Taping;Energy conservation;Dry needling;Passive range of motion;Scar mobilization    PT Next Visit Plan   Address multiple areas of pain with preference for area of most concern; generalized strengthening of LEs and cervical/hip stretching    Consulted and Agree with Plan of Care Patient           Patient will benefit from skilled therapeutic intervention in order to improve the following deficits and impairments:  Hypomobility, Decreased activity tolerance, Decreased strength, Pain, Impaired UE functional use, Increased fascial restricitons, Decreased balance, Difficulty walking, Increased muscle spasms, Improper body mechanics, Decreased range of motion, Impaired flexibility, Postural dysfunction  Visit Diagnosis: Cervicalgia  Acute pain of right shoulder  Chronic left shoulder pain  Chronic bilateral low back pain with bilateral sciatica  Pain in right finger(s)  Muscle weakness (generalized)  Difficulty in walking, not elsewhere classified     Problem List Patient Active Problem List   Diagnosis Date Noted  . Vitamin D deficiency 04/08/2020  . Epigastric discomfort 01/25/2019  . History of peptic ulcer disease 01/25/2019  . Diabetes mellitus without complication (HCC) 01/24/2016  . Hypertension 01/24/2016  . Hyperlipidemia 01/24/2016    JoAnne M Kreis, PT, MPT 05/14/2020, 8:51 PM  [facility] [place] [facility] [address]  [address] [place], [place], [address] Phone: [phone]   Fax:  [phone]  Name: Aldina Chahal MRN: [id number] Date of Birth: 02/16/1951  PHYSICAL THERAPY DISCHARGE SUMMARY  Visits from Start of Care: 7  Current functional level related to goals / functional  outcomes: Unable to assess; patient did not return   Remaining deficits: Unable to assess   Education / Equipment: HEP  Plan: Patient agrees to discharge.  Patient goals were not met. Patient is being discharged due to not returning since the last visit.  ?????     Yevgeniya Kovalenko, PT, DPT 06/25/20 2:25 PM  

## 2020-05-19 ENCOUNTER — Ambulatory Visit: Payer: Medicare Other | Admitting: Physical Therapy

## 2020-05-21 ENCOUNTER — Ambulatory Visit: Payer: Medicare Other | Admitting: Physical Therapy

## 2020-05-26 ENCOUNTER — Ambulatory Visit: Payer: Medicare Other | Admitting: Physical Therapy

## 2020-05-28 ENCOUNTER — Ambulatory Visit: Payer: Medicare Other | Admitting: Physical Therapy

## 2020-06-02 ENCOUNTER — Ambulatory Visit: Payer: Medicare Other

## 2020-06-04 ENCOUNTER — Ambulatory Visit: Payer: Medicare Other

## 2020-06-09 ENCOUNTER — Ambulatory Visit: Payer: Medicare Other

## 2020-06-11 ENCOUNTER — Ambulatory Visit: Payer: Medicare Other | Admitting: Physical Therapy

## 2020-06-16 ENCOUNTER — Ambulatory Visit: Payer: Medicare Other

## 2020-06-18 ENCOUNTER — Ambulatory Visit: Payer: Medicare Other | Admitting: Physical Therapy

## 2020-07-02 NOTE — Progress Notes (Signed)
CARDIOLOGY CONSULT NOTE       Patient ID: Morgan Ruiz MRN: [id number] DOB/AGE: [birth year] 69 y.o.  Admit date: (Not on file) Referring Physician: [place] Primary Physician: Lavada Mesi, MD Primary Cardiologist: New Reason for Consultation: Family History Cardiac Dx  Active Problems:   * No active hospital problems. *   HPI:  69 y.o. referred by Dr Prince Rome for HTN, fatigue , dyspnea and family history of heart condition. ? Born with congenital abnormality Has not seen cardiologist in years Twin brother Had "it" as well and died last year No chest pain Her BP has been well controlled on ARB/diuretic She takes omeprazole for GERD and has some degenerative right knee pain Reviewed CXR done 12/17/19 no CE or shunt vascularity NAD Seen in [facility] ED 08/07/19 for elevated BP, dizziness and left arm pain R/O CT head negative BP improved as outpatient with addition of diuretic  She is from [place]. She indicates brother dying of some heart issue. She has atypical sharp non cardiac chest pains very infrequently History of more juvenile vasovagal syncope Some chronic fatigue Came to states 22 years ago to be closer to her only daughter and two grand children   ROS All other systems reviewed and negative except as noted above  Past Medical History:  Diagnosis Date  . Diabetes mellitus without complication (HCC)   . Hyperlipidemia   . Hypertension     Family History  Problem Relation Age of Onset  . Diabetes type II Mother   . Heart attack Mother   . Liver cancer Father   . Heart attack Brother     Social History   Socioeconomic History  . Marital status: Married    Spouse name: Not on file  . Number of children: Not on file  . Years of education: Not on file  . Highest education level: Not on file  Occupational History  . Occupation: Nature conservation officer  Tobacco Use  . Smoking status: Former Smoker    Quit date: 11/21/2012    Years since quitting: 7.6  . Smokeless tobacco:  Never Used  . Tobacco comment: social smoker  Vaping Use  . Vaping Use: Never used  Substance and Sexual Activity  . Alcohol use: No    Alcohol/week: 0.0 standard drinks  . Drug use: No  . Sexual activity: Yes  Other Topics Concern  . Not on file  Social History Narrative   From [place] - to [place] in 1999   Married    daughter and 2 grandchild   Social Determinants of Health   Financial Resource Strain:   . Difficulty of Paying Living Expenses: Not on file  Food Insecurity:   . Worried About Programme researcher, broadcasting/film/video in the Last Year: Not on file  . Ran Out of Food in the Last Year: Not on file  Transportation Needs:   . Lack of Transportation (Medical): Not on file  . Lack of Transportation (Non-Medical): Not on file  Physical Activity:   . Days of Exercise per Week: Not on file  . Minutes of Exercise per Session: Not on file  Stress:   . Feeling of Stress : Not on file  Social Connections:   . Frequency of Communication with Friends and Family: Not on file  . Frequency of Social Gatherings with Friends and Family: Not on file  . Attends Religious Services: Not on file  . Active Member of Clubs or Organizations: Not on file  . Attends Banker Meetings: Not  on file  . Marital Status: Not on file  Intimate Partner Violence:   . Fear of Current or Ex-Partner: Not on file  . Emotionally Abused: Not on file  . Physically Abused: Not on file  . Sexually Abused: Not on file    History reviewed. No pertinent surgical history.    Current Outpatient Medications:  .  cyclobenzaprine (FLEXERIL) 10 MG tablet, Take 0.5-1 tablets (5-10 mg total) by mouth 3 (three) times daily as needed., Disp: 60 tablet, Rfl: 0 .  diclofenac Sodium (VOLTAREN) 1 % GEL, Apply 4 g topically 4 (four) times daily as needed., Disp: 500 g, Rfl: 6 .  gabapentin (NEURONTIN) 100 MG capsule, 1 PO q HS, may increase to 1 PO TID if needed, Disp: 90 capsule, Rfl: 3 .  hydrochlorothiazide (HYDRODIURIL) 12.5  MG tablet, Take 1 tablet (12.5 mg total) by mouth daily., Disp: 30 tablet, Rfl: 6 .  nitrofurantoin, macrocrystal-monohydrate, (MACROBID) 100 MG capsule, Take 1 capsule (100 mg total) by mouth 2 (two) times daily., Disp: 14 capsule, Rfl: 0 .  olmesartan (BENICAR) 20 MG tablet, Take 1 tablet (20 mg total) by mouth daily., Disp: 30 tablet, Rfl: 6 .  omeprazole (PRILOSEC) 20 MG capsule, Take 1 capsule (20 mg total) by mouth daily., Disp: 90 capsule, Rfl: 3 .  traMADol (ULTRAM) 50 MG tablet, Take 50-100 mg by mouth 4 (four) times daily as needed., Disp: , Rfl:     Physical Exam: Height 5\' 7"  (1.702 m).   Affect appropriate Healthy:  appears stated age HEENT: normal Neck supple with no adenopathy JVP normal no bruits no thyromegaly Lungs clear with no wheezing and good diaphragmatic motion Heart:  S1/S2 no murmur, no rub, gallop or click PMI normal Abdomen: benighn, BS positve, no tenderness, no AAA no bruit.  No HSM or HJR Distal pulses intact with no bruits No edema Neuro non-focal Skin warm and dry No muscular weakness   Labs:   Lab Results  Component Value Date   WBC 7.5 04/08/2020   HGB 13.7 04/08/2020   HCT 40.7 04/08/2020   MCV 87.9 04/08/2020   PLT 376 04/08/2020   No results for input(s): NA, K, CL, CO2, BUN, CREATININE, CALCIUM, PROT, BILITOT, ALKPHOS, ALT, AST, GLUCOSE in the last 168 hours.  Invalid input(s): LABALBU Lab Results  Component Value Date   CKTOTAL 54 09/14/2007   CKMB 0.5 09/14/2007   TROPONINI 0.01        NO INDICATION OF MYOCARDIAL INJURY. 09/14/2007    Lab Results  Component Value Date   CHOL 222 (H) 08/19/2017   Lab Results  Component Value Date   HDL 55 08/19/2017   Lab Results  Component Value Date   LDLCALC 143 (H) 08/19/2017   Lab Results  Component Value Date   TRIG 120 08/19/2017   Lab Results  Component Value Date   CHOLHDL 4.0 08/19/2017   No results found for: LDLDIRECT    Radiology: No results found.   EKG: SR  rate 72 normal 08/08/19    ASSESSMENT AND PLAN:   1. GERD:  Low carb diet continue PPI 2. HTN:  Well controlled.  Continue current medications and low sodium Dash type diet.   3. Ortho: degenerative right knee pain Voltaren gel f/u Dr 08/10/19  4. Cardiac:  Vague history she has no murmur on exam pain in chest is non cardiac. She does not need any cardiac testing at this time    Signed: Prince Rome 07/09/2020, 8:31 AM

## 2020-07-07 DIAGNOSIS — M2550 Pain in unspecified joint: Secondary | ICD-10-CM | POA: Diagnosis not present

## 2020-07-07 DIAGNOSIS — R5382 Chronic fatigue, unspecified: Secondary | ICD-10-CM | POA: Diagnosis not present

## 2020-07-07 DIAGNOSIS — M542 Cervicalgia: Secondary | ICD-10-CM | POA: Diagnosis not present

## 2020-07-09 ENCOUNTER — Ambulatory Visit: Payer: Medicare Other | Admitting: Cardiovascular Disease

## 2020-07-09 ENCOUNTER — Encounter (INDEPENDENT_AMBULATORY_CARE_PROVIDER_SITE_OTHER): Payer: Self-pay

## 2020-07-09 ENCOUNTER — Other Ambulatory Visit: Payer: Self-pay

## 2020-07-09 ENCOUNTER — Encounter: Payer: Self-pay | Admitting: Cardiovascular Disease

## 2020-07-09 VITALS — BP 122/62 | HR 71 | Ht 67.0 in | Wt 184.0 lb

## 2020-07-09 DIAGNOSIS — Z8249 Family history of ischemic heart disease and other diseases of the circulatory system: Secondary | ICD-10-CM | POA: Diagnosis not present

## 2020-07-09 NOTE — Patient Instructions (Addendum)

## 2020-07-21 ENCOUNTER — Other Ambulatory Visit: Payer: Self-pay

## 2020-07-21 ENCOUNTER — Encounter: Payer: Self-pay | Admitting: Family Medicine

## 2020-07-21 ENCOUNTER — Ambulatory Visit (INDEPENDENT_AMBULATORY_CARE_PROVIDER_SITE_OTHER): Payer: Medicare Other | Admitting: Family Medicine

## 2020-07-21 DIAGNOSIS — R109 Unspecified abdominal pain: Secondary | ICD-10-CM

## 2020-07-21 MED ORDER — NITROFURANTOIN MONOHYD MACRO 100 MG PO CAPS: 100.0000 mg | ORAL_CAPSULE | Freq: Two times a day (BID) | ORAL | 0 refills | Status: DC

## 2020-07-21 NOTE — Progress Notes (Signed)
Office Visit Note   Patient: Morgan Ruiz           Date of Birth: 1951/11/02           MRN: [id number] Visit Date: 07/21/2020 Requested by: Lavada Mesi, MD [address] [place],  [place] [address] PCP: Lavada Mesi, MD  Subjective: Chief Complaint  Patient presents with  . Lower Back - Pain    Pain in the back, really thinks it the kidneys, started on 07/17/20, hurts to breath, pain in mid back, c/o lateral sides of hips.     HPI: 69 year old female presenting to clinic today with approximately 5 days of bilateral flank pain.  She states that she has had similar pain approximately 8 years ago, when she was diagnosed with a kidney infection and stones.  She is concerned that she may be experiencing a similar infection today.  Patient denies any trauma to her lower back, and states that pain does not radiate inferiorly-though she does have some radiating pain into her right shoulder (which she feels may actually predate her flank pain).  In addition to her flank pain, she states that she has noticed a foul smell in her urine.  No obvious blood in her urine, no burning with urination, or other urinary symptoms.  She denies any systemic symptoms of fever, chills, or nausea.               ROS:   All other systems were reviewed and are negative.  Objective: Vital Signs: There were no vitals taken for this visit.  Physical Exam:  General:  Alert and oriented, in no acute distress. Cardiac: Appears well perfused. Pulm:  Breathing unlabored. Abdominal: Nondistended, nontender.  No significant suprapubic tenderness. Psy:  Normal mood, congruent affect. Skin: No bruising or rashes appreciated. Back exam: CVA tenderness bilaterally.  No midline tenderness, no tenderness over thoracic or lumbar paraspinal muscles.   Imaging: No results found.  Assessment & Plan: 69 year old female presenting to clinic today with concerns of 5 days of bilateral flank pain, in the setting of foul  urinary odor.  -We will obtain urinary studies today to evaluate for underlying UTI or hematuria.  -We will empirically treat with Macrobid. -No systemic symptoms to suggest more serious infection at this time, though strict return precautions were discussed.  -Patient was agreeable with assessment and plan, and had no further questions today.      Procedures: No procedures performed  No notes on file     PMFS History: Patient Active Problem List   Diagnosis Date Noted  . Vitamin D deficiency 04/08/2020  . Epigastric discomfort 01/25/2019  . History of peptic ulcer disease 01/25/2019  . Diabetes mellitus without complication (HCC) 01/24/2016  . Hypertension 01/24/2016  . Hyperlipidemia 01/24/2016   Past Medical History:  Diagnosis Date  . Diabetes mellitus without complication (HCC)   . Hyperlipidemia   . Hypertension     Family History  Problem Relation Age of Onset  . Diabetes type II Mother   . Heart attack Mother   . Liver cancer Father   . Heart attack Brother     History reviewed. No pertinent surgical history. Social History   Occupational History  . Occupation: Nature conservation officer  Tobacco Use  . Smoking status: Former Smoker    Quit date: 11/21/2012    Years since quitting: 7.6  . Smokeless tobacco: Never Used  . Tobacco comment: social smoker  Vaping Use  . Vaping Use: Never used  Substance  and Sexual Activity  . Alcohol use: No    Alcohol/week: 0.0 standard drinks  . Drug use: No  . Sexual activity: Yes

## 2020-07-21 NOTE — Progress Notes (Signed)
I saw and examined the patient with Dr. Marga Hoots and agree with assessment and plan as outlined.    Bilateral flank pain since Friday.  Similar pain to when she had a kidney infection about 8 years ago.  No fever, no dysuria.  But she's had an odor to her urine leading up to her pain.  No rash on skin.  Bilateral CVA tenderness.  Will obtain urine specimen and presumptively treat with macrobid.  Consider CT if hematuria present and doesn't respond to antibiotic.

## 2020-07-22 DIAGNOSIS — R109 Unspecified abdominal pain: Secondary | ICD-10-CM | POA: Diagnosis not present

## 2020-07-23 ENCOUNTER — Telehealth: Payer: Self-pay

## 2020-07-23 NOTE — Telephone Encounter (Signed)
The patient has been out of work since 07/17/20, due to the back/flank pain. She is asking for a work note to excuse her from that date until 07/24/20. The patient did advise that the antibiotic seems to be working, as the pain is subsiding. Please advise on the note.

## 2020-07-23 NOTE — Telephone Encounter (Signed)
Ok to give note 

## 2020-07-24 NOTE — Telephone Encounter (Signed)
Left voice mail that the letter (and the envelope/form she brought the other day) are ready for pickup downstairs at the desk.

## 2020-07-25 LAB — URINALYSIS W MICROSCOPIC + REFLEX CULTURE
Bacteria, UA: NONE SEEN /HPF
Bilirubin Urine: NEGATIVE
Hgb urine dipstick: NEGATIVE
Hyaline Cast: NONE SEEN /LPF
Ketones, ur: NEGATIVE
Nitrites, Initial: NEGATIVE
Protein, ur: NEGATIVE
RBC / HPF: NONE SEEN /HPF (ref 0–2)
Specific Gravity, Urine: 1.015 (ref 1.001–1.03)
Squamous Epithelial / HPF: NONE SEEN /HPF (ref <=5)
pH: 6 (ref 5.0–8.0)

## 2020-07-25 LAB — URINE CULTURE
MICRO NUMBER:: 10900730
SPECIMEN QUALITY:: ADEQUATE

## 2020-07-25 LAB — CULTURE INDICATED

## 2020-07-28 DIAGNOSIS — B349 Viral infection, unspecified: Secondary | ICD-10-CM | POA: Diagnosis not present

## 2020-07-29 DIAGNOSIS — R350 Frequency of micturition: Secondary | ICD-10-CM | POA: Diagnosis not present

## 2020-07-29 DIAGNOSIS — R300 Dysuria: Secondary | ICD-10-CM | POA: Diagnosis not present

## 2020-07-29 DIAGNOSIS — M545 Low back pain: Secondary | ICD-10-CM | POA: Diagnosis not present

## 2020-08-05 ENCOUNTER — Other Ambulatory Visit: Payer: Self-pay | Admitting: Family Medicine

## 2020-08-05 DIAGNOSIS — I10 Essential (primary) hypertension: Secondary | ICD-10-CM

## 2020-08-06 DIAGNOSIS — Z20822 Contact with and (suspected) exposure to covid-19: Secondary | ICD-10-CM | POA: Diagnosis not present

## 2020-08-14 ENCOUNTER — Telehealth: Payer: Self-pay | Admitting: Family Medicine

## 2020-08-14 MED ORDER — SULFAMETHOXAZOLE-TRIMETHOPRIM 800-160 MG PO TABS: 1.0000 | ORAL_TABLET | Freq: Two times a day (BID) | ORAL | 0 refills | Status: DC

## 2020-08-14 NOTE — Telephone Encounter (Signed)
Left his information on the patient's mobile voice mail. Advised her to call us back with any questions/problems.

## 2020-08-14 NOTE — Telephone Encounter (Signed)
Urine shows bacterial infection.  Will call in bactrim.

## 2020-09-20 DIAGNOSIS — L03011 Cellulitis of right finger: Secondary | ICD-10-CM | POA: Diagnosis not present

## 2020-09-20 DIAGNOSIS — Z23 Encounter for immunization: Secondary | ICD-10-CM | POA: Diagnosis not present

## 2020-09-20 DIAGNOSIS — W5501XA Bitten by cat, initial encounter: Secondary | ICD-10-CM | POA: Diagnosis not present

## 2020-12-10 ENCOUNTER — Other Ambulatory Visit: Payer: Self-pay | Admitting: Family Medicine

## 2021-01-15 DIAGNOSIS — R7301 Impaired fasting glucose: Secondary | ICD-10-CM | POA: Diagnosis not present

## 2021-01-15 DIAGNOSIS — I10 Essential (primary) hypertension: Secondary | ICD-10-CM | POA: Diagnosis not present

## 2021-01-15 DIAGNOSIS — Z0000 Encounter for general adult medical examination without abnormal findings: Secondary | ICD-10-CM | POA: Diagnosis not present

## 2021-01-15 DIAGNOSIS — G629 Polyneuropathy, unspecified: Secondary | ICD-10-CM | POA: Diagnosis not present

## 2021-01-29 ENCOUNTER — Other Ambulatory Visit: Payer: Self-pay | Admitting: Family Medicine

## 2021-02-10 DIAGNOSIS — Z20822 Contact with and (suspected) exposure to covid-19: Secondary | ICD-10-CM | POA: Diagnosis not present

## 2021-03-25 DIAGNOSIS — M5440 Lumbago with sciatica, unspecified side: Secondary | ICD-10-CM | POA: Diagnosis not present

## 2021-03-31 ENCOUNTER — Telehealth: Payer: Self-pay | Admitting: Family Medicine

## 2021-03-31 ENCOUNTER — Other Ambulatory Visit: Payer: Self-pay | Admitting: Family Medicine

## 2021-03-31 MED ORDER — SUCRALFATE 1 G PO TABS: 1.0000 g | ORAL_TABLET | Freq: Three times a day (TID) | ORAL | 1 refills | Status: DC

## 2021-03-31 NOTE — Telephone Encounter (Signed)
If stool is black, she should probably go to urgent care or ER (unless she's using Pepto Bismol, which will make it black).  Black stool can indicate bleeding ulcer, and should be seen quickly.

## 2021-03-31 NOTE — Progress Notes (Signed)
I called the patient. We had a cancellation for 1:20 this Friday. She said she can come then. Advised her to continue her omeprazole, but also add this new medication (carafate). She voiced understanding.

## 2021-03-31 NOTE — Telephone Encounter (Signed)
Pt called stating she usually sees Dr Prince Rome for back pain and her kidneys; but recently her ulcer has been giving her a lot of pain. Pt states her stool has been black so she would like to get an appt on 04/02/21.   [phone]

## 2021-03-31 NOTE — Telephone Encounter (Signed)
I called the patient: she does not want to go to the ED "because they just give you medicine and tell you to see your doctor." She describes her pain as epigastric and through to her back. She takes omeprazole 20 mg every day. This usually helps, but not now. She had 1 instance of seeing black stool -- her bowel movements have been no different until that time. Dr. Prince Rome sent in an Rx of carafate to take with the omeprazole. Will keep a check on Friday's schedule to see if there is a cancellation (see other message from Dr. Prince Rome from today, regarding carafate Rx).

## 2021-03-31 NOTE — Telephone Encounter (Signed)
I offered her an appointment this afternoon, as we had a cancellation. She is unable to come in until 5/13. There are no openings on that day, unless someone cancels. Please advise on working her in, or if there are any other instructions for her.

## 2021-04-02 ENCOUNTER — Ambulatory Visit (INDEPENDENT_AMBULATORY_CARE_PROVIDER_SITE_OTHER): Payer: Medicare Other | Admitting: Family Medicine

## 2021-04-02 ENCOUNTER — Other Ambulatory Visit: Payer: Self-pay

## 2021-04-02 VITALS — BP 112/70 | HR 77 | Ht 67.0 in | Wt 185.2 lb

## 2021-04-02 DIAGNOSIS — R1011 Right upper quadrant pain: Secondary | ICD-10-CM | POA: Diagnosis not present

## 2021-04-02 DIAGNOSIS — R7989 Other specified abnormal findings of blood chemistry: Secondary | ICD-10-CM

## 2021-04-02 DIAGNOSIS — R932 Abnormal findings on diagnostic imaging of liver and biliary tract: Secondary | ICD-10-CM | POA: Diagnosis not present

## 2021-04-02 DIAGNOSIS — G8929 Other chronic pain: Secondary | ICD-10-CM | POA: Diagnosis not present

## 2021-04-02 DIAGNOSIS — Z800 Family history of malignant neoplasm of digestive organs: Secondary | ICD-10-CM

## 2021-04-02 DIAGNOSIS — M545 Low back pain, unspecified: Secondary | ICD-10-CM | POA: Diagnosis not present

## 2021-04-02 DIAGNOSIS — K760 Fatty (change of) liver, not elsewhere classified: Secondary | ICD-10-CM | POA: Diagnosis not present

## 2021-04-02 DIAGNOSIS — M542 Cervicalgia: Secondary | ICD-10-CM | POA: Diagnosis not present

## 2021-04-02 NOTE — Progress Notes (Signed)
Office Visit Note   Patient: Morgan Ruiz           Date of Birth: 1951-02-21           MRN: [id number] Visit Date: 04/02/2021 Requested by: Lavada Mesi, MD [address] [place],  [place] [address] PCP: Lavada Mesi, MD  Subjective: Chief Complaint  Patient presents with  . Abdominal Pain    Epigastric and RUQ pain. Occasional bloating. Sometimes she has difficulty with foot feeling stuck in her throat. Occasional dry heaves. Constant pain in the lower back bilaterally. Her father and grandfather both had liver cancer. Patient would like a CT or MRI to check her abdomen.    HPI: She is here with multiple concerns.  In the past 6 or 8 weeks she has had right upper quadrant abdominal pain.  She has had this in the past, but it went away for a while.  Both her father and grandfather had liver cancer and had no other risk factors.  She is concerned about this possibility.  Denies any change in bowel function.  Pain is worse when bending forward.  She occasionally feels a firmness in that area.  No fevers or chills, no unintentional weight change.  Her neck has bothered her chronically.  Many years ago we took x-rays of her cervical spine which showed a substantial amount of degenerative change.  She has tried physical therapy and chiropractic but nothing really helped.  Lately her pain has gotten worse with tingling sensation in her upper back and occasionally into the arms.  She also has chronic low back pain.  She has done physical therapy and chiropractic for it as well.  X-rays in 2018 showed multilevel degenerative change.  She has been having some weakness in her right thigh recently.               ROS:   All other systems were reviewed and are negative.  Objective: Vital Signs: BP 112/70   Pulse 77   Ht 5\' 7"  (1.702 m)   Wt 185 lb 3.2 oz (84 kg)   BMI 29.01 kg/m   Physical Exam:  General:  Alert and oriented, in no acute distress. Pulm:  Breathing unlabored. Psy:   Normal mood, congruent affect  Neck: She has tenderness in the paraspinous muscles in the upper thoracic and lower cervical spine.  She still has pretty good range of motion of her neck but pain at the extremes of rotation. Low back: Tender mainly near the L4-5 and L5-S1 levels in the midline.  Straight leg raise negative bilaterally. Abdomen: No hepatomegaly but she is very tender in the right upper quadrant, no rebound or guarding.  Slight fullness in that area in the soft tissues.   Imaging: No results found.  Assessment & Plan: 1.  Right upper quadrant abdominal pain with family history of liver cancer -Labs to check hepatic function and AFP.  MRI with contrast to further evaluate.  2.  Chronic neck pain with spondylosis -New x-rays and MRI scan to further evaluate.  3.  Chronic low back pain with spondylosis -New x-rays and MRI scan.  Could contemplate injections depending on the findings.     Procedures: No procedures performed        PMFS History: Patient Active Problem List   Diagnosis Date Noted  . Vitamin D deficiency 04/08/2020  . Epigastric discomfort 01/25/2019  . History of peptic ulcer disease 01/25/2019  . Diabetes mellitus without complication (HCC) 01/24/2016  .  Hypertension 01/24/2016  . Hyperlipidemia 01/24/2016   Past Medical History:  Diagnosis Date  . Diabetes mellitus without complication (HCC)   . Hyperlipidemia   . Hypertension     Family History  Problem Relation Age of Onset  . Diabetes type II Mother   . Heart attack Mother   . Liver cancer Father   . Heart attack Brother     No past surgical history on file. Social History   Occupational History  . Occupation: Nature conservation officer  Tobacco Use  . Smoking status: Former Smoker    Quit date: 11/21/2012    Years since quitting: 8.3  . Smokeless tobacco: Never Used  . Tobacco comment: social smoker  Vaping Use  . Vaping Use: Never used  Substance and Sexual Activity  . Alcohol  use: No    Alcohol/week: 0.0 standard drinks  . Drug use: No  . Sexual activity: Yes

## 2021-04-05 ENCOUNTER — Telehealth: Payer: Self-pay | Admitting: Family Medicine

## 2021-04-05 LAB — HEPATIC FUNCTION PANEL
AG Ratio: 1.4 (calc) (ref 1.0–2.5)
ALT: 22 U/L (ref 6–29)
AST: 19 U/L (ref 10–35)
Albumin: 4.4 g/dL (ref 3.6–5.1)
Alkaline phosphatase (APISO): 55 U/L (ref 37–153)
Bilirubin, Direct: 0.1 mg/dL (ref 0.0–0.2)
Globulin: 3.2 g/dL (calc) (ref 1.9–3.7)
Indirect Bilirubin: 0.3 mg/dL (calc) (ref 0.2–1.2)
Total Bilirubin: 0.4 mg/dL (ref 0.2–1.2)
Total Protein: 7.6 g/dL (ref 6.1–8.1)

## 2021-04-05 LAB — AFP TUMOR MARKER: AFP-Tumor Marker: 2.9 ng/mL

## 2021-04-05 NOTE — Telephone Encounter (Signed)
Liver panel and AFP marker are all normal.  Will await MRI results.

## 2021-04-05 NOTE — Telephone Encounter (Signed)
LMOM of the results

## 2021-04-18 ENCOUNTER — Ambulatory Visit
Admission: RE | Admit: 2021-04-18 | Discharge: 2021-04-18 | Disposition: A | Discharge status: Home / Self Care | Payer: Medicare Other | Source: Ambulatory Visit | Attending: Family Medicine | Admitting: Family Medicine

## 2021-04-18 DIAGNOSIS — R109 Unspecified abdominal pain: Secondary | ICD-10-CM | POA: Diagnosis not present

## 2021-04-18 DIAGNOSIS — R1011 Right upper quadrant pain: Secondary | ICD-10-CM

## 2021-04-18 DIAGNOSIS — Z9049 Acquired absence of other specified parts of digestive tract: Secondary | ICD-10-CM | POA: Diagnosis not present

## 2021-04-18 DIAGNOSIS — M4802 Spinal stenosis, cervical region: Secondary | ICD-10-CM | POA: Diagnosis not present

## 2021-04-18 DIAGNOSIS — G8929 Other chronic pain: Secondary | ICD-10-CM

## 2021-04-18 DIAGNOSIS — M545 Low back pain, unspecified: Secondary | ICD-10-CM | POA: Diagnosis not present

## 2021-04-18 DIAGNOSIS — K838 Other specified diseases of biliary tract: Secondary | ICD-10-CM | POA: Diagnosis not present

## 2021-04-18 DIAGNOSIS — K760 Fatty (change of) liver, not elsewhere classified: Secondary | ICD-10-CM | POA: Diagnosis not present

## 2021-04-18 DIAGNOSIS — M542 Cervicalgia: Secondary | ICD-10-CM

## 2021-04-18 IMAGING — MR MR LUMBAR SPINE W/O CM
4 of 5 series · 19 of 48 positions shown · non-contrast
Comparison: [DATE]

CLINICAL DATA: Chronic and persistent low back pain.  Leg pain

EXAM:
MRI LUMBAR SPINE WITHOUT CONTRAST
TECHNIQUE: Multiplanar, multisequence MR imaging of the lumbar spine was
performed. No intravenous contrast was administered.

[Series 5: T2 · sagittal · 4.0mm · 0.88mm/px · 6 of 12 slices shown (1 of 2)]
[im 1/12]
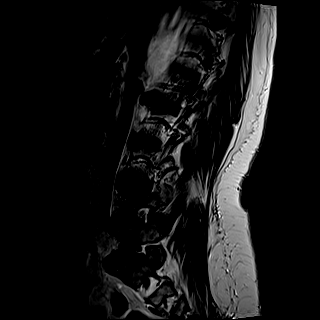
[im 3/12]
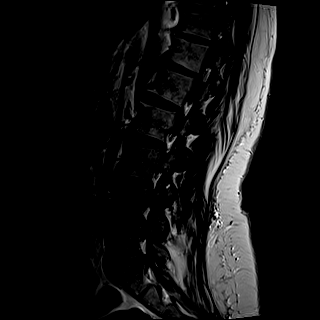
[im 5/12]
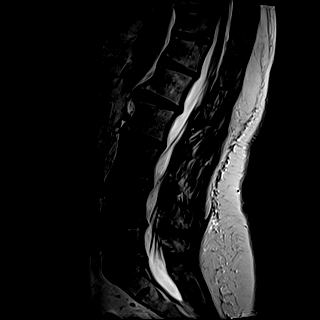
[im 7/12]
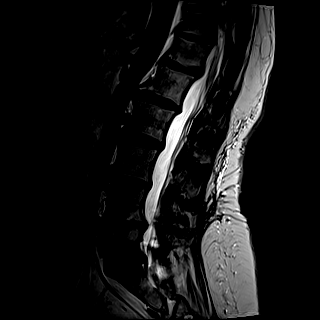
[im 9/12]
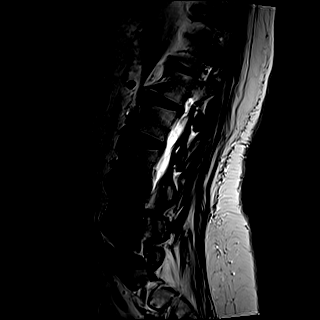
[im 12/12]
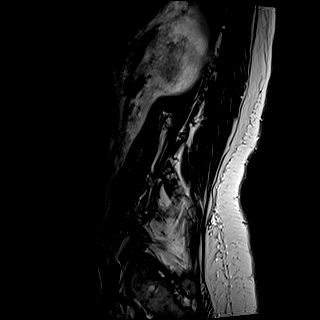

[Series 6: T1 · sagittal · 4.0mm · 0.73mm/px · 3 of 12 slices shown (1 of 2)]
[im 1/12]
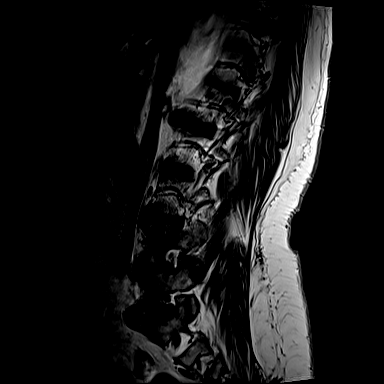
[im 6/12]
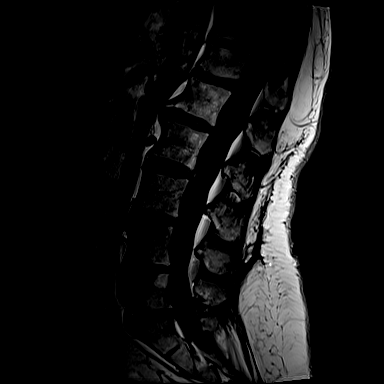
[im 12/12]
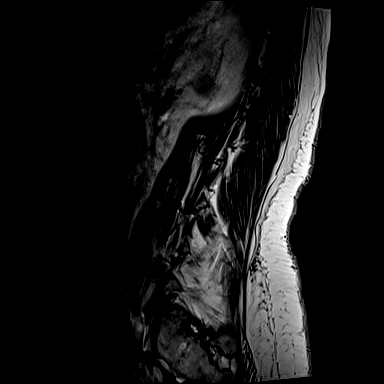

[Series 10: T2 · axial · 4.0mm · 0.28mm/px · z∈[-132,+45]mm · 7 of 39 slices shown (2 of 2)]
[im 3/39]
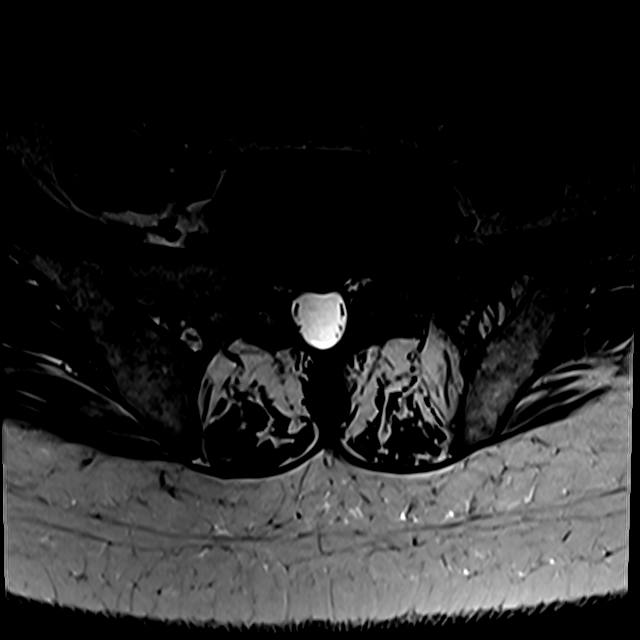
[im 6/39]
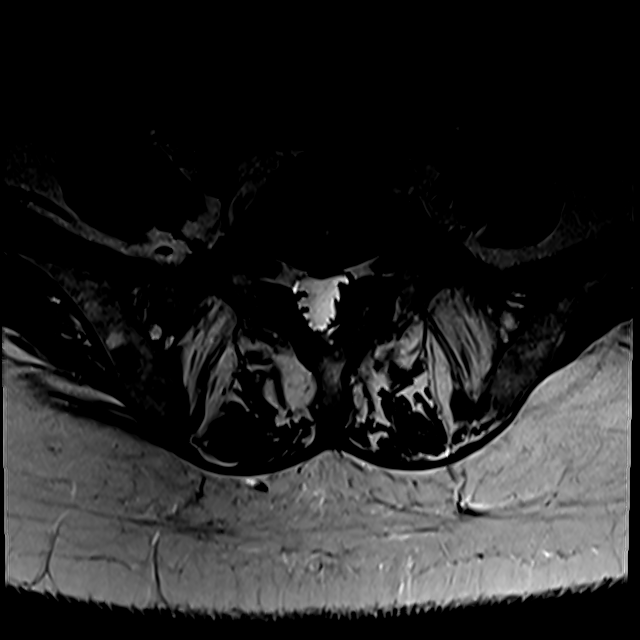
[im 8/39]
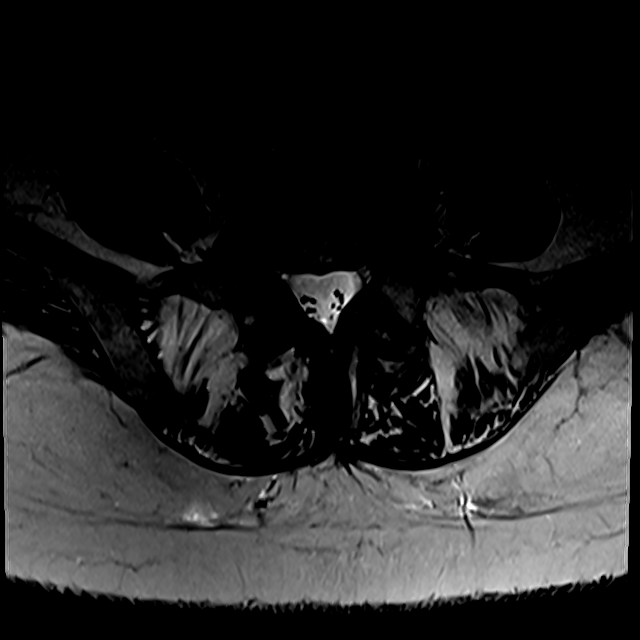
[im 13/39]
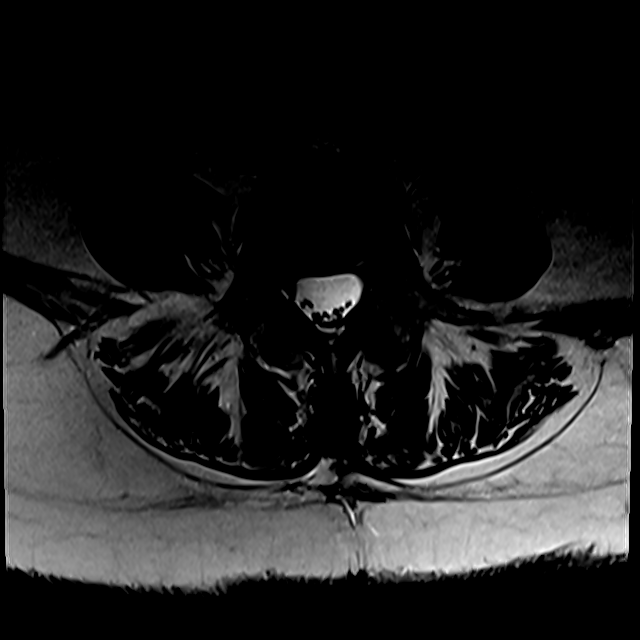
[im 18/39]
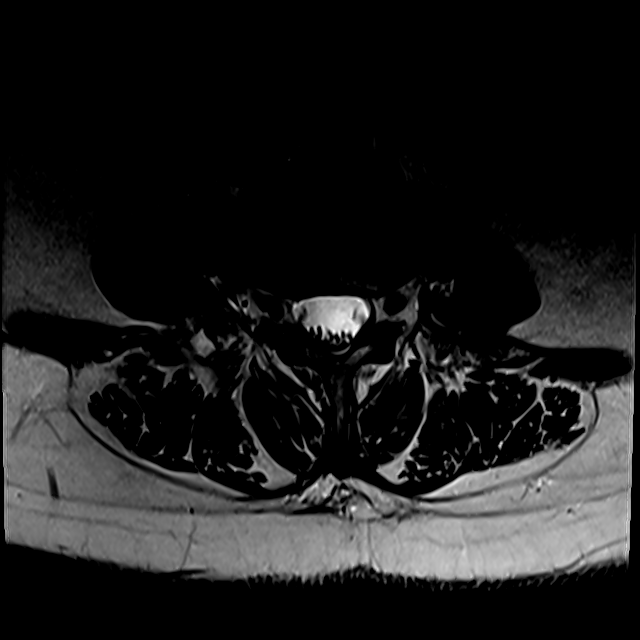
[im 21/39]
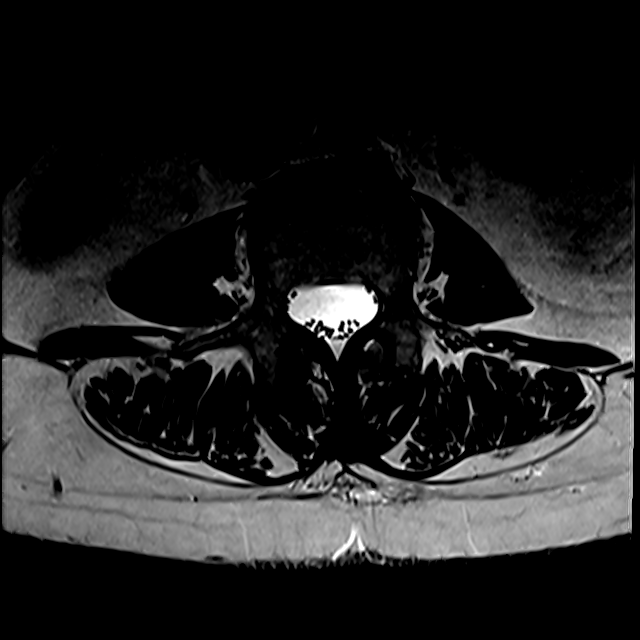
[im 33/39]
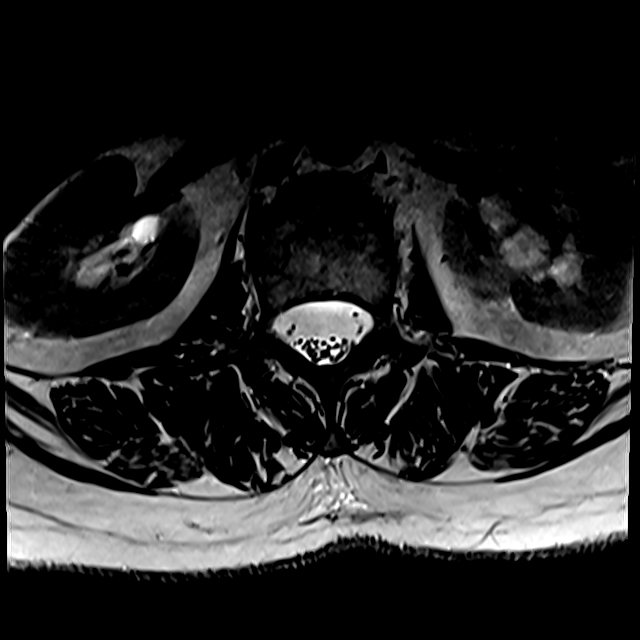

[Series 13: T1 · axial · 4.0mm · 0.28mm/px · z∈[-117,+45]mm · 3 of 39 slices shown (2 of 2)]
[im 6/39]
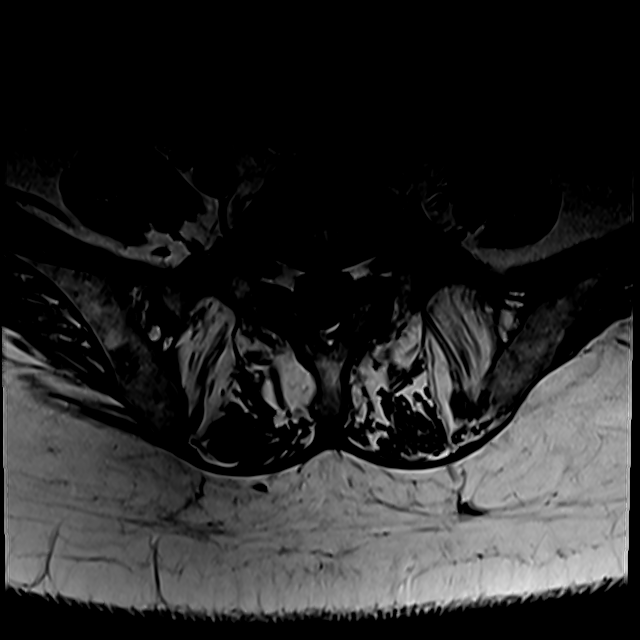
[im 21/39]
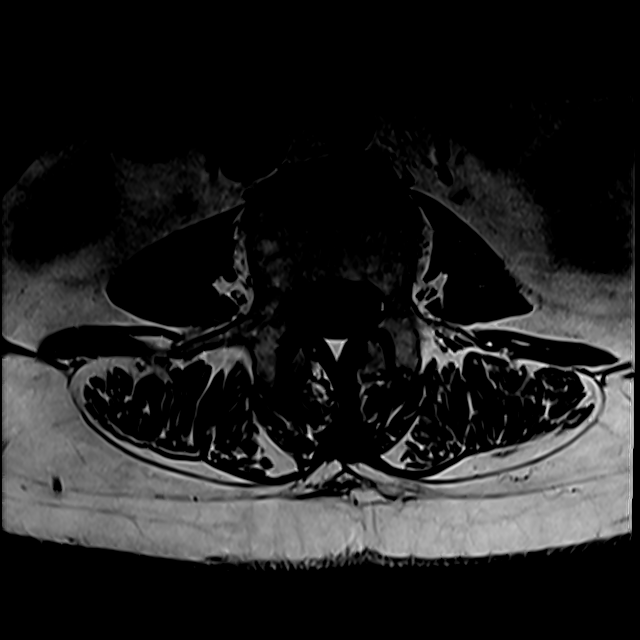
[im 33/39]
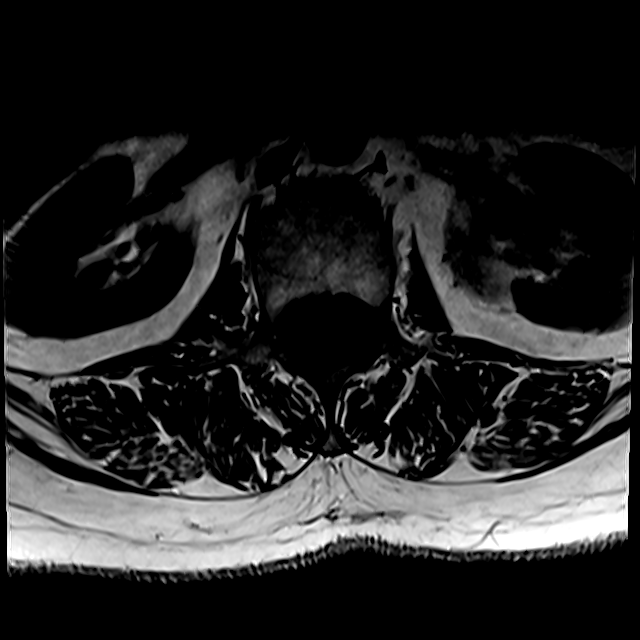

[19 of 48 positions shown; findings below may reference images not displayed]

FINDINGS: Segmentation:  5 lumbar type vertebrae

Alignment:  Unremarkable

Vertebrae:  No fracture, evidence of discitis, or bone lesion.

Conus medullaris and cauda equina: Conus extends to the L1 level.
Conus and cauda equina appear normal.

Paraspinal and other soft tissues: Renal scarring at L4-5.

Disc levels:

T10-11: Disc narrowing with central protrusion contacting but not
compressing the cord.

T12- L1: Small left paracentral protrusion.  No neural compression

L1-L2: Disc narrowing and bulging.

L2-L3: Disc narrowing and bulging.

L3-L4: Disc narrowing and bulging.

L4-L5: Disc narrowing and bulging with endplate and facet spurring.
Degeneration has progressed from prior. There has been interval
decompression of a previously noted extrusion without recurrence.
There is mild triangular narrowing of the thecal sac. Moderate left
foraminal narrowing mainly from disc height loss and facet spur.

L5-S1:Disc narrowing and bulging with far-lateral osteophytes
contacting but not clearly compressing the L5 nerve roots. Mild
subarticular recess narrowing on both sides.
IMPRESSION: 1. Lumbar spine degeneration with chronic moderate foraminal
narrowing on the left at L4-5.
2. No recurrence of the disc extrusion noted in [SU].

## 2021-04-18 IMAGING — MR MR CERVICAL SPINE W/O CM
5 series · 38 of 48 positions shown · non-contrast
Comparison: None.

CLINICAL DATA: Chronic neck pain.

EXAM:
MRI CERVICAL SPINE WITHOUT CONTRAST
TECHNIQUE: Multiplanar, multisequence MR imaging of the cervical spine was
performed. No intravenous contrast was administered.

[Series 5: T2 · sagittal · 3.0mm · 0.82mm/px · 7 of 12 slices shown (1 of 2)]
[im 1/12]
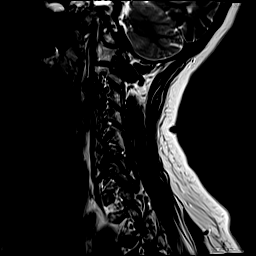
[im 2/12]
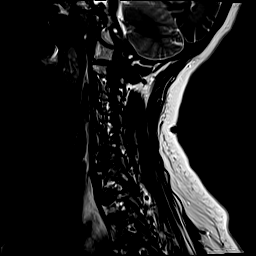
[im 4/12]
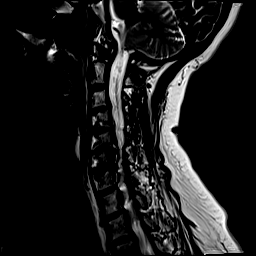
[im 6/12]
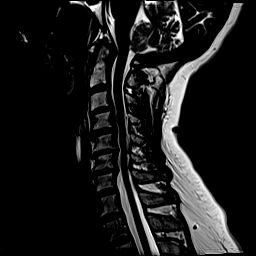
[im 8/12]
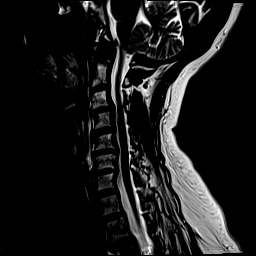
[im 10/12]
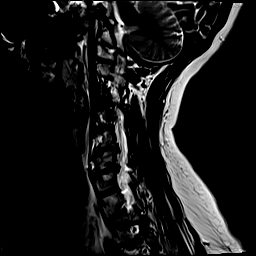
[im 12/12]
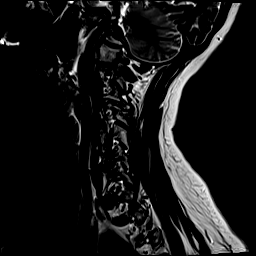

[Series 6: T1 · sagittal · 3.0mm · 0.82mm/px · 7 of 12 slices shown]
[im 1/12]
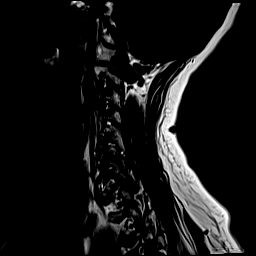
[im 2/12]
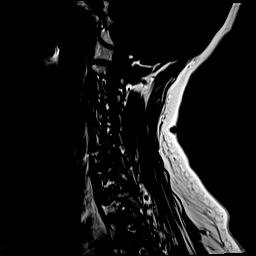
[im 4/12]
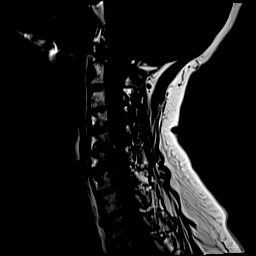
[im 6/12]
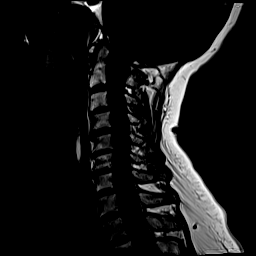
[im 8/12]
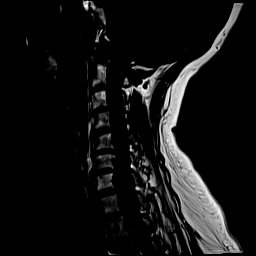
[im 10/12]
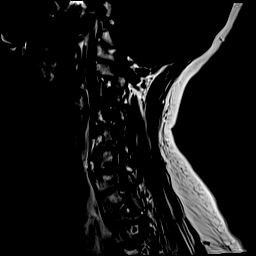
[im 12/12]
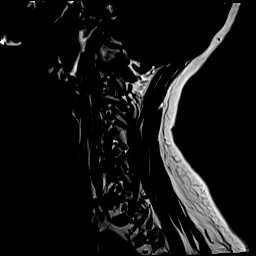

[Series 7: STIR · sagittal · 3.0mm · 0.41mm/px · 6 of 12 slices shown]
[im 1/12]
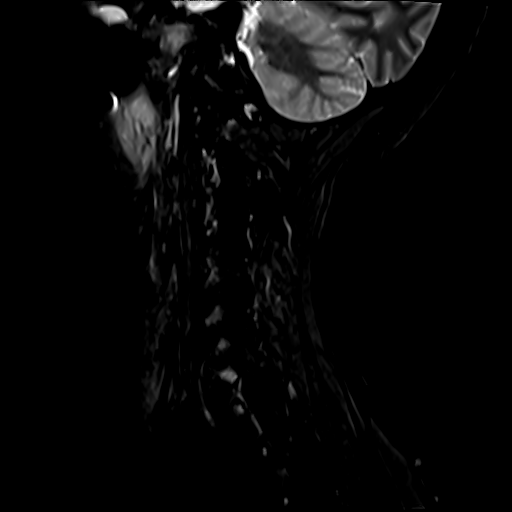
[im 3/12]
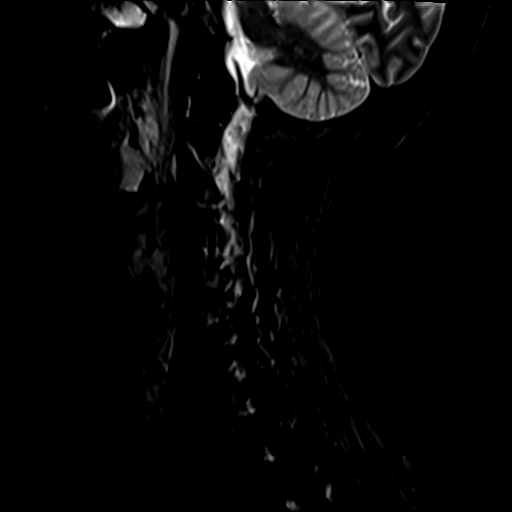
[im 5/12]
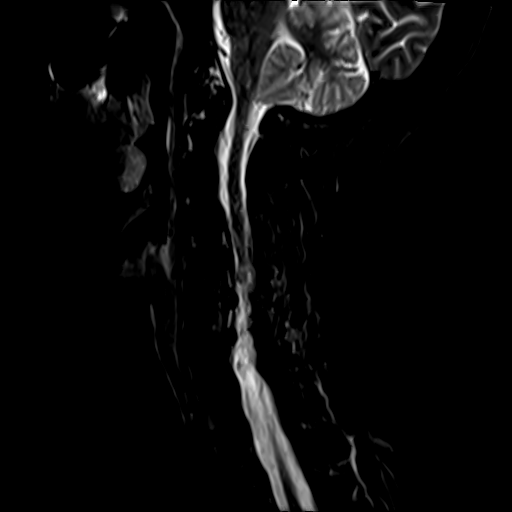
[im 7/12]
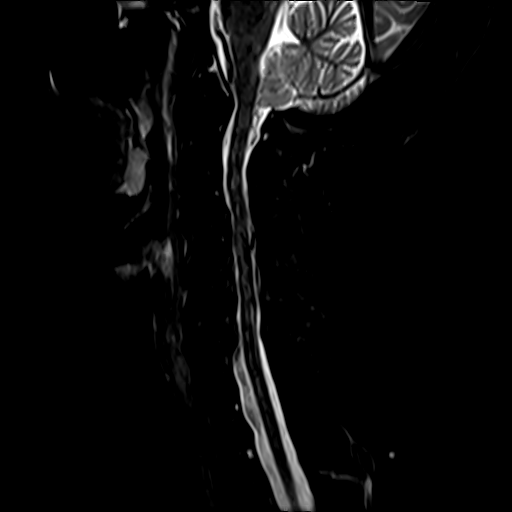
[im 9/12]
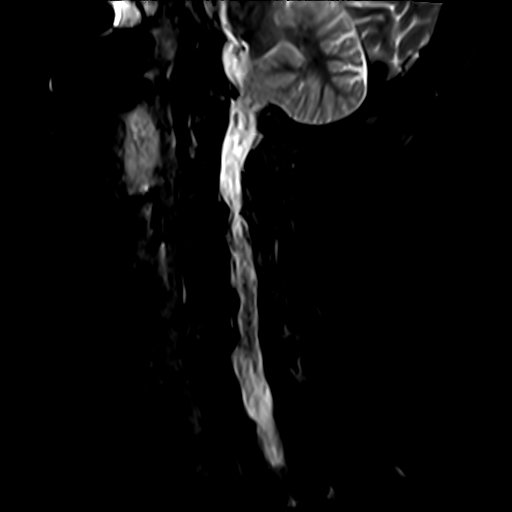
[im 12/12]
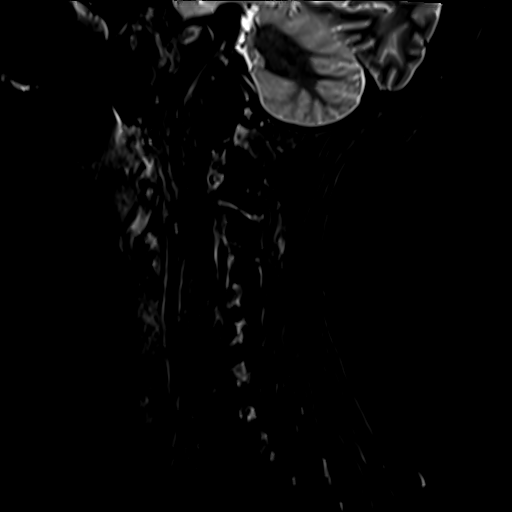

[Series 8: T2 · axial · 3.0mm · 0.50mm/px · z∈[-48,+51]mm · 10 of 27 slices shown (2 of 2)]
[im 1/27]
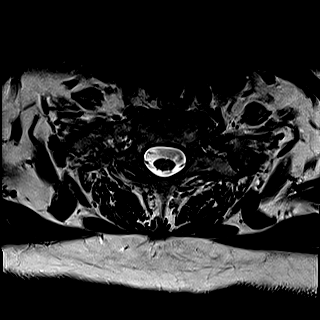
[im 3/27]
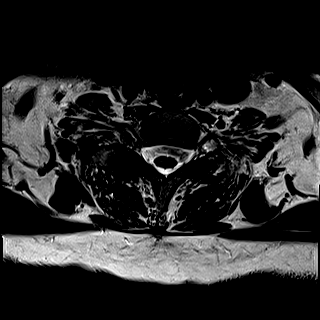
[im 5/27]
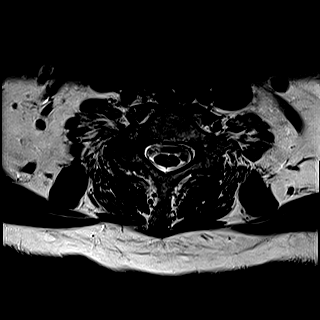
[im 7/27]
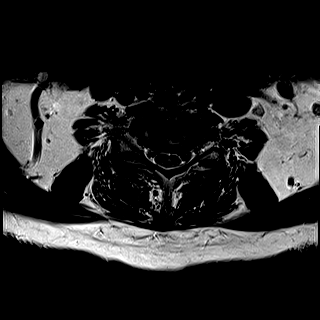
[im 9/27]
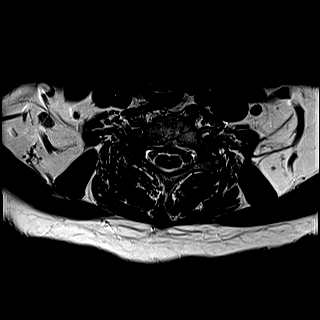
[im 13/27]
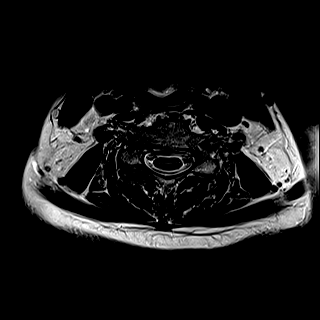
[im 15/27]
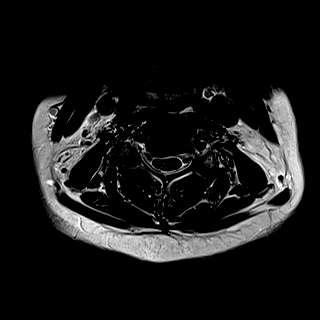
[im 19/27]
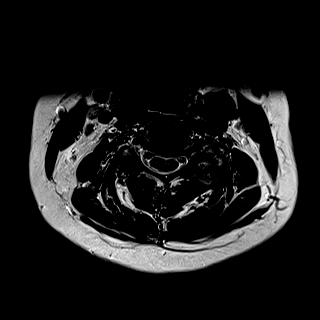
[im 23/27]
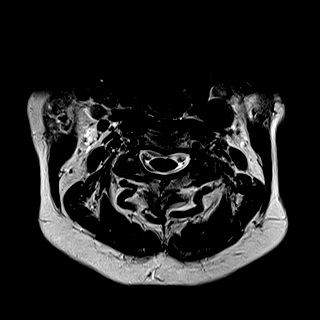
[im 27/27]
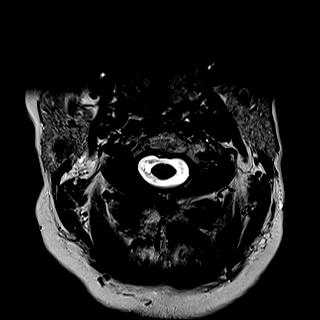

[Series 9: GRE · axial · 3.0mm · 0.42mm/px · z∈[-48,+51]mm · 8 of 27 slices shown]
[im 1/27]
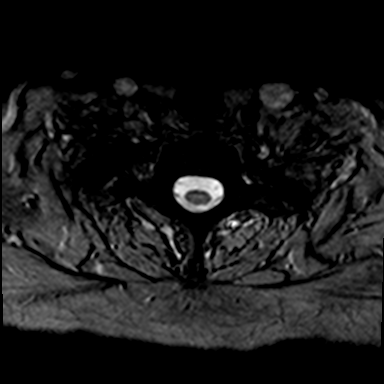
[im 5/27]
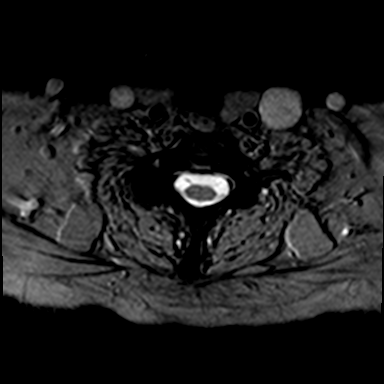
[im 9/27]
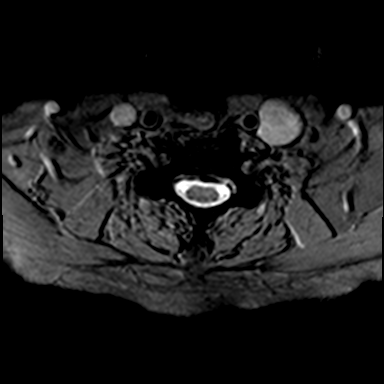
[im 13/27]
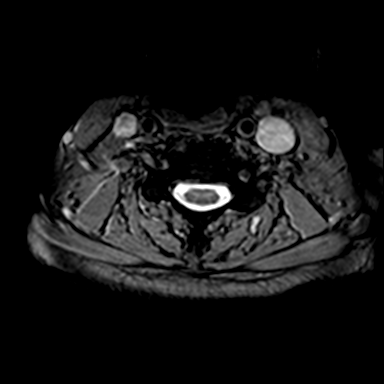
[im 15/27]
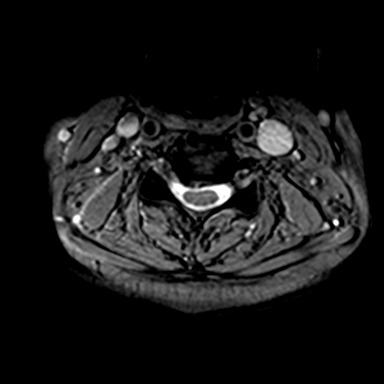
[im 19/27]
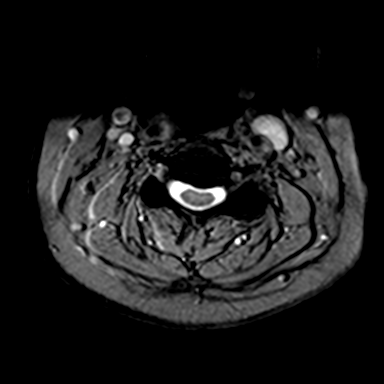
[im 23/27]
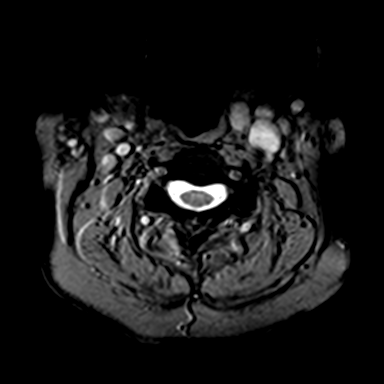
[im 27/27]
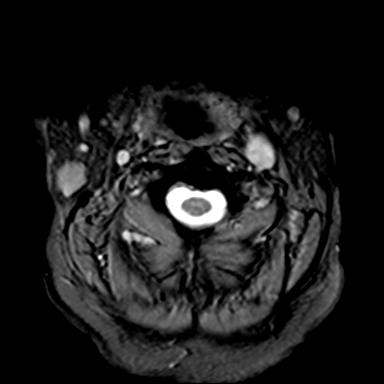

[38 of 48 positions shown; findings below may reference images not displayed]

FINDINGS: Alignment: Reversal of cervical lordosis with mild C3-4
anterolisthesis

Vertebrae: No fracture, evidence of discitis, or bone lesion.

Cord: Normal signal and morphology.

Posterior Fossa, vertebral arteries, paraspinal tissues: Tonsillar
hypertrophy. 17 mm right parotid tail mass.

Disc levels:

C2-3: Unremarkable.

C3-4: Disc narrowing with uncovertebral and facet spurring
asymmetric to the left where there is foraminal impingement.

C4-5: Disc narrowing and bulging with small right paracentral
protrusion. No neural compression

C5-6: Disc narrowing and bulging with uncovertebral spurring. Right
foraminal impingement based on gradient images. Mild spinal stenosis

C6-7: Disc narrowing and bulging with endplate and uncovertebral
ridging. Mild bilateral foraminal narrowing based on gradient
images, greater on the right

C7-T1:Unremarkable.
IMPRESSION: 1. Disc and facet degeneration at C3-4 to C6-7 with mild C3-4
anterolisthesis.
2. Focal advanced left foraminal impingement at C3-4.
3. Right foraminal impingement at C5-6.
4. Incidental 17 mm right parotid tail mass and tonsillar
thickening, recommend ENT referral.

## 2021-04-18 IMAGING — MR MR ABDOMEN WO/W CM
19 series · 48 of 48 positions shown · IV contrast (multihance)
Comparison: No priors.

CLINICAL DATA: 70-year-old female with history of abdominal pain.

EXAM:
MRI ABDOMEN WITHOUT AND WITH CONTRAST
TECHNIQUE: Multiplanar multisequence MR imaging of the abdomen was performed
both before and after the administration of intravenous contrast.
CONTRAST:  17mL MULTIHANCE GADOBENATE DIMEGLUMINE 529 MG/ML IV SOLN

[Series 4: T2 · axial · 5.0mm · 1.48mm/px · 1 of 38 slices shown (1 of 5)]
[im 1/38]
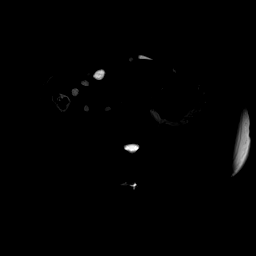

[Series 5: DWI · axial · 5.5mm · 1.42mm/px · z∈[-122,+109]mm · 4 of 108 slices shown (1 of 2)]
[im 1/108]
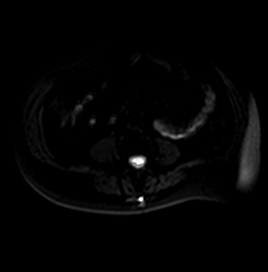
[im 36/108]
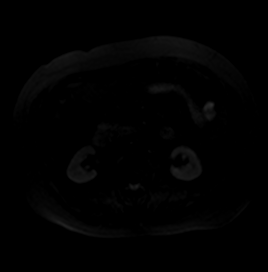
[im 72/108]
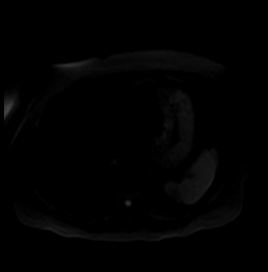
[im 108/108]
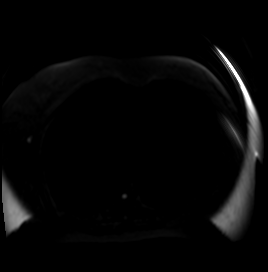

[Series 6: DWI · axial · 5.5mm · 1.42mm/px · 1 of 36 slices shown (2 of 2)]
[im 1/36]
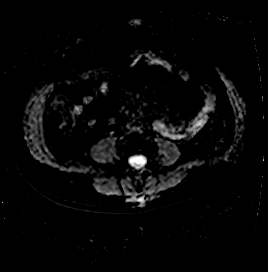

[Series 7: T2 · 1 of 5 slices shown (2 of 5)]
[im 1/5]
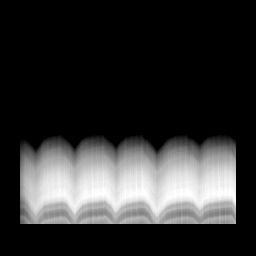

[Series 8: T2 · coronal · 5.0mm · 1.56mm/px · 1 of 36 slices shown (3 of 5)]
[im 1/36]
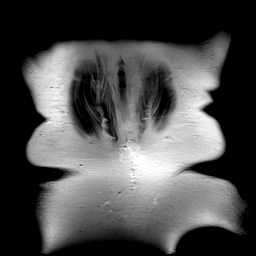

[Series 9: T2 · axial · 6.5mm · 1.19mm/px · 1 of 30 slices shown (4 of 5)]
[im 1/30]
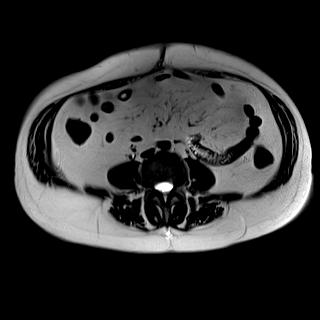

[Series 10: T2 · 1 of 4 slices shown (5 of 5)]
[im 1/4]
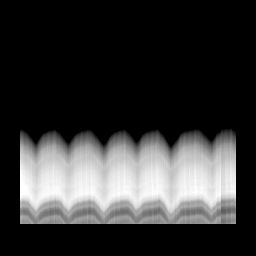

[Series 11: T1 · axial · 3.0mm · 1.19mm/px · z∈[-109,+104]mm · 6 of 144 slices shown]
[im 1/144]
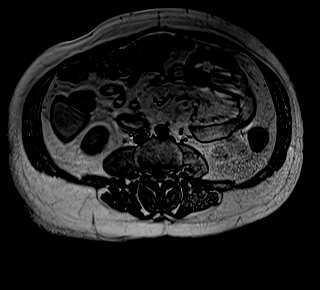
[im 29/144]
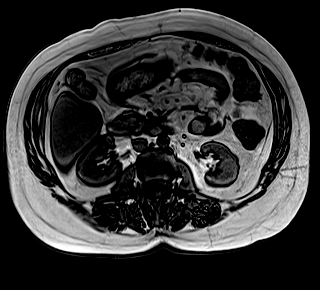
[im 58/144]
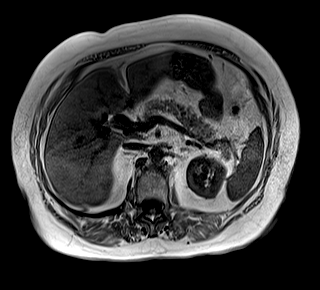
[im 86/144]
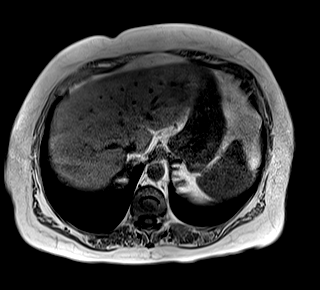
[im 115/144]
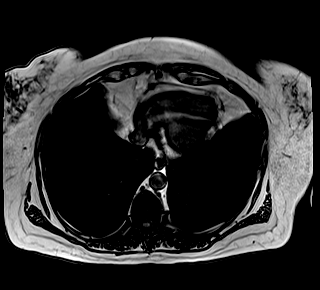
[im 144/144]
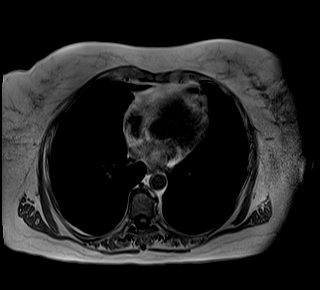

[Series 12: bSSFP · axial · 5.5mm · 1.25mm/px · z∈[-125,+120]mm · 2 of 38 slices shown]
[im 1/38]
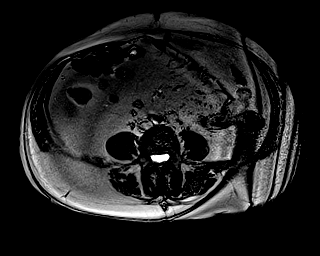
[im 38/38]
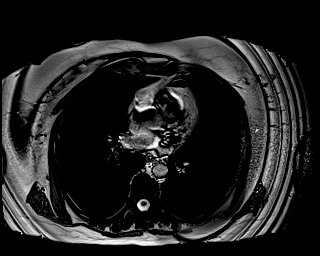

[Series 13: T1 dynamic · axial · non-contrast · 3.0mm · 1.25mm/px · z∈[-109,+104]mm · 3 of 72 slices shown]
[im 1/72]
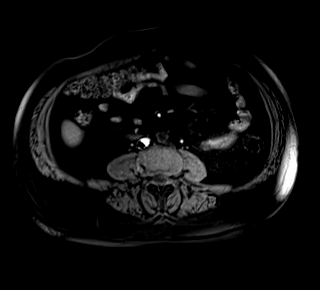
[im 36/72]
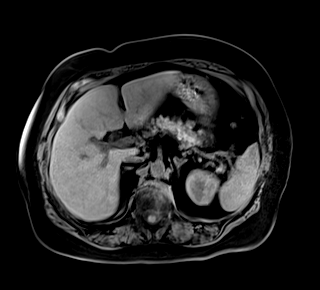
[im 72/72]
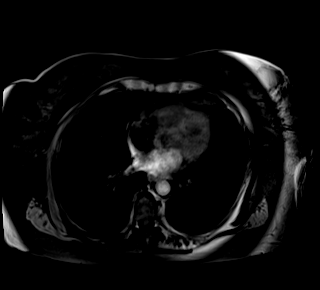

[Series 14: T1 dynamic post-contrast · axial · 3.0mm · 1.25mm/px · z∈[-109,+104]mm · 3 of 72 slices shown (1 of 9)]
[im 1/72]
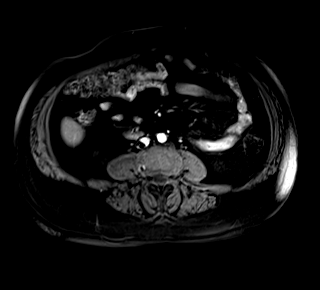
[im 36/72]
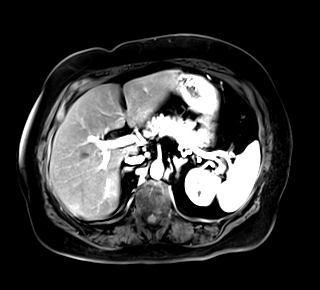
[im 72/72]
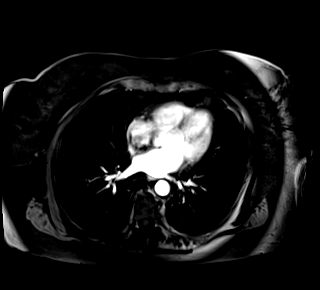

[Series 15: T1 dynamic post-contrast · axial · 3.0mm · 1.25mm/px · z∈[-109,+104]mm · 3 of 72 slices shown (2 of 9)]
[im 1/72]
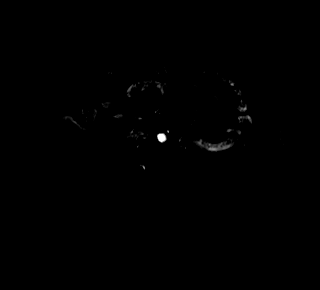
[im 36/72]
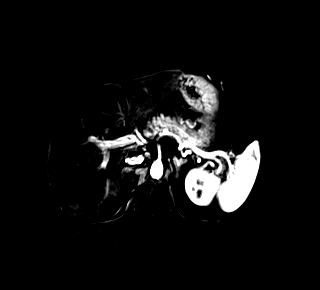
[im 72/72]
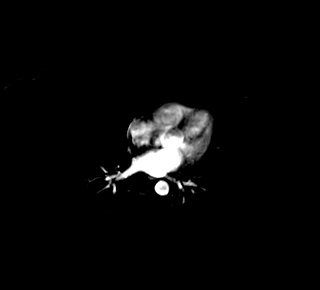

[Series 16: T1 dynamic post-contrast · axial · 3.0mm · 1.25mm/px · z∈[-109,+104]mm · 3 of 72 slices shown (3 of 9)]
[im 1/72]
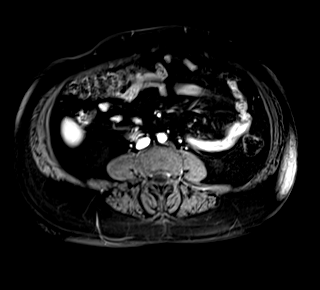
[im 36/72]
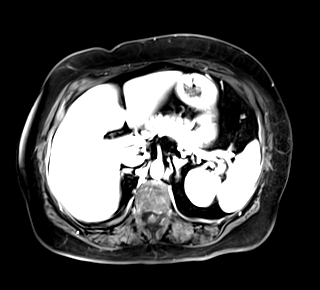
[im 72/72]
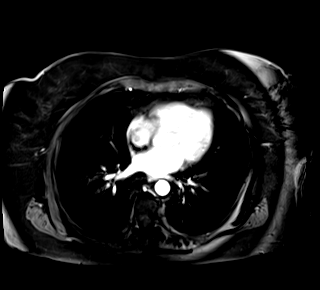

[Series 17: T1 dynamic post-contrast · axial · 3.0mm · 1.25mm/px · z∈[-109,+104]mm · 3 of 72 slices shown (4 of 9)]
[im 1/72]
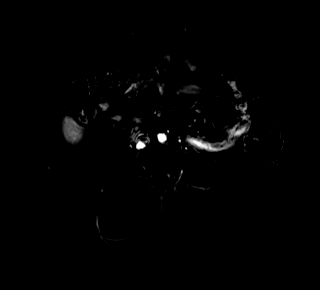
[im 36/72]
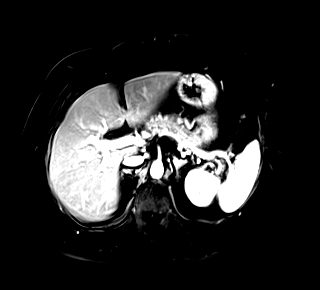
[im 72/72]
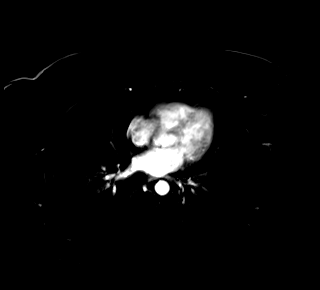

[Series 18: T1 dynamic post-contrast · axial · 3.0mm · 1.25mm/px · z∈[-109,+104]mm · 3 of 72 slices shown (5 of 9)]
[im 1/72]
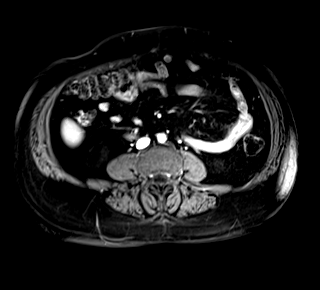
[im 36/72]
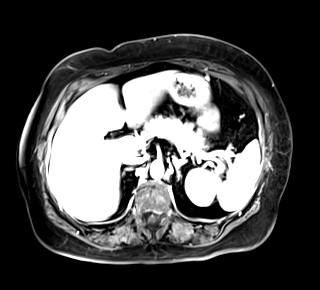
[im 72/72]
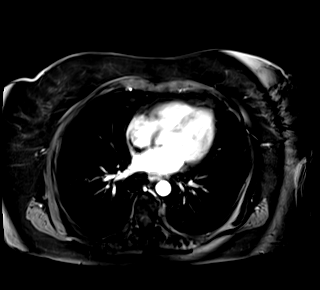

[Series 19: T1 dynamic post-contrast · axial · 3.0mm · 1.25mm/px · z∈[-109,+104]mm · 3 of 72 slices shown (6 of 9)]
[im 1/72]
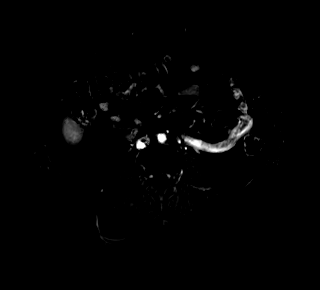
[im 36/72]
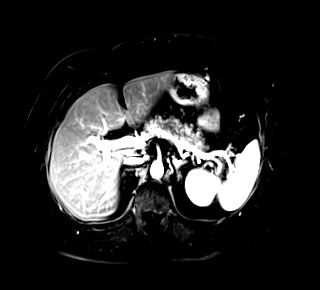
[im 72/72]
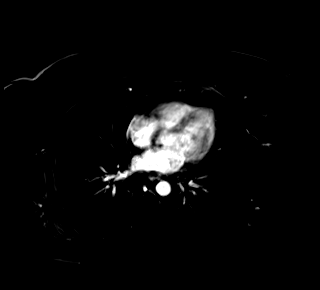

[Series 20: T1 dynamic post-contrast · coronal · 3.0mm · 1.25mm/px · 3 of 72 slices shown (7 of 9)]
[im 1/72]
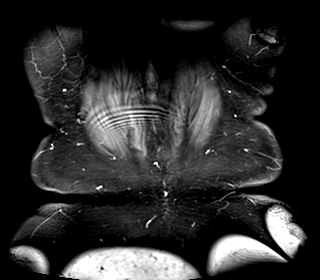
[im 36/72]
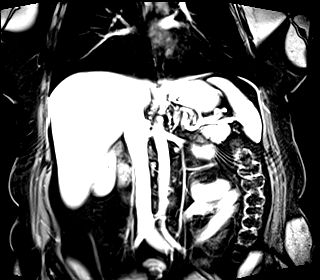
[im 72/72]
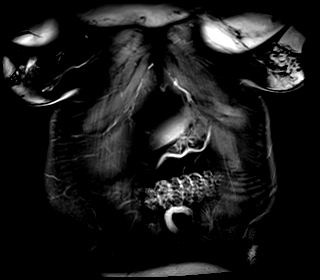

[Series 21: T1 dynamic post-contrast · axial · 3.0mm · 1.25mm/px · z∈[-109,+104]mm · 3 of 72 slices shown (8 of 9)]
[im 1/72]
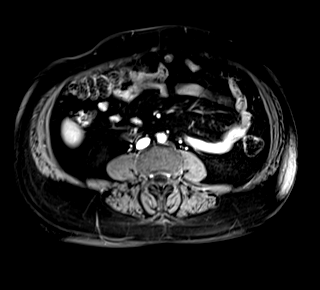
[im 36/72]
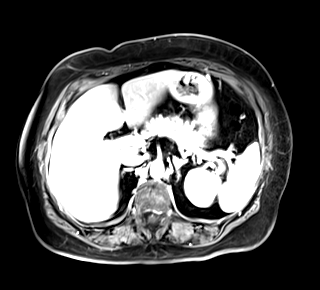
[im 72/72]
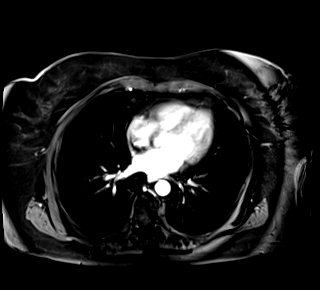

[Series 22: T1 dynamic post-contrast · axial · 3.0mm · 1.25mm/px · z∈[-109,+104]mm · 3 of 72 slices shown (9 of 9)]
[im 1/72]
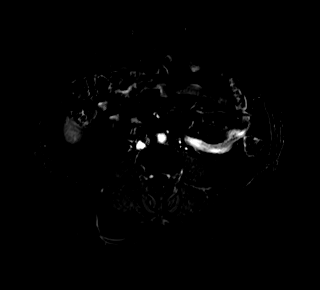
[im 36/72]
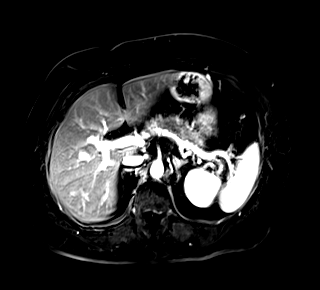
[im 72/72]
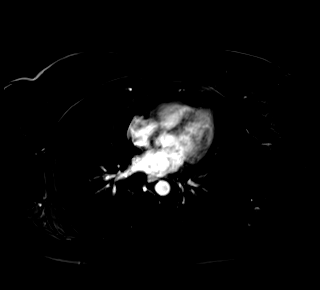

[48 of 48 positions shown; findings below may reference images not displayed]

FINDINGS: Lower chest: Unremarkable.

Hepatobiliary: Diffuse loss of signal intensity throughout the
hepatic parenchyma on out of phase dual echo images, indicative of a
background of hepatic steatosis. No discrete cystic or solid hepatic
lesions. No intra hepatic biliary ductal dilatation. Status post
cholecystectomy. Common bile duct measures up to 16 mm in the porta
hepatis, likely reflective of benign post cholecystectomy
physiology. No filling defect within the common bile duct to suggest
choledocholithiasis.

Pancreas: No pancreatic mass. No pancreatic ductal dilatation. No
pancreatic or peripancreatic fluid collections or inflammatory
changes.

Spleen:  Unremarkable.

Adrenals/Urinary Tract: Bilateral kidneys and adrenal glands are
normal in appearance. No hydroureteronephrosis in the visualized
portions of the abdomen.

Stomach/Bowel: Visualized portions are unremarkable.

Vascular/Lymphatic: No aneurysm identified in the visualized
abdominal vasculature. No lymphadenopathy noted in the abdomen.

Other: No significant volume of ascites noted in the visualized
portions of the peritoneal cavity.

Musculoskeletal: No aggressive appearing osseous lesions are noted
in the visualized portions of the skeleton.
IMPRESSION: 1. No acute findings are noted in the abdomen to account for the
patient's symptoms.
2. Hepatic steatosis.
3. There is moderate dilatation of the common bile duct. However,
there is no intrahepatic biliary ductal dilatation. Findings are
favored to reflect benign post cholecystectomy physiology. No
evidence of retained choledocholithiasis or other cause of
obstruction, and given the lack of intrahepatic biliary ductal
dilatation, obstruction is not favored. However, correlation with
liver function tests is recommended.

## 2021-04-18 MED ORDER — GADOBENATE DIMEGLUMINE 529 MG/ML IV SOLN
17.0000 mL | Freq: Once | INTRAVENOUS | Status: AC | PRN
  Administered 2021-04-18: 17 mL via INTRAVENOUS

## 2021-04-20 ENCOUNTER — Telehealth: Payer: Self-pay | Admitting: Family Medicine

## 2021-04-20 DIAGNOSIS — K118 Other diseases of salivary glands: Secondary | ICD-10-CM

## 2021-04-20 NOTE — Telephone Encounter (Signed)
Liver MRI scan shows no sign of cancer.  There is evidence of fatty liver disease, but fortunately liver blood tests were normal.  It is important to exercise regularly and to limit dietary intake of processed carbohydrates and sweets.  Lumbar MRI scan shows no disc herniation.  There is narrowing of the nerve opening on the left at L4-5 related to bone spurring.  No indication for surgery at this point, but for pain relief could consider physical therapy, or possibly injection.  Neck MRI scan shows degeneration of multiple disks along with arthritis in multiple facet joints.  There is narrowing of the nerve opening on the left at C3-4 and on the right at C5-6.  This could cause nerve impingement at either of these levels.  Treatment options would include physical therapy, injection.  If symptoms do not improve, could contemplate surgery but would recommend conservative treatment prior to that.  On the neck MRI scan there is a mass in the right parotid gland/salivary gland, and thickening of the tonsil.  Radiologist recommends ENT referral so I will request that.

## 2021-04-22 NOTE — Telephone Encounter (Signed)
Called and left voice mail on mobile to call me back for results.

## 2021-04-22 NOTE — Telephone Encounter (Signed)
I spoke with the patient about her results and treatment options. She is not interested in ESIs - was more receptive to the idea of trying PT. She has an appointment with Dr. Suszanne Conners (ENT) for the parotid mass on 04/29/21 at 1:30. The patient wanted to come in to see Dr. Prince Rome before that ENT appointment, to further discuss the MRI findings -- appointment scheduled for 04/27/21 at 3:20.

## 2021-04-27 ENCOUNTER — Ambulatory Visit (INDEPENDENT_AMBULATORY_CARE_PROVIDER_SITE_OTHER): Payer: Medicare Other | Admitting: Family Medicine

## 2021-04-27 ENCOUNTER — Other Ambulatory Visit: Payer: Self-pay

## 2021-04-27 DIAGNOSIS — M79642 Pain in left hand: Secondary | ICD-10-CM

## 2021-04-27 DIAGNOSIS — M79641 Pain in right hand: Secondary | ICD-10-CM | POA: Diagnosis not present

## 2021-04-27 DIAGNOSIS — K118 Other diseases of salivary glands: Secondary | ICD-10-CM | POA: Diagnosis not present

## 2021-04-27 DIAGNOSIS — M545 Low back pain, unspecified: Secondary | ICD-10-CM | POA: Diagnosis not present

## 2021-04-27 DIAGNOSIS — M542 Cervicalgia: Secondary | ICD-10-CM | POA: Diagnosis not present

## 2021-04-27 DIAGNOSIS — G8929 Other chronic pain: Secondary | ICD-10-CM

## 2021-04-27 NOTE — Progress Notes (Signed)
   Office Visit Note   Patient: Morgan Ruiz           Date of Birth: June 02, 1951           MRN: [id number] Visit Date: 04/27/2021 Requested by: Lavada Mesi, MD [address] [place],  [place] [address] PCP: Lavada Mesi, MD  Subjective: Chief Complaint  Patient presents with  . Other    Follow up post MRIs on liver, neck and lsp. Will be seeing Dr. Suszanne Conners this Thursday for the parotid mass.     HPI: She is here to discuss MRI results.  Incidentally, her cat died 10 days ago.  She is very sad about this, understandably.  From a neck and back pain standpoint, her symptoms are manageable.  She is reluctant to do physical therapy since it did not help for another problem she had.  She is scheduled to see ENT for the right sided submandibular/parotid mass.  She has been having pain and stiffness in both hands in the mornings.  She wonders what can be done for that.                ROS:   All other systems were reviewed and are negative.  Objective: Vital Signs: There were no vitals taken for this visit.  Physical Exam:  General:  Alert and oriented, in no acute distress. Pulm:  Breathing unlabored. Psy:  Normal mood, congruent affect.  Hands: Carpal tunnel release scars are well-healed.  Negative Tinel's at the carpal tunnel.  No synovitis in her joints.  She seems to be tender near the second CMC joints.  Imaging: No results found.  Assessment & Plan: 1.  Cervical and lumbar spondylosis -If pain gets worse we could try a different physical therapist.  Injections would be another consideration.  2.  Right sided neck mass, scheduled to see ENT.  3.  Bilateral hand pain with possible arthritis - Glucosamine and turmeric.     Procedures: No procedures performed        PMFS History: Patient Active Problem List   Diagnosis Date Noted  . Vitamin D deficiency 04/08/2020  . Epigastric discomfort 01/25/2019  . History of peptic ulcer disease 01/25/2019  . Diabetes  mellitus without complication (HCC) 01/24/2016  . Hypertension 01/24/2016  . Hyperlipidemia 01/24/2016   Past Medical History:  Diagnosis Date  . Diabetes mellitus without complication (HCC)   . Hyperlipidemia   . Hypertension     Family History  Problem Relation Age of Onset  . Diabetes type II Mother   . Heart attack Mother   . Liver cancer Father   . Heart attack Brother     No past surgical history on file. Social History   Occupational History  . Occupation: Nature conservation officer  Tobacco Use  . Smoking status: Former Smoker    Quit date: 11/21/2012    Years since quitting: 8.4  . Smokeless tobacco: Never Used  . Tobacco comment: social smoker  Vaping Use  . Vaping Use: Never used  Substance and Sexual Activity  . Alcohol use: No    Alcohol/week: 0.0 standard drinks  . Drug use: No  . Sexual activity: Yes

## 2021-04-27 NOTE — Patient Instructions (Signed)
   Glucosamine Sulfate:  1,000 mg twice daily  Turmeric:  500 mg twice daily   

## 2021-05-26 ENCOUNTER — Other Ambulatory Visit: Payer: Self-pay | Admitting: Family Medicine

## 2021-12-28 ENCOUNTER — Encounter (HOSPITAL_BASED_OUTPATIENT_CLINIC_OR_DEPARTMENT_OTHER): Payer: Self-pay | Admitting: *Deleted

## 2021-12-28 ENCOUNTER — Other Ambulatory Visit: Payer: Self-pay

## 2021-12-28 ENCOUNTER — Emergency Department (HOSPITAL_BASED_OUTPATIENT_CLINIC_OR_DEPARTMENT_OTHER)
Admission: EM | Admit: 2021-12-28 | Discharge: 2021-12-28 | Disposition: A | Discharge status: Home / Self Care | Payer: Medicare Other | Attending: Emergency Medicine | Admitting: Emergency Medicine

## 2021-12-28 DIAGNOSIS — Z720 Tobacco use: Secondary | ICD-10-CM | POA: Diagnosis not present

## 2021-12-28 DIAGNOSIS — E119 Type 2 diabetes mellitus without complications: Secondary | ICD-10-CM | POA: Diagnosis not present

## 2021-12-28 DIAGNOSIS — Z79899 Other long term (current) drug therapy: Secondary | ICD-10-CM | POA: Insufficient documentation

## 2021-12-28 DIAGNOSIS — Z20822 Contact with and (suspected) exposure to covid-19: Secondary | ICD-10-CM | POA: Insufficient documentation

## 2021-12-28 DIAGNOSIS — J209 Acute bronchitis, unspecified: Secondary | ICD-10-CM | POA: Insufficient documentation

## 2021-12-28 DIAGNOSIS — Z7984 Long term (current) use of oral hypoglycemic drugs: Secondary | ICD-10-CM | POA: Diagnosis not present

## 2021-12-28 DIAGNOSIS — R059 Cough, unspecified: Secondary | ICD-10-CM | POA: Diagnosis present

## 2021-12-28 DIAGNOSIS — I10 Essential (primary) hypertension: Secondary | ICD-10-CM | POA: Diagnosis not present

## 2021-12-28 LAB — RESP PANEL BY RT-PCR (FLU A&B, COVID) ARPGX2
Influenza A by PCR: NEGATIVE
Influenza B by PCR: NEGATIVE
SARS Coronavirus 2 by RT PCR: NEGATIVE

## 2021-12-28 MED ORDER — ALBUTEROL SULFATE HFA 108 (90 BASE) MCG/ACT IN AERS: 1.0000 | INHALATION_SPRAY | Freq: Four times a day (QID) | RESPIRATORY_TRACT | 0 refills | Status: DC | PRN

## 2021-12-28 MED ORDER — PREDNISONE 20 MG PO TABS: 40.0000 mg | ORAL_TABLET | Freq: Every day | ORAL | 0 refills | Status: DC

## 2021-12-28 NOTE — Discharge Instructions (Addendum)
As we discussed your symptoms today are consistent with an acute bronchitis from a viral respiratory illness.  Lingering cough, congestion is secondary to inflammation at the bottom of your lungs.  Thankfully I do not see any signs of severe systemic infection such as pneumonia, and I believe that this should resolve over the next few days with some help.  I prescribed you a short course of steroids to help with inflammation, as well as an inhaler to use if you are feeling short of breath.  If your symptoms worsen or fail to improve, you develop fever, chest pain, or persistent shortness of breath I recommend that you return for further evaluation.  In addition to the medications I prescribed recommend that you use over-the-counter Mucinex to help with your congestion.

## 2021-12-28 NOTE — ED Provider Notes (Signed)
[facility] [place] [facility] Provider Note   CSN: [id number] Arrival date & time: 12/28/21  1646     History  Chief Complaint  Patient presents with   Cough    Morgan Ruiz is a 71 y.o. female with a past medical history significant for hypertension, hyperlipidemia, diabetes, tobacco use presents with 10 to 12 days of cough, congestion without chest pain, shortness of breath.  Patient does report that occasionally she goes into a coughing fit, and feels some shortness of breath but no persistent shortness of breath at rest.  Patient denies any fever or chills, nausea, vomiting, diarrhea.  Reports that she has tried some over-the-counter cough medications with minimal relief.  Patient reports that she has had recent sick contacts of her husband and some of her coworkers.   Cough     Home Medications Prior to Admission medications   Medication Sig Start Date End Date Taking? Authorizing Provider  albuterol (VENTOLIN HFA) 108 (90 Base) MCG/ACT inhaler Inhale 1-2 puffs into the lungs every 6 (six) hours as needed for shortness of breath. 12/28/21  Yes Kamira Mellette H, PA-C  predniSONE (DELTASONE) 20 MG tablet Take 2 tablets (40 mg total) by mouth daily. 12/28/21  Yes Bintou Lafata H, PA-C  Cholecalciferol (VITAMIN D3 PO) Take 250 mcg by mouth daily.    [provider]  cyclobenzaprine (FLEXERIL) 10 MG tablet Take 0.5-1 tablets (5-10 mg total) by mouth 3 (three) times daily as needed. 11/07/18   Daleen Squibb, MD  gabapentin (NEURONTIN) 100 MG capsule TAKE ONE BY MOUTH EVERY AT BEDTIME, MAY INCREASE TO 1 BY MOUTH THREE TIMES A DAY IF NEEDED 01/31/21   Hilts, Legrand Como, MD  glipiZIDE (GLUCOTROL) 5 MG tablet     [provider]  hydrochlorothiazide (HYDRODIURIL) 12.5 MG tablet TAKE ONE TABLET BY MOUTH DAILY 08/05/20   Hilts, Legrand Como, MD  olmesartan (BENICAR) 20 MG tablet TAKE ONE TABLET BY MOUTH DAILY 08/05/20   Hilts, Legrand Como, MD  omeprazole  (PRILOSEC) 20 MG capsule Take 1 capsule (20 mg total) by mouth daily. 05/06/20   Hilts, Legrand Como, MD  sucralfate (CARAFATE) 1 g tablet TAKE ONE TABLET BY MOUTH FOUR TIMES A DAY WITH MEALS AND AT BEDTIME 05/26/21   Hilts, Legrand Como, MD  traMADol (ULTRAM) 50 MG tablet Take 50-100 mg by mouth 4 (four) times daily as needed. 03/27/20   [provider]      Allergies    Acetaminophen, Hydrocodone, and Vicodin [hydrocodone-acetaminophen]    Review of Systems   Review of Systems  Respiratory:  Positive for cough.   All other systems reviewed and are negative.  Physical Exam Updated Vital Signs BP (!) 173/69 (BP Location: Right Arm)    Pulse 76    Temp 98.5 F (36.9 C) (Oral)    Resp 18    Ht 5\' 7"  (1.702 m)    Wt 84 kg    SpO2 95%    BMI 29.00 kg/m  Physical Exam Vitals and nursing note reviewed.  Constitutional:      General: She is not in acute distress.    Appearance: Normal appearance.  HENT:     Head: Normocephalic and atraumatic.     Mouth/Throat:     Comments: Oropharynx clear, tonsils 1+ bilaterally, uvula midline Eyes:     General:        Right eye: No discharge.        Left eye: No discharge.  Neck:     Comments: No cervical  adenopathy noted Cardiovascular:     Rate and Rhythm: Normal rate and regular rhythm.     Heart sounds: No murmur heard.   No friction rub. No gallop.  Pulmonary:     Effort: Pulmonary effort is normal.     Breath sounds: Normal breath sounds.     Comments: Patient is clear breath sounds throughout, I heard very minimal and expiratory wheezing on forced expiration in 1 instance, no focal consolidation throughout.  Good excursion throughout all lung fields. Abdominal:     General: Bowel sounds are normal.     Palpations: Abdomen is soft.  Musculoskeletal:     Cervical back: Neck supple.  Skin:    General: Skin is warm and dry.     Capillary Refill: Capillary refill takes less than 2 seconds.  Neurological:     Mental Status: She is alert and  oriented to person, place, and time.  Psychiatric:        Mood and Affect: Mood normal.        Behavior: Behavior normal.    ED Results / Procedures / Treatments   Labs (all labs ordered are listed, but only abnormal results are displayed) Labs Reviewed  RESP PANEL BY RT-PCR (FLU A&B, COVID) ARPGX2    EKG None  Radiology No results found.  Procedures Procedures    Medications Ordered in ED Medications - No data to display  ED Course/ Medical Decision Making/ A&P                           Medical Decision Making  This is an overall well-appearing patient who presents with cough, congestion, coughing fits, fatigue for the last 10 to 12 days.  Patient reports that she had sick contact of her husband, coworkers.  Patient does have significant comorbidities of hypertension, hyperlipidemia, diabetes, tobacco abuse.  I ordered and reviewed respiratory virus panel which is negative for COVID, flu.  Performed shared decision-making with patient, discussed that in general believe that advanced imaging of the chest, or blood work is necessary at this time as patient is afebrile, her symptoms are consistent with a lingering upper respiratory infection versus acute bronchitis.  She is in no respiratory distress, has stable oxygen saturation, and is overall well-appearing despite occasional dry cough.  We will discharge with burst steroids, albuterol inhaler for feelings of shortness of breath, encourage over-the-counter Mucinex, and other symptomatic control.  Encourage plenty of fluids, ibuprofen, Tylenol as needed for body aches.  Encourage follow-up with PCP, return if symptoms worsen or fail to improve, especially if patient develops fever, chills.  Patient understands and agrees to plan, discharged in stable condition at this time. Final Clinical Impression(s) / ED Diagnoses Final diagnoses:  Acute bronchitis, unspecified organism    Rx / DC Orders ED Discharge Orders           Ordered    predniSONE (DELTASONE) 20 MG tablet  Daily        12/28/21 1800    albuterol (VENTOLIN HFA) 108 (90 Base) MCG/ACT inhaler  Every 6 hours PRN        12/28/21 1800              Galdino Hinchman, Pingree H, PA-C 12/28/21 1804    Hayden Rasmussen, MD 12/29/21 [phone]

## 2021-12-28 NOTE — ED Triage Notes (Signed)
Cough and congestion x 10 days. She works with sick co workers.

## 2022-06-14 ENCOUNTER — Ambulatory Visit (INDEPENDENT_AMBULATORY_CARE_PROVIDER_SITE_OTHER): Payer: Medicare Other | Admitting: Family

## 2022-06-14 ENCOUNTER — Encounter: Payer: Self-pay | Admitting: Family

## 2022-06-14 VITALS — BP 132/74 | HR 71 | Temp 98.0°F | Resp 16 | Ht 67.0 in | Wt 187.0 lb

## 2022-06-14 DIAGNOSIS — E785 Hyperlipidemia, unspecified: Secondary | ICD-10-CM

## 2022-06-14 DIAGNOSIS — E559 Vitamin D deficiency, unspecified: Secondary | ICD-10-CM

## 2022-06-14 DIAGNOSIS — E119 Type 2 diabetes mellitus without complications: Secondary | ICD-10-CM

## 2022-06-14 DIAGNOSIS — K219 Gastro-esophageal reflux disease without esophagitis: Secondary | ICD-10-CM

## 2022-06-14 DIAGNOSIS — M5137 Other intervertebral disc degeneration, lumbosacral region: Secondary | ICD-10-CM | POA: Diagnosis not present

## 2022-06-14 MED ORDER — CYCLOBENZAPRINE HCL 10 MG PO TABS: 5.0000 mg | ORAL_TABLET | Freq: Three times a day (TID) | ORAL | 0 refills | Status: DC | PRN

## 2022-06-14 MED ORDER — HYDROCHLOROTHIAZIDE 25 MG PO TABS
25.0000 mg | ORAL_TABLET | Freq: Every day | ORAL | 3 refills | Status: DC
Start: 2022-06-14 — End: 2022-10-11

## 2022-06-14 MED ORDER — ATENOLOL 25 MG PO TABS: 25.0000 mg | ORAL_TABLET | Freq: Every day | ORAL | 0 refills | Status: DC

## 2022-06-14 NOTE — Progress Notes (Signed)
Morgan Ruiz is a 71 y.o. female with the following history as recorded in EpicCare:  Patient Active Problem List   Diagnosis Date Noted   Vitamin D deficiency 04/08/2020   Epigastric discomfort 01/25/2019   History of peptic ulcer disease 01/25/2019   Diabetes mellitus without complication ([place]) 54/62/7035   Hypertension 01/24/2016   Hyperlipidemia 01/24/2016    Current Outpatient Medications  Medication Sig Dispense Refill   atorvastatin (LIPITOR) 20 MG tablet Take 20 mg by mouth daily.     Cholecalciferol (VITAMIN D3 PO) Take 250 mcg by mouth daily.     glipiZIDE (GLUCOTROL XL) 5 MG 24 hr tablet Take 5 mg by mouth daily with breakfast.     omeprazole (PRILOSEC) 20 MG capsule Take 1 capsule (20 mg total) by mouth daily. 90 capsule 3   traMADol (ULTRAM) 50 MG tablet Take 50-100 mg by mouth 4 (four) times daily as needed.     atenolol (TENORMIN) 25 MG tablet Take 1 tablet (25 mg total) by mouth daily. 90 tablet 0   cyclobenzaprine (FLEXERIL) 10 MG tablet Take 0.5-1 tablets (5-10 mg total) by mouth 3 (three) times daily as needed. 60 tablet 0   hydrochlorothiazide (HYDRODIURIL) 25 MG tablet Take 1 tablet (25 mg total) by mouth daily. 90 tablet 3   No current facility-administered medications for this visit.    Allergies: Acetaminophen, Hydrocodone, and Vicodin [hydrocodone-acetaminophen]  Past Medical History:  Diagnosis Date   Diabetes mellitus without complication ([place])    Hyperlipidemia    Hypertension     No past surgical history on file.  Family History  Problem Relation Age of Onset   Diabetes type II Mother    Heart attack Mother    Liver cancer Father    Heart attack Brother     Social History   Tobacco Use   Smoking status: Former    Types: Cigarettes    Quit date: 11/21/2012    Years since quitting: 9.5   Smokeless tobacco: Never   Tobacco comments:    social smoker  Substance Use Topics   Alcohol use: No    Alcohol/week: 0.0 standard drinks of alcohol     Subjective:  Presents today as a new patient;  Transferring from [facility]; History of Type 2 Diabetes/ hypertension/ GERD;  Chronic low back pain- last MRI was done in 03/2021; notes that she does not want to pursue any type of injections.  Needs to come back for fasting labs;  Defers updating vaccines, mammogram, colonoscopy;    Objective:  Vitals:   06/14/22 1404  BP: 132/74  Pulse: 71  Resp: 16  Temp: 98 F (36.7 C)  TempSrc: Oral  SpO2: 97%  Weight: 187 lb (84.8 kg)  Height: '5\' 7"'  (1.702 m)    General: Well developed, well nourished, in no acute distress  Skin : Warm and dry.  Head: Normocephalic and atraumatic  Lungs: Respirations unlabored; clear to auscultation bilaterally without wheeze, rales, rhonchi  CVS exam: normal rate and regular rhythm.  Musculoskeletal: No deformities; no active joint inflammation  Extremities: No edema, cyanosis, clubbing  Vessels: Symmetric bilaterally  Neurologic: Alert and oriented; speech intact; face symmetrical; moves all extremities well; CNII-XII intact without focal deficit    Assessment:  1. Type 2 diabetes mellitus without complication, without long-term current use of insulin ([place])   2. DDD (degenerative disc disease), lumbosacral   3. Vitamin D deficiency   4. Hyperlipidemia, unspecified hyperlipidemia type   5. Gastroesophageal reflux disease, unspecified  whether esophagitis present     Plan:  Refills updated as requested; patient will return for fasting labs; She defers updating mammogram, colonoscopy or vaccines at this time.  Follow up to be determined based on lab results.   Return in about 1 day (around 06/15/2022) for fasting labs.  Orders Placed This Encounter  Procedures   CBC with Differential/Platelet    Standing Status:   Future    Standing Expiration Date:   06/15/2023   Comp Met (CMET)    Standing Status:   Future    Standing Expiration Date:   06/15/2023   Lipid panel    Standing Status:    Future    Standing Expiration Date:   06/15/2023   Vitamin D (25 hydroxy)    Standing Status:   Future    Standing Expiration Date:   06/14/2023   Hemoglobin A1c    Standing Status:   Future    Standing Expiration Date:   06/15/2023   Urine Microalbumin w/creat. ratio    Standing Status:   Future    Standing Expiration Date:   06/15/2023    Requested Prescriptions   Signed Prescriptions Disp Refills   cyclobenzaprine (FLEXERIL) 10 MG tablet 60 tablet 0    Sig: Take 0.5-1 tablets (5-10 mg total) by mouth 3 (three) times daily as needed.   atenolol (TENORMIN) 25 MG tablet 90 tablet 0    Sig: Take 1 tablet (25 mg total) by mouth daily.   hydrochlorothiazide (HYDRODIURIL) 25 MG tablet 90 tablet 3    Sig: Take 1 tablet (25 mg total) by mouth daily.

## 2022-06-15 ENCOUNTER — Other Ambulatory Visit (INDEPENDENT_AMBULATORY_CARE_PROVIDER_SITE_OTHER): Payer: Medicare Other

## 2022-06-15 DIAGNOSIS — E559 Vitamin D deficiency, unspecified: Secondary | ICD-10-CM | POA: Diagnosis not present

## 2022-06-15 DIAGNOSIS — E119 Type 2 diabetes mellitus without complications: Secondary | ICD-10-CM | POA: Diagnosis not present

## 2022-06-15 LAB — CBC WITH DIFFERENTIAL/PLATELET
Basophils Absolute: 0.1 10*3/uL (ref 0.0–0.1)
Basophils Relative: 0.8 % (ref 0.0–3.0)
Eosinophils Absolute: 0.3 10*3/uL (ref 0.0–0.7)
Eosinophils Relative: 3.8 % (ref 0.0–5.0)
HCT: 40 % (ref 36.0–46.0)
Hemoglobin: 13.5 g/dL (ref 12.0–15.0)
Lymphocytes Relative: 33.7 % (ref 12.0–46.0)
Lymphs Abs: 3.1 10*3/uL (ref 0.7–4.0)
MCHC: 33.8 g/dL (ref 30.0–36.0)
MCV: 87.4 fl (ref 78.0–100.0)
Monocytes Absolute: 0.6 10*3/uL (ref 0.1–1.0)
Monocytes Relative: 6.7 % (ref 3.0–12.0)
Neutro Abs: 5 10*3/uL (ref 1.4–7.7)
Neutrophils Relative %: 55 % (ref 43.0–77.0)
Platelets: 330 10*3/uL (ref 150.0–400.0)
RBC: 4.58 Mil/uL (ref 3.87–5.11)
RDW: 13.2 % (ref 11.5–15.5)
WBC: 9.1 10*3/uL (ref 4.0–10.5)

## 2022-06-15 LAB — COMPREHENSIVE METABOLIC PANEL
ALT: 17 U/L (ref 0–35)
AST: 19 U/L (ref 0–37)
Albumin: 4.3 g/dL (ref 3.5–5.2)
Alkaline Phosphatase: 55 U/L (ref 39–117)
BUN: 22 mg/dL (ref 6–23)
CO2: 31 mEq/L (ref 19–32)
Calcium: 9.8 mg/dL (ref 8.4–10.5)
Chloride: 101 mEq/L (ref 96–112)
Creatinine, Ser: 0.97 mg/dL (ref 0.40–1.20)
GFR: 59.73 mL/min — ABNORMAL LOW (ref >60.00)
Glucose, Bld: 115 mg/dL — ABNORMAL HIGH (ref 70–99)
Potassium: 4.7 mEq/L (ref 3.5–5.1)
Sodium: 138 mEq/L (ref 135–145)
Total Bilirubin: 0.8 mg/dL (ref 0.2–1.2)
Total Protein: 7.3 g/dL (ref 6.0–8.3)

## 2022-06-15 LAB — LIPID PANEL
Cholesterol: 154 mg/dL (ref 0–200)
HDL: 45.6 mg/dL (ref >39.00)
LDL Cholesterol: 74 mg/dL (ref 0–99)
NonHDL: 108.81
Total CHOL/HDL Ratio: 3
Triglycerides: 175 mg/dL — ABNORMAL HIGH (ref 0.0–149.0)
VLDL: 35 mg/dL (ref 0.0–40.0)

## 2022-06-15 LAB — MICROALBUMIN / CREATININE URINE RATIO
Creatinine,U: 73.9 mg/dL
Microalb Creat Ratio: 0.9 mg/g (ref 0.0–30.0)
Microalb, Ur: <0.7 mg/dL (ref 0.0–1.9)

## 2022-06-15 LAB — HEMOGLOBIN A1C: Hgb A1c MFr Bld: 7.9 % — ABNORMAL HIGH (ref 4.6–6.5)

## 2022-06-15 LAB — VITAMIN D 25 HYDROXY (VIT D DEFICIENCY, FRACTURES): VITD: 39.13 ng/mL (ref 30.00–100.00)

## 2022-07-13 ENCOUNTER — Other Ambulatory Visit: Payer: Self-pay | Admitting: Family

## 2022-07-13 ENCOUNTER — Telehealth: Payer: Self-pay | Admitting: Family

## 2022-07-13 MED ORDER — GLIPIZIDE ER 5 MG PO TB24
5.0000 mg | ORAL_TABLET | Freq: Every day | ORAL | 0 refills | Status: DC
Start: 2022-07-13 — End: 2022-10-09

## 2022-07-13 NOTE — Telephone Encounter (Signed)
Patient states she will be out of town for two months and needs a refill for her sugar medication for 90 days. Patient only has one pill left for tomorrow and she set up an appointment with Vernona Rieger on 08/25 to discuss what other medication she will need while gone. Patient would like to know if she can get a few pills sent over to her pharmacy to hold her over until she sees Vernona Rieger on Friday. Please advise.

## 2022-07-14 ENCOUNTER — Other Ambulatory Visit: Payer: Self-pay

## 2022-07-14 MED ORDER — OMEPRAZOLE 20 MG PO CPDR: 20.0000 mg | DELAYED_RELEASE_CAPSULE | Freq: Every day | ORAL | 3 refills | Status: DC

## 2022-07-14 NOTE — Telephone Encounter (Signed)
Patient called back and she reports she only needed refills on Omeprazole and glipizide. Patient advised rx for omeprazole will be sent to her pharmacy and that, per provider, she did not need the appointment 07/15/22.  Appointment was cancelled.

## 2022-07-14 NOTE — Telephone Encounter (Signed)
Omeprazole for 3 month supply since she is going to [place] for 2 months.   Pt is tramadol as need for stomach pain. Pt is only requesting about 10-15 tabs since she did have a few pills that has expired in the past. She just wants them as need for her travels.   Please advise on med refill request.   It can still be sent to the same pharmacy on file.   Pt is aware that the Glipizide has been sent

## 2022-07-14 NOTE — Telephone Encounter (Signed)
I have called the pt to ask the pt question below. There was no answer so I left the question below along with a message to call the office back.

## 2022-07-14 NOTE — Telephone Encounter (Signed)
Pt has called back and she stated that she only needed the refills. Morgan Ruiz has canceled the appt for tomorrow and sent in the omeprazole rx refill.   Please advise on the tramadol rx that pt is requesting.

## 2022-07-15 ENCOUNTER — Other Ambulatory Visit: Payer: Self-pay | Admitting: Family

## 2022-07-15 ENCOUNTER — Ambulatory Visit: Payer: Medicare Other | Admitting: Family

## 2022-07-15 MED ORDER — OMEPRAZOLE 20 MG PO CPDR: 20.0000 mg | DELAYED_RELEASE_CAPSULE | Freq: Every day | ORAL | 3 refills | Status: DC

## 2022-07-26 ENCOUNTER — Telehealth: Payer: Self-pay | Admitting: Family

## 2022-07-26 DIAGNOSIS — E785 Hyperlipidemia, unspecified: Secondary | ICD-10-CM

## 2022-07-26 DIAGNOSIS — I10 Essential (primary) hypertension: Secondary | ICD-10-CM

## 2022-07-26 DIAGNOSIS — E119 Type 2 diabetes mellitus without complications: Secondary | ICD-10-CM

## 2022-07-26 MED ORDER — ATENOLOL 25 MG PO TABS: 25.0000 mg | ORAL_TABLET | Freq: Every day | ORAL | 0 refills | Status: DC

## 2022-07-26 MED ORDER — ATORVASTATIN CALCIUM 20 MG PO TABS: 20.0000 mg | ORAL_TABLET | Freq: Every day | ORAL | 0 refills | Status: DC

## 2022-07-26 NOTE — Telephone Encounter (Signed)
I have called pt and she stated that she is leaving for [place] and will be gone for 60 plus days. She is asking for a 90 day refill of some medication. I have sent it to the pharmacy. Okayed per provider.

## 2022-07-26 NOTE — Telephone Encounter (Signed)
Pt is needing rx before she leaves for vacation, preferably today as she will be leaving tomorrow. She is hoping to get a 90 day supply.   Medication: atenolol (TENORMIN) 25 MG tablet  atorvastatin (LIPITOR) 20 MG tablet   Has the patient contacted their pharmacy? No.   Preferred Pharmacy: [id number] - [place], [place] - [address]   [address], [place] [address]  Phone:  [phone]  Fax:  [phone]

## 2022-10-08 ENCOUNTER — Other Ambulatory Visit: Payer: Self-pay | Admitting: Family

## 2022-10-09 ENCOUNTER — Telehealth: Payer: Self-pay | Admitting: Family

## 2022-10-09 NOTE — Telephone Encounter (Signed)
She is due for diabetes follow up;

## 2022-10-10 ENCOUNTER — Telehealth: Payer: Self-pay | Admitting: Family

## 2022-10-10 NOTE — Telephone Encounter (Signed)
I have called the pt and left a message to call back since there was no answer on the phone. She may make a DM follow up appt when she calls back.

## 2022-10-10 NOTE — Telephone Encounter (Signed)
Medication:   atenolol (TENORMIN) 25 MG tablet [[id number]]   atorvastatin (LIPITOR) 20 MG tablet [[id number]]   omeprazole (PRILOSEC) 20 MG capsule [[id number]]   hydrochlorothiazide (HYDRODIURIL) 25 MG tablet [[id number]]   Has the patient contacted their pharmacy? No. (If no, request that the patient contact the pharmacy for the refill.) (If yes, when and what did the pharmacy advise?)  Preferred Pharmacy (with phone number or street name):   [facility] [id number] - [place] - [address], [place] [address] Phone: [phone]  Fax: [phone]  Agent: Please be advised that RX refills may take up to 3 business days. We ask that you follow-up with your pharmacy.

## 2022-10-11 ENCOUNTER — Encounter: Payer: Self-pay | Admitting: Family

## 2022-10-11 ENCOUNTER — Ambulatory Visit (INDEPENDENT_AMBULATORY_CARE_PROVIDER_SITE_OTHER): Payer: Medicare Other | Admitting: Family

## 2022-10-11 VITALS — BP 126/78 | HR 70 | Temp 98.2°F | Resp 18 | Ht 67.0 in | Wt 190.8 lb

## 2022-10-11 DIAGNOSIS — E785 Hyperlipidemia, unspecified: Secondary | ICD-10-CM | POA: Diagnosis not present

## 2022-10-11 DIAGNOSIS — E119 Type 2 diabetes mellitus without complications: Secondary | ICD-10-CM | POA: Diagnosis not present

## 2022-10-11 DIAGNOSIS — K219 Gastro-esophageal reflux disease without esophagitis: Secondary | ICD-10-CM

## 2022-10-11 DIAGNOSIS — I10 Essential (primary) hypertension: Secondary | ICD-10-CM

## 2022-10-11 DIAGNOSIS — Z23 Encounter for immunization: Secondary | ICD-10-CM

## 2022-10-11 DIAGNOSIS — M545 Low back pain, unspecified: Secondary | ICD-10-CM

## 2022-10-11 DIAGNOSIS — G8929 Other chronic pain: Secondary | ICD-10-CM

## 2022-10-11 MED ORDER — GLIPIZIDE ER 5 MG PO TB24: 5.0000 mg | ORAL_TABLET | Freq: Every day | ORAL | 3 refills | Status: DC

## 2022-10-11 MED ORDER — OMEPRAZOLE 20 MG PO CPDR: 20.0000 mg | DELAYED_RELEASE_CAPSULE | Freq: Every day | ORAL | 3 refills | Status: DC

## 2022-10-11 MED ORDER — ATENOLOL 25 MG PO TABS: 25.0000 mg | ORAL_TABLET | Freq: Every day | ORAL | 3 refills | Status: DC

## 2022-10-11 MED ORDER — TRAMADOL HCL 50 MG PO TABS: 50.0000 mg | ORAL_TABLET | Freq: Four times a day (QID) | ORAL | 0 refills | Status: DC | PRN

## 2022-10-11 MED ORDER — ATORVASTATIN CALCIUM 20 MG PO TABS: 20.0000 mg | ORAL_TABLET | Freq: Every day | ORAL | 3 refills | Status: DC

## 2022-10-11 MED ORDER — HYDROCHLOROTHIAZIDE 25 MG PO TABS: 25.0000 mg | ORAL_TABLET | Freq: Every day | ORAL | 3 refills | Status: DC

## 2022-10-11 NOTE — Progress Notes (Signed)
Morgan Ruiz is a 71 y.o. female with the following history as recorded in EpicCare:  Patient Active Problem List   Diagnosis Date Noted   Vitamin D deficiency 04/08/2020   Epigastric discomfort 01/25/2019   History of peptic ulcer disease 01/25/2019   Diabetes mellitus without complication ([place]) 12/45/8099   Hypertension 01/24/2016   Hyperlipidemia 01/24/2016    Current Outpatient Medications  Medication Sig Dispense Refill   atenolol (TENORMIN) 25 MG tablet Take 1 tablet (25 mg total) by mouth daily. 90 tablet 3   atorvastatin (LIPITOR) 20 MG tablet Take 1 tablet (20 mg total) by mouth daily. 90 tablet 3   Cholecalciferol (VITAMIN D3 PO) Take 250 mcg by mouth daily.     cyclobenzaprine (FLEXERIL) 10 MG tablet Take 0.5-1 tablets (5-10 mg total) by mouth 3 (three) times daily as needed. 60 tablet 0   glipiZIDE (GLUCOTROL XL) 5 MG 24 hr tablet Take 1 tablet (5 mg total) by mouth daily with breakfast. 90 tablet 3   hydrochlorothiazide (HYDRODIURIL) 25 MG tablet Take 1 tablet (25 mg total) by mouth daily. 90 tablet 3   omeprazole (PRILOSEC) 20 MG capsule Take 1 capsule (20 mg total) by mouth daily. 90 capsule 3   traMADol (ULTRAM) 50 MG tablet Take 1-2 tablets (50-100 mg total) by mouth 4 (four) times daily as needed. 30 tablet 0   No current facility-administered medications for this visit.    Allergies: Acetaminophen, Hydrocodone, and Vicodin [hydrocodone-acetaminophen]  Past Medical History:  Diagnosis Date   Diabetes mellitus without complication ([place])    Hyperlipidemia    Hypertension     No past surgical history on file.  Family History  Problem Relation Age of Onset   Diabetes type II Mother    Heart attack Mother    Liver cancer Father    Heart attack Brother     Social History   Tobacco Use   Smoking status: Former    Types: Cigarettes    Quit date: 11/21/2012    Years since quitting: 9.8   Smokeless tobacco: Never   Tobacco comments:    social smoker   Substance Use Topics   Alcohol use: No    Alcohol/week: 0.0 standard drinks of alcohol    Subjective:   4 month follow up on Type 2 Diabetes; at last OV, medication changes were recommended but patient wanted to make changes with diet/ exercise first; notes she has "been feeling great" recently and denies any concerns;     Objective:  Vitals:   10/11/22 1427  BP: 126/78  Pulse: 70  Resp: 18  Temp: 98.2 F (36.8 C)  TempSrc: Oral  SpO2: 98%  Weight: 190 lb 12.8 oz (86.5 kg)  Height: _0  (1.702 m)    General: Well developed, well nourished, in no acute distress  Skin : Warm and dry.  Head: Normocephalic and atraumatic  Lungs: Respirations unlabored; clear to auscultation bilaterally without wheeze, rales, rhonchi  CVS exam: normal rate and regular rhythm.  Abdomen: Soft; nontender; nondistended; normoactive bowel sounds; no masses or hepatosplenomegaly  Musculoskeletal: No deformities; no active joint inflammation  Extremities: No edema, cyanosis, clubbing  Vessels: Symmetric bilaterally  Neurologic: Alert and oriented; speech intact; face symmetrical; moves all extremities well; CNII-XII intact without focal deficit  Assessment:  1. Diabetes mellitus without complication ([place])   2. Primary hypertension   3. Hyperlipidemia, unspecified hyperlipidemia type   4. Need for influenza vaccination   5. Gastroesophageal reflux disease, unspecified whether esophagitis present  6. Chronic low back pain, unspecified back pain laterality, unspecified whether sciatica present     Plan:  Check labs today; will update refills today; Stable; refill updated; Stable; refill updated; Flu shot given; Stable; refill updated; Rx for Tramadol- she is given #30 tablets which she gets yearly for maintenance.   No follow-ups on file.  Orders Placed This Encounter  Procedures   Flu Vaccine QUAD High Dose([facility])   CBC with Differential/Platelet   Comp Met (CMET)   Hemoglobin A1c     Requested Prescriptions   Signed Prescriptions Disp Refills   atenolol (TENORMIN) 25 MG tablet 90 tablet 3    Sig: Take 1 tablet (25 mg total) by mouth daily.   atorvastatin (LIPITOR) 20 MG tablet 90 tablet 3    Sig: Take 1 tablet (20 mg total) by mouth daily.   glipiZIDE (GLUCOTROL XL) 5 MG 24 hr tablet 90 tablet 3    Sig: Take 1 tablet (5 mg total) by mouth daily with breakfast.   hydrochlorothiazide (HYDRODIURIL) 25 MG tablet 90 tablet 3    Sig: Take 1 tablet (25 mg total) by mouth daily.   omeprazole (PRILOSEC) 20 MG capsule 90 capsule 3    Sig: Take 1 capsule (20 mg total) by mouth daily.   traMADol (ULTRAM) 50 MG tablet 30 tablet 0    Sig: Take 1-2 tablets (50-100 mg total) by mouth 4 (four) times daily as needed.

## 2022-10-12 LAB — CBC WITH DIFFERENTIAL/PLATELET
Basophils Absolute: 0.1 10*3/uL (ref 0.0–0.1)
Basophils Relative: 1 % (ref 0.0–3.0)
Eosinophils Absolute: 0.3 10*3/uL (ref 0.0–0.7)
Eosinophils Relative: 2.6 % (ref 0.0–5.0)
HCT: 40.6 % (ref 36.0–46.0)
Hemoglobin: 13.7 g/dL (ref 12.0–15.0)
Lymphocytes Relative: 28.5 % (ref 12.0–46.0)
Lymphs Abs: 2.9 10*3/uL (ref 0.7–4.0)
MCHC: 33.8 g/dL (ref 30.0–36.0)
MCV: 88.3 fl (ref 78.0–100.0)
Monocytes Absolute: 0.7 10*3/uL (ref 0.1–1.0)
Monocytes Relative: 6.6 % (ref 3.0–12.0)
Neutro Abs: 6.3 10*3/uL (ref 1.4–7.7)
Neutrophils Relative %: 61.3 % (ref 43.0–77.0)
Platelets: 348 10*3/uL (ref 150.0–400.0)
RBC: 4.6 Mil/uL (ref 3.87–5.11)
RDW: 13 % (ref 11.5–15.5)
WBC: 10.2 10*3/uL (ref 4.0–10.5)

## 2022-10-12 LAB — COMPREHENSIVE METABOLIC PANEL
ALT: 23 U/L (ref 0–35)
AST: 21 U/L (ref 0–37)
Albumin: 4.2 g/dL (ref 3.5–5.2)
Alkaline Phosphatase: 53 U/L (ref 39–117)
BUN: 24 mg/dL — ABNORMAL HIGH (ref 6–23)
CO2: 30 mEq/L (ref 19–32)
Calcium: 9.7 mg/dL (ref 8.4–10.5)
Chloride: 102 mEq/L (ref 96–112)
Creatinine, Ser: 1.14 mg/dL (ref 0.40–1.20)
GFR: 49.09 mL/min — ABNORMAL LOW (ref >60.00)
Glucose, Bld: 223 mg/dL — ABNORMAL HIGH (ref 70–99)
Potassium: 5 mEq/L (ref 3.5–5.1)
Sodium: 137 mEq/L (ref 135–145)
Total Bilirubin: 0.5 mg/dL (ref 0.2–1.2)
Total Protein: 7.1 g/dL (ref 6.0–8.3)

## 2022-10-12 LAB — HEMOGLOBIN A1C: Hgb A1c MFr Bld: 9.1 % — ABNORMAL HIGH (ref 4.6–6.5)

## 2022-10-20 ENCOUNTER — Telehealth: Payer: Self-pay

## 2022-10-20 NOTE — Telephone Encounter (Signed)
-----   Message from Morgan Bass, FNP sent at 10/17/2022  5:45 PM EST ----- Hgba1c is up to 9; would recommend adjusting her medication. When we recommended this previously, she declined. Is that still her response?

## 2022-10-20 NOTE — Telephone Encounter (Signed)
Spoke with Pt she stated she would not like to change the dose of her meds she would like to come in a couple of weeks to repeat labs, as the night before previous labs she had eaten a lot of sweets.

## 2022-10-21 ENCOUNTER — Encounter: Payer: Self-pay | Admitting: Family

## 2022-11-16 NOTE — Telephone Encounter (Signed)
Called Pt to schedule follow up appt, left voicemail to call our office back.

## 2022-11-17 NOTE — Telephone Encounter (Signed)
Appt scheduled 12/06/22 

## 2022-12-06 ENCOUNTER — Ambulatory Visit: Payer: Medicare Other | Admitting: Family

## 2022-12-13 ENCOUNTER — Ambulatory Visit: Payer: Medicare Other | Admitting: Family

## 2023-01-10 ENCOUNTER — Other Ambulatory Visit: Payer: Self-pay | Admitting: Family

## 2023-01-10 ENCOUNTER — Ambulatory Visit (INDEPENDENT_AMBULATORY_CARE_PROVIDER_SITE_OTHER): Payer: Medicare Other | Admitting: Family

## 2023-01-10 ENCOUNTER — Encounter: Payer: Self-pay | Admitting: Family

## 2023-01-10 VITALS — BP 140/80 | HR 64 | Resp 18 | Ht 67.0 in | Wt 187.2 lb

## 2023-01-10 DIAGNOSIS — I10 Essential (primary) hypertension: Secondary | ICD-10-CM

## 2023-01-10 DIAGNOSIS — E119 Type 2 diabetes mellitus without complications: Secondary | ICD-10-CM | POA: Diagnosis not present

## 2023-01-10 LAB — CBC WITH DIFFERENTIAL/PLATELET
Basophils Absolute: 0.1 10*3/uL (ref 0.0–0.1)
Basophils Relative: 0.9 % (ref 0.0–3.0)
Eosinophils Absolute: 0.3 10*3/uL (ref 0.0–0.7)
Eosinophils Relative: 3 % (ref 0.0–5.0)
HCT: 39.7 % (ref 36.0–46.0)
Hemoglobin: 13.9 g/dL (ref 12.0–15.0)
Lymphocytes Relative: 25.7 % (ref 12.0–46.0)
Lymphs Abs: 2.7 10*3/uL (ref 0.7–4.0)
MCHC: 34.9 g/dL (ref 30.0–36.0)
MCV: 86.8 fl (ref 78.0–100.0)
Monocytes Absolute: 0.6 10*3/uL (ref 0.1–1.0)
Monocytes Relative: 5.2 % (ref 3.0–12.0)
Neutro Abs: 6.9 10*3/uL (ref 1.4–7.7)
Neutrophils Relative %: 65.2 % (ref 43.0–77.0)
Platelets: 369 10*3/uL (ref 150.0–400.0)
RBC: 4.57 Mil/uL (ref 3.87–5.11)
RDW: 12.6 % (ref 11.5–15.5)
WBC: 10.6 10*3/uL — ABNORMAL HIGH (ref 4.0–10.5)

## 2023-01-10 LAB — COMPREHENSIVE METABOLIC PANEL
ALT: 24 U/L (ref 0–35)
AST: 21 U/L (ref 0–37)
Albumin: 4.2 g/dL (ref 3.5–5.2)
Alkaline Phosphatase: 58 U/L (ref 39–117)
BUN: 26 mg/dL — ABNORMAL HIGH (ref 6–23)
CO2: 24 mEq/L (ref 19–32)
Calcium: 10 mg/dL (ref 8.4–10.5)
Chloride: 100 mEq/L (ref 96–112)
Creatinine, Ser: 1 mg/dL (ref 0.40–1.20)
GFR: 57.35 mL/min — ABNORMAL LOW (ref >60.00)
Glucose, Bld: 233 mg/dL — ABNORMAL HIGH (ref 70–99)
Potassium: 4 mEq/L (ref 3.5–5.1)
Sodium: 135 mEq/L (ref 135–145)
Total Bilirubin: 0.6 mg/dL (ref 0.2–1.2)
Total Protein: 7.3 g/dL (ref 6.0–8.3)

## 2023-01-10 LAB — HEMOGLOBIN A1C: Hgb A1c MFr Bld: 8.4 % — ABNORMAL HIGH (ref 4.6–6.5)

## 2023-01-10 MED ORDER — ATENOLOL 25 MG PO TABS: 25.0000 mg | ORAL_TABLET | Freq: Two times a day (BID) | ORAL | 1 refills | Status: DC

## 2023-01-10 MED ORDER — GLIPIZIDE ER 10 MG PO TB24: 10.0000 mg | ORAL_TABLET | Freq: Every day | ORAL | 0 refills | Status: DC

## 2023-01-10 NOTE — Progress Notes (Signed)
Morgan Ruiz is a 72 y.o. female with the following history as recorded in EpicCare:  Patient Active Problem List   Diagnosis Date Noted   Vitamin D deficiency 04/08/2020   Epigastric discomfort 01/25/2019   History of peptic ulcer disease 01/25/2019   Diabetes mellitus without complication ([place]) [id number]   Hypertension 01/24/2016   Hyperlipidemia 01/24/2016    Current Outpatient Medications  Medication Sig Dispense Refill   atorvastatin (LIPITOR) 20 MG tablet Take 1 tablet (20 mg total) by mouth daily. 90 tablet 3   Cholecalciferol (VITAMIN D3 PO) Take 250 mcg by mouth daily.     cyclobenzaprine (FLEXERIL) 10 MG tablet Take 0.5-1 tablets (5-10 mg total) by mouth 3 (three) times daily as needed. 60 tablet 0   glipiZIDE (GLUCOTROL XL) 5 MG 24 hr tablet Take 1 tablet (5 mg total) by mouth daily with breakfast. 90 tablet 3   hydrochlorothiazide (HYDRODIURIL) 25 MG tablet Take 1 tablet (25 mg total) by mouth daily. 90 tablet 3   omeprazole (PRILOSEC) 20 MG capsule Take 1 capsule (20 mg total) by mouth daily. 90 capsule 3   traMADol (ULTRAM) 50 MG tablet Take 1-2 tablets (50-100 mg total) by mouth 4 (four) times daily as needed. 30 tablet 0   atenolol (TENORMIN) 25 MG tablet Take 1 tablet (25 mg total) by mouth 2 (two) times daily. 180 tablet 1   No current facility-administered medications for this visit.    Allergies: Acetaminophen, Hydrocodone, and Vicodin [hydrocodone-acetaminophen]  Past Medical History:  Diagnosis Date   Diabetes mellitus without complication ([place])    Hyperlipidemia    Hypertension     No past surgical history on file.  Family History  Problem Relation Age of Onset   Diabetes type II Mother    Heart attack Mother    Liver cancer Father    Heart attack Brother     Social History   Tobacco Use   Smoking status: Former    Types: Cigarettes    Quit date: 11/21/2012    Years since quitting: 10.1   Smokeless tobacco: Never   Tobacco comments:    social  smoker  Substance Use Topics   Alcohol use: No    Alcohol/week: 0.0 standard drinks of alcohol    Subjective:   Follow up on HTN/ Type 2 Diabetes; is noticing that blood pressure not controlled; at last visit, patient noted that she did not want to change her medications but would work on dietary changes; unfortunately, has not been able to work on any dietary changes; Is adamant that she does not want to take Metformin or consider taking any type of injectable medication; prefers to only use the Glucotrol that she is currently taking- would be willing to consider increasing dosage;   Objective:  Vitals:   01/10/23 0826 01/10/23 0855  BP: (!) 142/78 (!) 140/80  Pulse: 64   Resp: 18   SpO2: 96%   Weight: 187 lb 3.2 oz (84.9 kg)   Height: 5' 7"$  (1.702 m)     General: Well developed, well nourished, in no acute distress  Skin : Warm and dry.  Head: Normocephalic and atraumatic  Lungs: Respirations unlabored; clear to auscultation bilaterally without wheeze, rales, rhonchi  CVS exam: normal rate and regular rhythm.  Extremities: No edema, cyanosis, clubbing  Vessels: Symmetric bilaterally  Neurologic: Alert and oriented; speech intact; face symmetrical; moves all extremities well; CNII-XII intact without focal deficit   Assessment:  1. Type 2 diabetes mellitus without complication, without  long-term current use of insulin ([place])   2. Primary hypertension     Plan:  Uncontrolled; patient is adamant that she does not want Metformin or any type of injectable; would prefer not to add any new medication but is agreeable to increasing dosage of Glucotrol; follow up to be determined; Uncontrolled; does not want new medication; will increase Atenolol to 25 mg bid- she has been taking 1/2 tablet bid; continue HCTZ 25 mg daily; follow up in 3 months;   No follow-ups on file.  Orders Placed This Encounter  Procedures   CBC with Differential/Platelet   Comp Met (CMET)   Hemoglobin A1c     Requested Prescriptions   Signed Prescriptions Disp Refills   atenolol (TENORMIN) 25 MG tablet 180 tablet 1    Sig: Take 1 tablet (25 mg total) by mouth 2 (two) times daily.

## 2023-01-10 NOTE — Patient Instructions (Signed)
Please start taking a full tablet of Atenolol twice a day; continue the HCTZ daily;

## 2023-03-10 ENCOUNTER — Ambulatory Visit (INDEPENDENT_AMBULATORY_CARE_PROVIDER_SITE_OTHER): Payer: Medicare Other | Admitting: Family

## 2023-03-10 ENCOUNTER — Encounter: Payer: Self-pay | Admitting: Family

## 2023-03-10 ENCOUNTER — Ambulatory Visit (HOSPITAL_BASED_OUTPATIENT_CLINIC_OR_DEPARTMENT_OTHER)
Admission: RE | Admit: 2023-03-10 | Discharge: 2023-03-10 | Disposition: A | Discharge status: Home / Self Care | Payer: Medicare Other | Source: Ambulatory Visit | Attending: Family | Admitting: Family

## 2023-03-10 VITALS — BP 144/74 | HR 59 | Temp 98.0°F | Resp 16 | Ht 67.0 in | Wt 189.4 lb

## 2023-03-10 DIAGNOSIS — R1032 Left lower quadrant pain: Secondary | ICD-10-CM

## 2023-03-10 DIAGNOSIS — M5137 Other intervertebral disc degeneration, lumbosacral region: Secondary | ICD-10-CM | POA: Diagnosis not present

## 2023-03-10 LAB — POC URINALSYSI DIPSTICK (AUTOMATED)
Bilirubin, UA: NEGATIVE
Blood, UA: NEGATIVE
Glucose, UA: NEGATIVE
Ketones, UA: NEGATIVE
Leukocytes, UA: NEGATIVE
Nitrite, UA: NEGATIVE
Protein, UA: NEGATIVE
Spec Grav, UA: <=1.005 — AB (ref 1.010–1.025)
Urobilinogen, UA: 0.2 E.U./dL
pH, UA: 7.5 (ref 5.0–8.0)

## 2023-03-10 MED ORDER — CYCLOBENZAPRINE HCL 10 MG PO TABS: 5.0000 mg | ORAL_TABLET | Freq: Three times a day (TID) | ORAL | 0 refills | Status: DC | PRN

## 2023-03-10 MED ORDER — MELOXICAM 15 MG PO TABS: 15.0000 mg | ORAL_TABLET | Freq: Every day | ORAL | 0 refills | Status: AC

## 2023-03-10 NOTE — Progress Notes (Signed)
Morgan Ruiz is a 72 y.o. female with the following history as recorded in EpicCare:  Patient Active Problem List   Diagnosis Date Noted   Vitamin D deficiency 04/08/2020   Epigastric discomfort 01/25/2019   History of peptic ulcer disease 01/25/2019   Diabetes mellitus without complication 01/24/2016   Hypertension 01/24/2016   Hyperlipidemia 01/24/2016    Current Outpatient Medications  Medication Sig Dispense Refill   atenolol (TENORMIN) 25 MG tablet Take 1 tablet (25 mg total) by mouth 2 (two) times daily. 180 tablet 1   atorvastatin (LIPITOR) 20 MG tablet Take 1 tablet (20 mg total) by mouth daily. 90 tablet 3   Cholecalciferol (VITAMIN D3 PO) Take 250 mcg by mouth daily.     glipiZIDE (GLUCOTROL XL) 10 MG 24 hr tablet Take 1 tablet (10 mg total) by mouth daily with breakfast. 90 tablet 0   hydrochlorothiazide (HYDRODIURIL) 25 MG tablet Take 1 tablet (25 mg total) by mouth daily. 90 tablet 3   meloxicam (MOBIC) 15 MG tablet Take 1 tablet (15 mg total) by mouth daily. 30 tablet 0   omeprazole (PRILOSEC) 20 MG capsule Take 1 capsule (20 mg total) by mouth daily. 90 capsule 3   cyclobenzaprine (FLEXERIL) 10 MG tablet Take 0.5-1 tablets (5-10 mg total) by mouth 3 (three) times daily as needed. 60 tablet 0   traMADol (ULTRAM) 50 MG tablet Take 1-2 tablets (50-100 mg total) by mouth 4 (four) times daily as needed. (Patient not taking: Reported on 03/10/2023) 30 tablet 0   No current facility-administered medications for this visit.    Allergies: Acetaminophen, Hydrocodone, and Vicodin [hydrocodone-acetaminophen]  Past Medical History:  Diagnosis Date   Diabetes mellitus without complication    Hyperlipidemia    Hypertension     No past surgical history on file.  Family History  Problem Relation Age of Onset   Diabetes type II Mother    Heart attack Mother    Liver cancer Father    Heart attack Brother     Social History   Tobacco Use   Smoking status: Former    Types:  Cigarettes    Quit date: 11/21/2012    Years since quitting: 10.3   Smokeless tobacco: Never   Tobacco comments:    social smoker  Substance Use Topics   Alcohol use: No    Alcohol/week: 0.0 standard drinks of alcohol    Subjective:   Concerned for possible kidney infection; left sided flank pain x 3 days; no burning/ frequency with urination; did have kidney stone at age 5; no blood in urine;    Objective:  Vitals:   03/10/23 1307  BP: (!) 144/74  Pulse: (!) 59  Resp: 16  Temp: 98 F (36.7 C)  TempSrc: Oral  SpO2: 96%  Weight: 189 lb 6 oz (85.9 kg)  Height:  (1.702 m)    General: Well developed, well nourished, in no acute distress  Skin : Warm and dry.  Head: Normocephalic and atraumatic  Eyes: Sclera and conjunctiva clear; pupils round and reactive to light; extraocular movements intact  Ears: External normal; canals clear; tympanic membranes normal  Oropharynx: Pink, supple. No suspicious lesions  Neck: Supple without thyromegaly, adenopathy  Lungs: Respirations unlabored;  Musculoskeletal: No deformities; no active joint inflammation  Extremities: No edema, cyanosis, clubbing  Vessels: Symmetric bilaterally  Neurologic: Alert and oriented; speech intact; face symmetrical; moves all extremities well; CNII-XII intact without focal deficit   Assessment:  1. Left lower quadrant abdominal pain  2. DDD (degenerative disc disease), lumbosacral     Plan:  Suspect muscular but due to history of renal stone will get STAT renal CT; CT shows no kidney stone; will update Rx for Mobic and Flexeril- patient defers prednisone or pain medication; refer back to her orthopedist for follow up care.   No follow-ups on file.  Orders Placed This Encounter  Procedures   CT RENAL STONE STUDY    Standing Status:   Future    Number of Occurrences:   1    Standing Expiration Date:   03/09/2024    Order Specific Question:   Preferred imaging location?    Answer:   [facility]   Ambulatory referral to Orthopedics    Referral Priority:   Routine    Referral Type:   Consultation    Referred to Provider:   Lavada Mesi, MD    Number of Visits Requested:   1   POCT Urinalysis Dipstick (Automated)    Requested Prescriptions   Signed Prescriptions Disp Refills   cyclobenzaprine (FLEXERIL) 10 MG tablet 60 tablet 0    Sig: Take 0.5-1 tablets (5-10 mg total) by mouth 3 (three) times daily as needed.   meloxicam (MOBIC) 15 MG tablet 30 tablet 0    Sig: Take 1 tablet (15 mg total) by mouth daily.

## 2023-03-10 NOTE — Progress Notes (Signed)
Results reviewed with patient

## 2023-03-20 ENCOUNTER — Telehealth: Payer: Self-pay | Admitting: Family

## 2023-03-20 NOTE — Telephone Encounter (Signed)
Left message on machine to call back  

## 2023-03-20 NOTE — Telephone Encounter (Signed)
Prescription Request  03/20/2023  Is this a "Controlled Substance" medicine? No  LOV: 03/10/2023  What is the name of the medication or equipment?  glipiZIDE (GLUCOTROL XL) 10 MG 24 hr tablet  (PT said that 5 mg works better)   atenolol (TENORMIN) 25 MG tablet -   PLEASE SEND 90 Day supply for both   Have you contacted your pharmacy to request a refill? No   Which pharmacy would you like this sent to?  [facility] [id number] - [place] - [address] [place] [address] Phone: [phone] Fax: [phone]    Patient notified that their request is being sent to the clinical staff for review and that they should receive a response within 2 business days.   Please advise at Mobile [phone] (mobile)

## 2023-03-21 ENCOUNTER — Other Ambulatory Visit: Payer: Self-pay

## 2023-03-21 ENCOUNTER — Other Ambulatory Visit: Payer: Self-pay | Admitting: Family

## 2023-03-21 DIAGNOSIS — I10 Essential (primary) hypertension: Secondary | ICD-10-CM

## 2023-03-21 MED ORDER — GLIPIZIDE ER 5 MG PO TB24: 5.0000 mg | ORAL_TABLET | Freq: Every day | ORAL | 1 refills | Status: DC

## 2023-03-21 MED ORDER — ATENOLOL 25 MG PO TABS
25.0000 mg | ORAL_TABLET | Freq: Two times a day (BID) | ORAL | 1 refills | Status: DC
Start: 2023-03-21 — End: 2024-01-30

## 2023-03-21 NOTE — Telephone Encounter (Signed)
Pt called back and stated that the 10 mg does keep her blood sugars at a better range. It makes her feels bad. More weak, sluggish. She just feels better overall on the 5 mg dose of the Glipizide.   I went ahead and refilled the Amlodipine since she says she would be out tonight.

## 2023-03-21 NOTE — Telephone Encounter (Signed)
Atenolol

## 2023-03-21 NOTE — Telephone Encounter (Addendum)
Spoke with pt, scheduled pt an appt 04/04/2023. Pt states she would not like to try any news medications at the time and would like to only take Glipizide 5 mg as 10 mg makes pt not "feel good" but will come in office to discuss further with PCP.

## 2023-04-04 ENCOUNTER — Ambulatory Visit: Payer: Medicare Other | Admitting: Family

## 2023-04-07 ENCOUNTER — Other Ambulatory Visit: Payer: Self-pay | Admitting: Family

## 2023-05-05 ENCOUNTER — Ambulatory Visit (INDEPENDENT_AMBULATORY_CARE_PROVIDER_SITE_OTHER): Payer: Medicare Other | Admitting: Family

## 2023-05-05 ENCOUNTER — Encounter: Payer: Self-pay | Admitting: Family

## 2023-05-05 VITALS — BP 136/70 | Ht 67.0 in | Wt 189.4 lb

## 2023-05-05 DIAGNOSIS — H6122 Impacted cerumen, left ear: Secondary | ICD-10-CM

## 2023-05-05 DIAGNOSIS — M5137 Other intervertebral disc degeneration, lumbosacral region: Secondary | ICD-10-CM | POA: Diagnosis not present

## 2023-05-05 MED ORDER — TRAMADOL HCL 50 MG PO TABS: 50.0000 mg | ORAL_TABLET | Freq: Four times a day (QID) | ORAL | 0 refills | Status: DC | PRN

## 2023-05-05 NOTE — Patient Instructions (Signed)
Please return for repeat labs/ follow up when you return in September; I have updated your Tramadol prescription for you.  Talk to the pharmacy about updating refills for you- they can do a vacation exception;

## 2023-05-05 NOTE — Progress Notes (Unsigned)
Morgan Ruiz is a 72 y.o. female with the following history as recorded in EpicCare:  Patient Active Problem List   Diagnosis Date Noted   Vitamin D deficiency 04/08/2020   Epigastric discomfort 01/25/2019   History of peptic ulcer disease 01/25/2019   Diabetes mellitus without complication ([facility]) 01/24/2016   Hypertension 01/24/2016   Hyperlipidemia 01/24/2016    Current Outpatient Medications  Medication Sig Dispense Refill   atenolol (TENORMIN) 25 MG tablet Take 1 tablet (25 mg total) by mouth 2 (two) times daily. 180 tablet 1   atorvastatin (LIPITOR) 20 MG tablet Take 1 tablet (20 mg total) by mouth daily. 90 tablet 3   Cholecalciferol (VITAMIN D3 PO) Take 250 mcg by mouth daily.     cyclobenzaprine (FLEXERIL) 10 MG tablet Take 0.5-1 tablets (5-10 mg total) by mouth 3 (three) times daily as needed. 60 tablet 0   glipiZIDE (GLUCOTROL XL) 5 MG 24 hr tablet Take 1 tablet (5 mg total) by mouth daily with breakfast. 90 tablet 1   hydrochlorothiazide (HYDRODIURIL) 25 MG tablet Take 1 tablet (25 mg total) by mouth daily. 90 tablet 3   meloxicam (MOBIC) 15 MG tablet Take 1 tablet (15 mg total) by mouth daily. 30 tablet 0   omeprazole (PRILOSEC) 20 MG capsule Take 1 capsule (20 mg total) by mouth daily. 90 capsule 3   traMADol (ULTRAM) 50 MG tablet Take 1-2 tablets (50-100 mg total) by mouth 4 (four) times daily as needed. 30 tablet 0   No current facility-administered medications for this visit.    Allergies: Acetaminophen, Hydrocodone, and Vicodin [hydrocodone-acetaminophen]  Past Medical History:  Diagnosis Date   Diabetes mellitus without complication ([facility])    Hyperlipidemia    Hypertension     No past surgical history on file.  Family History  Problem Relation Age of Onset   Diabetes type II Mother    Heart attack Mother    Liver cancer Father    Heart attack Brother     Social History   Tobacco Use   Smoking status: Former    Types: Cigarettes    Quit date: 11/21/2012     Years since quitting: 10.4   Smokeless tobacco: Never   Tobacco comments:    social smoker  Substance Use Topics   Alcohol use: No    Alcohol/week: 0.0 standard drinks of alcohol    Subjective:   Patient is leaving for [place] next week- will be there for 2 1/2 months; does not want to update any labs today to follow up on her Type 2 Diabetes; Is requesting refill on her Tramadol to help with chronic back pain- was referred to orthopedics in April 2024 but has not bee able to schedule; Is also concerned that has ear wax in her left ear and requesting lavage;    Objective:  Vitals:   05/05/23 1308  BP: 136/70  Weight: 189 lb 6.4 oz (85.9 kg)  Height: 5\' 7"  (1.702 m)    General: Well developed, well nourished, in no acute distress  Skin : Warm and dry.  Head: Normocephalic and atraumatic  Eyes: Sclera and conjunctiva clear; pupils round and reactive to light; extraocular movements intact  Ears: External normal; canals clear; tympanic membranes normal  Oropharynx: Pink, supple. No suspicious lesions  Neck: Supple without thyromegaly, adenopathy  Lungs: Respirations unlabored; clear to auscultation bilaterally without wheeze, rales, rhonchi  CVS exam: normal rate and regular rhythm.  Neurologic: Alert and oriented; speech intact; face symmetrical; moves all extremities well;  CNII-XII intact without focal deficit   Assessment:  1. DDD (degenerative disc disease), lumbosacral   2. Impacted cerumen of left ear     Plan:  Refill updated on Tramadol; will discuss getting her back to her specialist once she returns from [place] this fall; Attempted ear lavage- she will try using Debrox at home and return as unsuccessful today;  Patient defers updating labs today to follow up on her diabetes.   No follow-ups on file.  No orders of the defined types were placed in this encounter.   Requested Prescriptions   Signed Prescriptions Disp Refills   traMADol (ULTRAM) 50 MG tablet 30  tablet 0    Sig: Take 1-2 tablets (50-100 mg total) by mouth 4 (four) times daily as needed.

## 2023-05-23 ENCOUNTER — Telehealth: Payer: Self-pay | Admitting: Family

## 2023-05-23 NOTE — Telephone Encounter (Signed)
Copied from CRM [id number]. Topic: Medicare AWV >> May 23, 2023 10:40 AM Payton Doughty wrote: Reason for CRM: LM 05/23/2023 to schedule AWV   Verlee Rossetti; Care Guide Ambulatory Clinical Support [place] [facility] Direct Dial: [phone]

## 2023-09-05 ENCOUNTER — Ambulatory Visit: Payer: Medicare Other | Admitting: Family

## 2023-10-03 ENCOUNTER — Other Ambulatory Visit: Payer: Self-pay | Admitting: Family

## 2023-11-16 ENCOUNTER — Other Ambulatory Visit: Payer: Self-pay | Admitting: Family

## 2023-11-16 DIAGNOSIS — E785 Hyperlipidemia, unspecified: Secondary | ICD-10-CM

## 2023-12-12 ENCOUNTER — Other Ambulatory Visit: Payer: Self-pay | Admitting: Family

## 2023-12-12 DIAGNOSIS — E785 Hyperlipidemia, unspecified: Secondary | ICD-10-CM

## 2023-12-13 ENCOUNTER — Other Ambulatory Visit: Payer: Self-pay | Admitting: Family

## 2023-12-17 ENCOUNTER — Other Ambulatory Visit: Payer: Self-pay | Admitting: Family

## 2023-12-17 DIAGNOSIS — E785 Hyperlipidemia, unspecified: Secondary | ICD-10-CM

## 2023-12-22 ENCOUNTER — Other Ambulatory Visit: Payer: Self-pay | Admitting: *Deleted

## 2023-12-22 ENCOUNTER — Telehealth: Payer: Self-pay | Admitting: Family

## 2023-12-22 DIAGNOSIS — E785 Hyperlipidemia, unspecified: Secondary | ICD-10-CM

## 2023-12-22 MED ORDER — ATORVASTATIN CALCIUM 20 MG PO TABS: 20.0000 mg | ORAL_TABLET | Freq: Every day | ORAL | 0 refills | Status: DC

## 2023-12-22 MED ORDER — HYDROCHLOROTHIAZIDE 25 MG PO TABS: 25.0000 mg | ORAL_TABLET | Freq: Every day | ORAL | 0 refills | Status: DC

## 2023-12-22 MED ORDER — OMEPRAZOLE 20 MG PO CPDR: 20.0000 mg | DELAYED_RELEASE_CAPSULE | Freq: Every day | ORAL | 0 refills | Status: DC

## 2023-12-22 NOTE — Telephone Encounter (Signed)
Pt came in office stating needing refill for 90 days on atorvastatin (LIPITOR) 20 MG tablet, omeprazole (PRILOSEC) 20 MG capsule  and hydrochlorothiazide (HYDRODIURIL) 25 MG tablet sent to [facility] [id number] - [place], [place] - [address], [place] [address] Phone: [phone]  Fax: [phone]. Pt was informed possible will need appt since last seen since 05-05-2023. Pt stated if so to call her (pt tel [phone]) pt mentioned is out of her meds and needing them ASAP.

## 2023-12-22 NOTE — Telephone Encounter (Signed)
 Refills sent to pharmacy.

## 2024-01-04 ENCOUNTER — Other Ambulatory Visit: Payer: Self-pay | Admitting: Family

## 2024-01-20 ENCOUNTER — Other Ambulatory Visit: Payer: Self-pay | Admitting: Family

## 2024-01-29 ENCOUNTER — Other Ambulatory Visit: Payer: Self-pay | Admitting: Family

## 2024-01-29 DIAGNOSIS — E785 Hyperlipidemia, unspecified: Secondary | ICD-10-CM

## 2024-01-30 ENCOUNTER — Telehealth: Payer: Self-pay | Admitting: Family

## 2024-01-30 ENCOUNTER — Other Ambulatory Visit: Payer: Self-pay | Admitting: Family

## 2024-01-30 DIAGNOSIS — E785 Hyperlipidemia, unspecified: Secondary | ICD-10-CM

## 2024-01-30 DIAGNOSIS — I10 Essential (primary) hypertension: Secondary | ICD-10-CM

## 2024-01-30 NOTE — Telephone Encounter (Signed)
 Please send patient a letter- she is overdue for OV; needs to schedule appointment; no further refills can be given

## 2024-01-31 NOTE — Telephone Encounter (Signed)
 Letter has been printed and placed up front to mail out to pt.

## 2024-02-27 ENCOUNTER — Other Ambulatory Visit: Payer: Self-pay | Admitting: Family

## 2024-03-20 ENCOUNTER — Other Ambulatory Visit: Payer: Self-pay | Admitting: Family

## 2024-03-20 DIAGNOSIS — I10 Essential (primary) hypertension: Secondary | ICD-10-CM

## 2024-03-22 ENCOUNTER — Other Ambulatory Visit: Payer: Self-pay | Admitting: Family

## 2024-03-22 DIAGNOSIS — I10 Essential (primary) hypertension: Secondary | ICD-10-CM

## 2024-04-11 ENCOUNTER — Other Ambulatory Visit: Payer: Self-pay | Admitting: Family

## 2024-04-11 DIAGNOSIS — I10 Essential (primary) hypertension: Secondary | ICD-10-CM

## 2024-04-18 ENCOUNTER — Ambulatory Visit: Payer: Self-pay

## 2024-04-18 NOTE — Telephone Encounter (Signed)
  Chief Complaint: HTN Symptoms: L arm numbness/tingling,  Frequency: ongoing, states pain in L arm worse today  Pertinent Negatives: Patient denies worsening SOB, dizziness, CMS changes, speech changes, vision changes, HA Disposition: [x] ED /[] Urgent Care (no appt availability in office) / [] Appointment(In office/virtual)/ []  [facility]/ [] Home Care/ [] Refused Recommended Disposition /[] [facility]/ []  Follow-up with PCP Additional Notes: Pt states that she is out of her medications. Pt states that her BP is going up and down. Pt states 2 hours ago 187/103. 1.5 hours ago pt states her BP 175/97. During call pt BP was 181/91. Pt states that she is taking her BP meds daily. Pt states that she always takes the BP after she has been sitting for a bit. Pt states that her tingling in fingers is not new.  Pt states that she has new L arm pain that started today and cont elevated BP, pt advised to go to Ed.  Copied from CRM [id number]. Topic: Clinical - Red Word Triage >> Apr 18, 2024  1:57 PM [place] E wrote: [facility] that prompted transfer to Nurse Triage: Elevated blood pressure. Patient stated her BP today was 187/103. Tingling sensation in her fingers on left hand. Reason for Disposition  [1] Systolic BP  >= 160 OR Diastolic >= 100 AND [2] cardiac (e.g., breathing difficulty, chest pain) or neurologic symptoms (e.g., new-onset blurred or double vision, unsteady gait)  Answer Assessment - Initial Assessment Questions 1. BLOOD PRESSURE: "What is the blood pressure?" "Did you take at least two measurements 5 minutes apart?"     175/97 181/91 2. ONSET: "When did you take your blood pressure?"     today 3. HOW: "How did you take your blood pressure?" (e.g., automatic home BP monitor, visiting nurse)     Automatic 4. HISTORY: "Do you have a history of high blood pressure?"     Yes  5. MEDICINES: "Are you taking any medicines for blood pressure?" "Have you missed any doses  recently?"     Taking medications as prescribed 6. OTHER SYMPTOMS: "Do you have any symptoms?" (e.g., blurred vision, chest pain, difficulty breathing, headache, weakness)     Slight difficulty breathing-x2-3 years,  Protocols used: Blood Pressure - High-A-AH

## 2024-04-18 NOTE — Telephone Encounter (Signed)
FYI. Pt going to ED.  

## 2024-04-19 ENCOUNTER — Encounter: Payer: Self-pay | Admitting: Family

## 2024-04-19 ENCOUNTER — Ambulatory Visit: Admitting: Family

## 2024-04-19 VITALS — BP 164/92 | HR 86 | Ht 67.0 in | Wt 182.8 lb

## 2024-04-19 DIAGNOSIS — R0989 Other specified symptoms and signs involving the circulatory and respiratory systems: Secondary | ICD-10-CM

## 2024-04-19 DIAGNOSIS — E785 Hyperlipidemia, unspecified: Secondary | ICD-10-CM

## 2024-04-19 DIAGNOSIS — E119 Type 2 diabetes mellitus without complications: Secondary | ICD-10-CM | POA: Diagnosis not present

## 2024-04-19 DIAGNOSIS — I10 Essential (primary) hypertension: Secondary | ICD-10-CM | POA: Diagnosis not present

## 2024-04-19 DIAGNOSIS — R4689 Other symptoms and signs involving appearance and behavior: Secondary | ICD-10-CM

## 2024-04-19 DIAGNOSIS — Z7984 Long term (current) use of oral hypoglycemic drugs: Secondary | ICD-10-CM

## 2024-04-19 LAB — HEMOGLOBIN A1C: Hgb A1c MFr Bld: 11.3 % — ABNORMAL HIGH (ref 4.6–6.5)

## 2024-04-19 LAB — CBC WITH DIFFERENTIAL/PLATELET
Basophils Absolute: 0.1 10*3/uL (ref 0.0–0.1)
Basophils Relative: 1.1 % (ref 0.0–3.0)
Eosinophils Absolute: 0.4 10*3/uL (ref 0.0–0.7)
Eosinophils Relative: 5.5 % — ABNORMAL HIGH (ref 0.0–5.0)
HCT: 40.6 % (ref 36.0–46.0)
Hemoglobin: 13.7 g/dL (ref 12.0–15.0)
Lymphocytes Relative: 35.1 % (ref 12.0–46.0)
Lymphs Abs: 2.5 10*3/uL (ref 0.7–4.0)
MCHC: 33.7 g/dL (ref 30.0–36.0)
MCV: 86.9 fl (ref 78.0–100.0)
Monocytes Absolute: 0.5 10*3/uL (ref 0.1–1.0)
Monocytes Relative: 6.3 % (ref 3.0–12.0)
Neutro Abs: 3.8 10*3/uL (ref 1.4–7.7)
Neutrophils Relative %: 52 % (ref 43.0–77.0)
Platelets: 335 10*3/uL (ref 150.0–400.0)
RBC: 4.67 Mil/uL (ref 3.87–5.11)
RDW: 12.5 % (ref 11.5–15.5)
WBC: 7.2 10*3/uL (ref 4.0–10.5)

## 2024-04-19 LAB — LIPID PANEL
Cholesterol: 222 mg/dL — ABNORMAL HIGH (ref 0–200)
HDL: 43.6 mg/dL (ref >39.00)
LDL Cholesterol: 140 mg/dL — ABNORMAL HIGH (ref 0–99)
NonHDL: 178.16
Total CHOL/HDL Ratio: 5
Triglycerides: 190 mg/dL — ABNORMAL HIGH (ref 0.0–149.0)
VLDL: 38 mg/dL (ref 0.0–40.0)

## 2024-04-19 LAB — COMPREHENSIVE METABOLIC PANEL WITH GFR
ALT: 25 U/L (ref 0–35)
AST: 21 U/L (ref 0–37)
Albumin: 4.3 g/dL (ref 3.5–5.2)
Alkaline Phosphatase: 63 U/L (ref 39–117)
BUN: 19 mg/dL (ref 6–23)
CO2: 27 meq/L (ref 19–32)
Calcium: 9.9 mg/dL (ref 8.4–10.5)
Chloride: 100 meq/L (ref 96–112)
Creatinine, Ser: 1.01 mg/dL (ref 0.40–1.20)
GFR: 56.17 mL/min — ABNORMAL LOW (ref >60.00)
Glucose, Bld: 326 mg/dL — ABNORMAL HIGH (ref 70–99)
Potassium: 4.8 meq/L (ref 3.5–5.1)
Sodium: 136 meq/L (ref 135–145)
Total Bilirubin: 0.6 mg/dL (ref 0.2–1.2)
Total Protein: 7.3 g/dL (ref 6.0–8.3)

## 2024-04-19 MED ORDER — GLIPIZIDE ER 5 MG PO TB24: 5.0000 mg | ORAL_TABLET | Freq: Every day | ORAL | 0 refills | Status: DC

## 2024-04-19 MED ORDER — ATORVASTATIN CALCIUM 20 MG PO TABS: 20.0000 mg | ORAL_TABLET | Freq: Every day | ORAL | 1 refills | Status: DC

## 2024-04-19 MED ORDER — TRAMADOL HCL 50 MG PO TABS: 50.0000 mg | ORAL_TABLET | Freq: Four times a day (QID) | ORAL | 0 refills | Status: AC | PRN

## 2024-04-19 MED ORDER — VALSARTAN 80 MG PO TABS: 80.0000 mg | ORAL_TABLET | Freq: Every day | ORAL | 0 refills | Status: DC

## 2024-04-19 MED ORDER — OMEPRAZOLE 20 MG PO CPDR: 20.0000 mg | DELAYED_RELEASE_CAPSULE | Freq: Every day | ORAL | 3 refills | Status: DC

## 2024-04-19 MED ORDER — HYDROCHLOROTHIAZIDE 25 MG PO TABS: 25.0000 mg | ORAL_TABLET | Freq: Every day | ORAL | 0 refills | Status: DC

## 2024-04-19 NOTE — Patient Instructions (Signed)
 Because your pulse is "swinging" and can be low, I am hesitant to re-start the Toprol for you. This can lower your pulse. I want to put you on a different medication for your blood pressure that is better for your kidneys with your diabetes. It is called Valsartan 80 mg- take this once a day in addition to your hydrochlorothiazide  25 mg daily; please follow up in 1 week since you are leaving again in 2 weeks.

## 2024-04-19 NOTE — Progress Notes (Signed)
 Morgan Ruiz is a 73 y.o. female with the following history as recorded in EpicCare:  Patient Active Problem List   Diagnosis Date Noted   Vitamin D  deficiency 04/08/2020   Epigastric discomfort 01/25/2019   History of peptic ulcer disease 01/25/2019   Diabetes mellitus without complication (HCC) 01/24/2016   Hypertension 01/24/2016   Hyperlipidemia 01/24/2016    Current Outpatient Medications  Medication Sig Dispense Refill   Cholecalciferol (VITAMIN D3 PO) Take 250 mcg by mouth daily.     cyclobenzaprine  (FLEXERIL ) 10 MG tablet Take 0.5-1 tablets (5-10 mg total) by mouth 3 (three) times daily as needed. 60 tablet 0   valsartan (DIOVAN) 80 MG tablet Take 1 tablet (80 mg total) by mouth daily. 90 tablet 0   atenolol  (TENORMIN ) 25 MG tablet TAKE 1 TABLET BY MOUTH 2 TIMES A DAY, VISIT REQUIRED FOR FURTHER REFILLS (Patient not taking: Reported on 04/19/2024) 30 tablet 0   atorvastatin  (LIPITOR) 20 MG tablet Take 1 tablet (20 mg total) by mouth daily. 90 tablet 1   glipiZIDE  (GLUCOTROL  XL) 5 MG 24 hr tablet Take 1 tablet (5 mg total) by mouth daily with breakfast. 90 tablet 0   hydrochlorothiazide  (HYDRODIURIL ) 25 MG tablet Take 1 tablet (25 mg total) by mouth daily. Must make appointment with PCP for future refills. 90 tablet 0   meloxicam  (MOBIC ) 15 MG tablet Take 1 tablet (15 mg total) by mouth daily. (Patient not taking: Reported on 04/19/2024) 30 tablet 0   omeprazole  (PRILOSEC) 20 MG capsule Take 1 capsule (20 mg total) by mouth daily. 90 capsule 3   traMADol  (ULTRAM ) 50 MG tablet Take 1-2 tablets (50-100 mg total) by mouth 4 (four) times daily as needed. 30 tablet 0   No current facility-administered medications for this visit.    Allergies: Acetaminophen , Hydrocodone, and Vicodin [hydrocodone-acetaminophen ]  Past Medical History:  Diagnosis Date   Diabetes mellitus without complication (HCC)    Hyperlipidemia    Hypertension     No past surgical history on file.  Family  History  Problem Relation Age of Onset   Diabetes type II Mother    Heart attack Mother    Liver cancer Father    Heart attack Brother     Social History   Tobacco Use   Smoking status: Former    Current packs/day: 0.00    Types: Cigarettes    Quit date: 11/21/2012    Years since quitting: 11.4   Smokeless tobacco: Never   Tobacco comments:    social smoker  Substance Use Topics   Alcohol use: No    Alcohol/week: 0.0 standard drinks of alcohol    Subjective:   Patient has not been seen in almost 1 year- notes she has not been on her blood pressure medication for "months." Has been taking a "natural" supplement for her diabetes from [place];  Had called yesterday with concerns that her blood pressure was elevated and was fluctuating yesterday; was recommended to go to ER yesterday and had initially agreed but changed her mind yesterday; She is asking to get refills and labs updated today; notes that she will be leaving for [place] again on June 10; Denies any chest pain, shortness of breath, blurred vision or headache.      Objective:  Vitals:   04/19/24 0929  BP: (!) 164/92  Pulse: 86  SpO2: 96%  Weight: 182 lb 12.8 oz (82.9 kg)  Height: 5\' 7"  (1.702 m)    General: Well developed, well nourished, in no  acute distress  Skin : Warm and dry.  Head: Normocephalic and atraumatic  Eyes: Sclera and conjunctiva clear; pupils round and reactive to light; extraocular movements intact  Ears: External normal; canals clear; tympanic membranes normal  Oropharynx: Pink, supple. No suspicious lesions  Neck: Supple without thyromegaly, adenopathy  Lungs: Respirations unlabored; clear to auscultation bilaterally without wheeze, rales, rhonchi  CVS exam: normal rate and regular rhythm.  Neurologic: Alert and oriented; speech intact; face symmetrical; moves all extremities well; CNII-XII intact without focal deficit   Assessment:  1. Primary hypertension   2. Pulse irregularity   3.  Hyperlipidemia, unspecified hyperlipidemia type   4. Diabetes mellitus without complication (HCC)   5. Non-compliant behavior     Plan:  & 2. EKG shows sinus bradycardia; patient's pulse when checked in our office on arrival was in the upper 80s; patient does note that she see her pulse "fluctuate" and has done so for many years; as a result, do not feel comfortable re-starting beta blocker; restart hydrochlorothiazide  and change to Valsartan 80 mg; check CBC, CMP today; follow up in 1 week since patient will be leaving for [place] again on June 10;  3.   Check lipid panel; refill updated; 4.   Check Hgba1c; in the past, patient has only been willing to take Glucotrol  XL 5 mg daily; she notes she is on a "natural diabetes supplement" at this time;  5.   Refill on Tramadol  #30; 6.   Stressed again the need to be seen for regular follow ups; discussed that chronic diseases need regular follow up to prevent complications;   No follow-ups on file.  Orders Placed This Encounter  Procedures   CBC with Differential/Platelet   Comp Met (CMET)   Lipid panel   Hemoglobin A1c   EKG 12-Lead    Requested Prescriptions   Signed Prescriptions Disp Refills   traMADol  (ULTRAM ) 50 MG tablet 30 tablet 0    Sig: Take 1-2 tablets (50-100 mg total) by mouth 4 (four) times daily as needed.   omeprazole  (PRILOSEC) 20 MG capsule 90 capsule 3    Sig: Take 1 capsule (20 mg total) by mouth daily.   hydrochlorothiazide  (HYDRODIURIL ) 25 MG tablet 90 tablet 0    Sig: Take 1 tablet (25 mg total) by mouth daily. Must make appointment with PCP for future refills.   glipiZIDE  (GLUCOTROL  XL) 5 MG 24 hr tablet 90 tablet 0    Sig: Take 1 tablet (5 mg total) by mouth daily with breakfast.   atorvastatin  (LIPITOR) 20 MG tablet 90 tablet 1    Sig: Take 1 tablet (20 mg total) by mouth daily.   valsartan (DIOVAN) 80 MG tablet 90 tablet 0    Sig: Take 1 tablet (80 mg total) by mouth daily.

## 2024-04-22 ENCOUNTER — Ambulatory Visit: Payer: Self-pay | Admitting: Family

## 2024-04-26 ENCOUNTER — Encounter: Payer: Self-pay | Admitting: Family

## 2024-04-26 ENCOUNTER — Ambulatory Visit: Admitting: Family

## 2024-04-26 VITALS — BP 146/80 | HR 60 | Ht 67.0 in | Wt 182.0 lb

## 2024-04-26 DIAGNOSIS — E119 Type 2 diabetes mellitus without complications: Secondary | ICD-10-CM

## 2024-04-26 DIAGNOSIS — M51379 Other intervertebral disc degeneration, lumbosacral region without mention of lumbar back pain or lower extremity pain: Secondary | ICD-10-CM

## 2024-04-26 DIAGNOSIS — Z7984 Long term (current) use of oral hypoglycemic drugs: Secondary | ICD-10-CM

## 2024-04-26 DIAGNOSIS — I10 Essential (primary) hypertension: Secondary | ICD-10-CM | POA: Diagnosis not present

## 2024-04-26 DIAGNOSIS — I499 Cardiac arrhythmia, unspecified: Secondary | ICD-10-CM | POA: Diagnosis not present

## 2024-04-26 DIAGNOSIS — R4689 Other symptoms and signs involving appearance and behavior: Secondary | ICD-10-CM

## 2024-04-26 MED ORDER — CYCLOBENZAPRINE HCL 10 MG PO TABS
5.0000 mg | ORAL_TABLET | Freq: Three times a day (TID) | ORAL | 0 refills | Status: AC | PRN
Start: 2024-04-26 — End: ?

## 2024-04-26 MED ORDER — ATENOLOL 25 MG PO TABS: 25.0000 mg | ORAL_TABLET | Freq: Two times a day (BID) | ORAL | 0 refills | Status: DC

## 2024-04-26 NOTE — Patient Instructions (Signed)
 Since you are leaving for [place] next week, you will need to make sure you see your provider in [place] if you don't feel well.   You need to be checking in every 3 months- either here or in [place]. Please take your medications daily as prescribed.

## 2024-04-26 NOTE — Progress Notes (Signed)
 Morgan Ruiz is a 73 y.o. female with the following history as recorded in EpicCare:  Patient Active Problem List   Diagnosis Date Noted   Vitamin D  deficiency 04/08/2020   Epigastric discomfort 01/25/2019   History of peptic ulcer disease 01/25/2019   Diabetes mellitus without complication (HCC) 01/24/2016   Hypertension 01/24/2016   Hyperlipidemia 01/24/2016    Current Outpatient Medications  Medication Sig Dispense Refill   atorvastatin  (LIPITOR) 20 MG tablet Take 1 tablet (20 mg total) by mouth daily. 90 tablet 1   Cholecalciferol (VITAMIN D3 PO) Take 250 mcg by mouth daily.     glipiZIDE  (GLUCOTROL  XL) 5 MG 24 hr tablet Take 1 tablet (5 mg total) by mouth daily with breakfast. 90 tablet 0   hydrochlorothiazide  (HYDRODIURIL ) 25 MG tablet Take 1 tablet (25 mg total) by mouth daily. Must make appointment with PCP for future refills. 90 tablet 0   omeprazole  (PRILOSEC) 20 MG capsule Take 1 capsule (20 mg total) by mouth daily. 90 capsule 3   traMADol  (ULTRAM ) 50 MG tablet Take 1-2 tablets (50-100 mg total) by mouth 4 (four) times daily as needed. 30 tablet 0   atenolol  (TENORMIN ) 25 MG tablet Take 1 tablet (25 mg total) by mouth 2 (two) times daily. 180 tablet 0   cyclobenzaprine  (FLEXERIL ) 10 MG tablet Take 0.5-1 tablets (5-10 mg total) by mouth 3 (three) times daily as needed. 60 tablet 0   meloxicam  (MOBIC ) 15 MG tablet Take 1 tablet (15 mg total) by mouth daily. (Patient not taking: Reported on 04/26/2024) 30 tablet 0   No current facility-administered medications for this visit.    Allergies: Acetaminophen , Hydrocodone, and Vicodin [hydrocodone-acetaminophen ]  Past Medical History:  Diagnosis Date   Diabetes mellitus without complication (HCC)    Hyperlipidemia    Hypertension     No past surgical history on file.  Family History  Problem Relation Age of Onset   Diabetes type II Mother    Heart attack Mother    Liver cancer Father    Heart attack Brother     Social  History   Tobacco Use   Smoking status: Former    Current packs/day: 0.00    Types: Cigarettes    Quit date: 11/21/2012    Years since quitting: 11.4   Smokeless tobacco: Never   Tobacco comments:    social smoker  Substance Use Topics   Alcohol use: No    Alcohol/week: 0.0 standard drinks of alcohol    Subjective:   Follow up on hypertension and Type 2 Diabetes; patient has been out of medication for months; she has missed multiple appointments in the past year and not responded to multiple requests for office visits.   Patient notes she did not like the Valsartan - felt it dropped her blood pressure too low and opted to go back on her Atenolol ;   She will be leaving for [place] next Tuesday and will be gone for the next 3 months;     Objective:  Vitals:   04/26/24 0951 04/26/24 1659  BP: (!) 146/80 (!) 146/80  Pulse: 60   SpO2: 97%   Weight: 182 lb (82.6 kg)   Height: 5\' 7"  (1.702 m)     General: Well developed, well nourished, in no acute distress  Skin : Warm and dry.  Head: Normocephalic and atraumatic  Lungs: Respirations unlabored; clear to auscultation bilaterally without wheeze, rales, rhonchi  CVS exam: normal rate and regular rhythm.  Neurologic: Alert and oriented; speech intact;  face symmetrical; moves all extremities well; CNII-XII intact without focal deficit   Assessment:  1. Primary hypertension   2. Irregular heart rate   3. DDD (degenerative disc disease), lumbosacral   4. Type 2 diabetes mellitus without complication, without long-term current use of insulin (HCC)   5. Non-compliant behavior     Plan:  & 2. Patient prefers to be on Atenolol  and hydrochlorothiazide ; refills updated; referral to cardiology to see them when she returns from [place].  3.   Refill given on Flexeril ; 4.   Patient is adamant she will only take her Gluctrol XL; she will be leaving for [place] on Tuesday for 3 months- she will see her provider there if she has problems.  5.    Reviewed with patient that numerous attempts had been made to let her know she was overdue for appointments; she needed to do regular follow ups here to prevent complications;   Return in about 3 months (around 07/27/2024).  Orders Placed This Encounter  Procedures   Ambulatory referral to Cardiology    Referral Priority:   Routine    Referral Type:   Consultation    Referral Reason:   Specialty Services Required    Number of Visits Requested:   1    Requested Prescriptions   Signed Prescriptions Disp Refills   cyclobenzaprine  (FLEXERIL ) 10 MG tablet 60 tablet 0    Sig: Take 0.5-1 tablets (5-10 mg total) by mouth 3 (three) times daily as needed.   atenolol  (TENORMIN ) 25 MG tablet 180 tablet 0    Sig: Take 1 tablet (25 mg total) by mouth 2 (two) times daily.

## 2024-07-14 ENCOUNTER — Other Ambulatory Visit: Payer: Self-pay | Admitting: Family

## 2024-07-15 ENCOUNTER — Ambulatory Visit: Payer: Self-pay

## 2024-07-15 NOTE — Telephone Encounter (Signed)
 FYI Only or Action Required?: FYI only for provider.  Patient was last seen in primary care on 04/26/2024 by Jason Leita Repine, FNP.  Called Nurse Triage reporting Shortness of Breath and Cough.  Symptoms began several days ago.  Interventions attempted: Rest, hydration, or home remedies.  Symptoms are: unchanged.  Triage Disposition: See HCP Within 4 Hours (Or PCP Triage)  Patient/caregiver understands and will follow disposition?: Yes        Copied from CRM #[id number]. Topic: Clinical - Red Word Triage >> Jul 15, 2024  4:29 PM Dedra B wrote: Red Word that prompted transfer to Nurse Triage: Pt having painful cough and difficulty breathing. Warm transfer to [facility] nurse triage Reason for Disposition  [1] MILD difficulty breathing (e.g., minimal/no SOB at rest, SOB with walking, pulse < 100) AND [2] still present when not coughing  Answer Assessment - Initial Assessment Questions 1. ONSET: When did the cough begin?      Several days 2. SEVERITY: How bad is the cough today?      bad 3. SPUTUM: Describe the color of your sputum (e.g., none, dry cough; clear, white, yellow, green)     dry 4. HEMOPTYSIS: Are you coughing up any blood? If Yes, ask: How much? (e.g., flecks, streaks, tablespoons, etc.)     denies 5. DIFFICULTY BREATHING: Are you having difficulty breathing? If Yes, ask: How bad is it? (e.g., mild, moderate, severe)      Denies Triager does appreciate audible SOB with coughing during call. Pt is speaking in partial-full sentences.  6. FEVER: Do you have a fever? If Yes, ask: What is your temperature, how was it measured, and when did it start?     denies 7. CARDIAC HISTORY: Do you have any history of heart disease? (e.g., heart attack, congestive heart failure)      HTN per chart 8. LUNG HISTORY: Do you have any history of lung disease?  (e.g., pulmonary embolus, asthma, emphysema)     denies 9. PE RISK FACTORS: Do you have a history of  blood clots? (or: recent major surgery, recent prolonged travel, bedridden)     denies 10. OTHER SYMPTOMS: Do you have any other symptoms? (e.g., runny nose, wheezing, chest pain)       Denies Pt endorses mouth breathing d/t nasal congestion 11. PREGNANCY: Is there any chance you are pregnant? When was your last menstrual period?       N/a 12. TRAVEL: Have you traveled out of the country in the last month? (e.g., travel history, exposures)       Recent travel to [place] ?    Triage was limited d/t pt accent and poor historian.  Protocols used: Cough - Acute Non-Productive-A-AH

## 2024-07-16 ENCOUNTER — Encounter (HOSPITAL_COMMUNITY): Payer: Self-pay

## 2024-07-16 ENCOUNTER — Ambulatory Visit (INDEPENDENT_AMBULATORY_CARE_PROVIDER_SITE_OTHER): Admitting: Family

## 2024-07-16 ENCOUNTER — Ambulatory Visit (HOSPITAL_BASED_OUTPATIENT_CLINIC_OR_DEPARTMENT_OTHER)
Admission: RE | Admit: 2024-07-16 | Discharge: 2024-07-16 | Disposition: A | Discharge status: Home / Self Care | Source: Ambulatory Visit | Attending: Family | Admitting: Family

## 2024-07-16 ENCOUNTER — Ambulatory Visit: Payer: Self-pay | Admitting: Family

## 2024-07-16 ENCOUNTER — Emergency Department (HOSPITAL_COMMUNITY)

## 2024-07-16 ENCOUNTER — Other Ambulatory Visit: Payer: Self-pay

## 2024-07-16 ENCOUNTER — Emergency Department (HOSPITAL_COMMUNITY)
Admission: EM | Admit: 2024-07-16 | Discharge: 2024-07-17 | Disposition: A | Discharge status: Home / Self Care | Attending: Emergency Medicine | Admitting: Emergency Medicine

## 2024-07-16 ENCOUNTER — Encounter: Payer: Self-pay | Admitting: Family

## 2024-07-16 VITALS — BP 108/64 | HR 69 | Temp 98.5°F | Ht 67.0 in | Wt 177.4 lb

## 2024-07-16 DIAGNOSIS — R059 Cough, unspecified: Secondary | ICD-10-CM | POA: Insufficient documentation

## 2024-07-16 DIAGNOSIS — Z9049 Acquired absence of other specified parts of digestive tract: Secondary | ICD-10-CM | POA: Diagnosis not present

## 2024-07-16 DIAGNOSIS — Z87891 Personal history of nicotine dependence: Secondary | ICD-10-CM | POA: Insufficient documentation

## 2024-07-16 DIAGNOSIS — R0602 Shortness of breath: Secondary | ICD-10-CM

## 2024-07-16 DIAGNOSIS — J101 Influenza due to other identified influenza virus with other respiratory manifestations: Secondary | ICD-10-CM | POA: Insufficient documentation

## 2024-07-16 DIAGNOSIS — J111 Influenza due to unidentified influenza virus with other respiratory manifestations: Secondary | ICD-10-CM

## 2024-07-16 DIAGNOSIS — I700 Atherosclerosis of aorta: Secondary | ICD-10-CM | POA: Diagnosis not present

## 2024-07-16 DIAGNOSIS — I10 Essential (primary) hypertension: Secondary | ICD-10-CM | POA: Insufficient documentation

## 2024-07-16 DIAGNOSIS — R918 Other nonspecific abnormal finding of lung field: Secondary | ICD-10-CM | POA: Diagnosis not present

## 2024-07-16 DIAGNOSIS — R058 Other specified cough: Secondary | ICD-10-CM

## 2024-07-16 DIAGNOSIS — E119 Type 2 diabetes mellitus without complications: Secondary | ICD-10-CM | POA: Insufficient documentation

## 2024-07-16 LAB — CBC WITH DIFFERENTIAL/PLATELET
Abs Immature Granulocytes: 0.02 K/uL (ref 0.00–0.07)
Basophils Absolute: 0 K/uL (ref 0.0–0.1)
Basophils Relative: 1 %
Eosinophils Absolute: 0 K/uL (ref 0.0–0.5)
Eosinophils Relative: 0 %
HCT: 40.8 % (ref 36.0–46.0)
Hemoglobin: 13.2 g/dL (ref 12.0–15.0)
Immature Granulocytes: 0 %
Lymphocytes Relative: 35 %
Lymphs Abs: 1.9 K/uL (ref 0.7–4.0)
MCH: 29.2 pg (ref 26.0–34.0)
MCHC: 32.4 g/dL (ref 30.0–36.0)
MCV: 90.3 fL (ref 80.0–100.0)
Monocytes Absolute: 0.7 K/uL (ref 0.1–1.0)
Monocytes Relative: 13 %
Neutro Abs: 2.6 K/uL (ref 1.7–7.7)
Neutrophils Relative %: 51 %
Platelets: 308 K/uL (ref 150–400)
RBC: 4.52 MIL/uL (ref 3.87–5.11)
RDW: 12.8 % (ref 11.5–15.5)
WBC: 5.2 K/uL (ref 4.0–10.5)
nRBC: 0 % (ref 0.0–0.2)

## 2024-07-16 LAB — COMPREHENSIVE METABOLIC PANEL WITH GFR
ALT: 39 U/L (ref 0–44)
AST: 58 U/L — ABNORMAL HIGH (ref 15–41)
Albumin: 4.1 g/dL (ref 3.5–5.0)
Alkaline Phosphatase: 47 U/L (ref 38–126)
Anion gap: 17 — ABNORMAL HIGH (ref 5–15)
BUN: 26 mg/dL — ABNORMAL HIGH (ref 8–23)
CO2: 22 mmol/L (ref 22–32)
Calcium: 9.4 mg/dL (ref 8.9–10.3)
Chloride: 93 mmol/L — ABNORMAL LOW (ref 98–111)
Creatinine, Ser: 1.28 mg/dL — ABNORMAL HIGH (ref 0.44–1.00)
GFR, Estimated: 45 mL/min — ABNORMAL LOW (ref >60)
Glucose, Bld: 96 mg/dL (ref 70–99)
Potassium: 3.7 mmol/L (ref 3.5–5.1)
Sodium: 133 mmol/L — ABNORMAL LOW (ref 135–145)
Total Bilirubin: 0.5 mg/dL (ref 0.0–1.2)
Total Protein: 7.4 g/dL (ref 6.5–8.1)

## 2024-07-16 LAB — I-STAT CHEM 8, ED
BUN: 29 mg/dL — ABNORMAL HIGH (ref 8–23)
Calcium, Ion: 0.91 mmol/L — ABNORMAL LOW (ref 1.15–1.40)
Chloride: 101 mmol/L (ref 98–111)
Creatinine, Ser: 1.5 mg/dL — ABNORMAL HIGH (ref 0.44–1.00)
Glucose, Bld: 166 mg/dL — ABNORMAL HIGH (ref 70–99)
HCT: 38 % (ref 36.0–46.0)
Hemoglobin: 12.9 g/dL (ref 12.0–15.0)
Potassium: 3.6 mmol/L (ref 3.5–5.1)
Sodium: 131 mmol/L — ABNORMAL LOW (ref 135–145)
TCO2: 21 mmol/L — ABNORMAL LOW (ref 22–32)

## 2024-07-16 LAB — RESP PANEL BY RT-PCR (RSV, FLU A&B, COVID)  RVPGX2
Influenza A by PCR: POSITIVE — AB
Influenza B by PCR: NEGATIVE
Resp Syncytial Virus by PCR: NEGATIVE
SARS Coronavirus 2 by RT PCR: NEGATIVE

## 2024-07-16 MED ORDER — IOHEXOL 350 MG/ML SOLN
60.0000 mL | Freq: Once | INTRAVENOUS | Status: AC | PRN
  Administered 2024-07-16: 60 mL via INTRAVENOUS

## 2024-07-16 MED ORDER — DOXYCYCLINE HYCLATE 100 MG PO TABS: 100.0000 mg | ORAL_TABLET | Freq: Two times a day (BID) | ORAL | 0 refills | Status: DC

## 2024-07-16 MED ORDER — GLIPIZIDE ER 5 MG PO TB24: 5.0000 mg | ORAL_TABLET | Freq: Every day | ORAL | 0 refills | Status: DC

## 2024-07-16 MED ORDER — PREDNISONE 20 MG PO TABS: 20.0000 mg | ORAL_TABLET | Freq: Every day | ORAL | 0 refills | Status: DC

## 2024-07-16 MED ORDER — HYDROCHLOROTHIAZIDE 25 MG PO TABS: 25.0000 mg | ORAL_TABLET | Freq: Every day | ORAL | 0 refills | Status: DC

## 2024-07-16 NOTE — Telephone Encounter (Signed)
 Appt scheduled today

## 2024-07-16 NOTE — ED Notes (Signed)
 Pt walked without complaints, O2 was between 96-99. PA notified.

## 2024-07-16 NOTE — Addendum Note (Signed)
 Addended by: TRUDY CURVIN RAMAN on: 07/16/2024 04:02 PM   Modules accepted: Orders

## 2024-07-16 NOTE — Patient Instructions (Signed)
 We will call you this afternoon once we get your CXR results and labs back. I appreciate that you do not want to go to the ER but if your symptoms worsen, you will need to go directly to the ER. We will know better what type of follow up is needed after we get your CXR results.

## 2024-07-16 NOTE — Progress Notes (Signed)
 Morgan Ruiz is a 73 y.o. female with the following history as recorded in EpicCare:  Patient Active Problem List   Diagnosis Date Noted   Vitamin D  deficiency 04/08/2020   Epigastric discomfort 01/25/2019   History of peptic ulcer disease 01/25/2019   Diabetes mellitus without complication (HCC) 01/24/2016   Hypertension 01/24/2016   Hyperlipidemia 01/24/2016    Current Outpatient Medications  Medication Sig Dispense Refill   atenolol  (TENORMIN ) 25 MG tablet Take 1 tablet (25 mg total) by mouth 2 (two) times daily. 180 tablet 0   atorvastatin  (LIPITOR) 20 MG tablet Take 1 tablet (20 mg total) by mouth daily. 90 tablet 1   Cholecalciferol (VITAMIN D3 PO) Take 250 mcg by mouth daily.     cyclobenzaprine  (FLEXERIL ) 10 MG tablet Take 0.5-1 tablets (5-10 mg total) by mouth 3 (three) times daily as needed. 60 tablet 0   doxycycline  (VIBRA -TABS) 100 MG tablet Take 1 tablet (100 mg total) by mouth 2 (two) times daily. 14 tablet 0   omeprazole  (PRILOSEC) 20 MG capsule Take 1 capsule (20 mg total) by mouth daily. 90 capsule 3   predniSONE  (DELTASONE ) 20 MG tablet Take 1 tablet (20 mg total) by mouth daily with breakfast. 5 tablet 0   traMADol  (ULTRAM ) 50 MG tablet Take 1-2 tablets (50-100 mg total) by mouth 4 (four) times daily as needed. 30 tablet 0   glipiZIDE  (GLUCOTROL  XL) 5 MG 24 hr tablet Take 1 tablet (5 mg total) by mouth daily with breakfast. 90 tablet 0   hydrochlorothiazide  (HYDRODIURIL ) 25 MG tablet Take 1 tablet (25 mg total) by mouth daily. 90 tablet 0   meloxicam  (MOBIC ) 15 MG tablet Take 1 tablet (15 mg total) by mouth daily. (Patient not taking: Reported on 07/16/2024) 30 tablet 0   No current facility-administered medications for this visit.    Allergies: Acetaminophen , Hydrocodone, and Vicodin [hydrocodone-acetaminophen ]  Past Medical History:  Diagnosis Date   Diabetes mellitus without complication (HCC)    Hyperlipidemia    Hypertension     No past surgical history on  file.  Family History  Problem Relation Age of Onset   Diabetes type II Mother    Heart attack Mother    Liver cancer Father    Heart attack Brother     Social History   Tobacco Use   Smoking status: Former    Current packs/day: 0.00    Types: Cigarettes    Quit date: 11/21/2012    Years since quitting: 11.6   Smokeless tobacco: Never   Tobacco comments:    social smoker  Substance Use Topics   Alcohol use: No    Alcohol/week: 0.0 standard drinks of alcohol    Subjective:   4 day history of cough/ congestion- does feel that symptoms are getting worse. Recently returned from [place] and notes that flight home was very stressful; No fever but feels that is hard to take a deep breath due to cough;  Patient is adamant that she will not consider going to the ER or wearing any type of oxygen;   Objective:  Vitals:   07/16/24 1121 07/16/24 1217  BP: 108/64   Pulse: 69   Temp: 98.5 F (36.9 C)   TempSrc: Oral   SpO2: (!) 86% 91%  Weight: 177 lb 6.4 oz (80.5 kg)   Height: 5' 7 (1.702 m)     General: Well developed, well nourished, in no acute distress  Skin : Warm and dry.  Head: Normocephalic and atraumatic  Eyes:  Sclera and conjunctiva clear; pupils round and reactive to light; extraocular movements intact  Ears: External normal; canals clear; tympanic membranes normal  Oropharynx: Pink, supple. No suspicious lesions  Neck: Supple without thyromegaly, adenopathy  Lungs: Respirations unlabored; clear to auscultation bilaterally without wheeze, rales, rhonchi  CVS exam: normal rate and regular rhythm.  Neurologic: Alert and oriented; speech intact; face symmetrical; moves all extremities well; CNII-XII intact without focal deficit   Assessment:  1. Shortness of breath   2. Cough, unspecified type     Plan:  Concern for pneumonia; I repeatedly discussed my concerns with patient about her symptoms/ low oxygen level but she adamantly opposed using any type of oxygen or  going to the ER; we did attempt to check her CBC, CMP today but she was too dehydrated and lab sticks were not successful; will update STAT CXR today;  I did go ahead and send in Rx for Doxycyline and Prednisone  for the patient and stressed need for her to work on increasing fluids today; We will call her as soon as the CXR results are back this afternoon- strict ER precautions are again discussed but patient notes she will manage her care at home.   No follow-ups on file.  Orders Placed This Encounter  Procedures   DG Chest 2 View    Standing Status:   Future    Number of Occurrences:   1    Expiration Date:   07/16/2025    Reason for Exam (SYMPTOM  OR DIAGNOSIS REQUIRED):   cough/ shortness of breath    Preferred imaging location?:   [facility]   CBC with Differential/Platelet   Comp Met (CMET)    Requested Prescriptions   Signed Prescriptions Disp Refills   glipiZIDE  (GLUCOTROL  XL) 5 MG 24 hr tablet 90 tablet 0    Sig: Take 1 tablet (5 mg total) by mouth daily with breakfast.   hydrochlorothiazide  (HYDRODIURIL ) 25 MG tablet 90 tablet 0    Sig: Take 1 tablet (25 mg total) by mouth daily.   doxycycline  (VIBRA -TABS) 100 MG tablet 14 tablet 0    Sig: Take 1 tablet (100 mg total) by mouth 2 (two) times daily.   predniSONE  (DELTASONE ) 20 MG tablet 5 tablet 0    Sig: Take 1 tablet (20 mg total) by mouth daily with breakfast.

## 2024-07-16 NOTE — ED Provider Triage Note (Addendum)
 Emergency Medicine Provider Triage Evaluation Note  Morgan Ruiz , a 73 y.o. female  was evaluated in triage.  Pt complains of dry cough since Sunday. No congestion, CP  Does not wear O2 at home. Mild shob with exertion  Saw PCP today who recommended ED evaluation for hypoxia of 86%. CXR wo signs of infection nor fluid. Took prednisone  and doxy from PCP prescribed today. 91% RA with good pleth wave in triage  Returned from [place] last Thursday. No hx of blood clots, recent surgery, hemoptysis, unilateral pedal edema, exogenous hormones  Review of Systems  Positive: See hpi Negative:   Physical Exam  BP 110/66 (BP Location: Left Arm)   Pulse 72   Temp 98.1 F (36.7 C) (Oral)   Resp 16   SpO2 91%  Gen:   Awake, no distress   Resp:  Normal effort  MSK:   Moves extremities without difficulty  Other:  No pedal edema  Medical Decision Making  Medically screening exam initiated at 7:19 PM.  Appropriate orders placed.  Miray Mancino was informed that the remainder of the evaluation will be completed by another provider, this initial triage assessment does not replace that evaluation, and the importance of remaining in the ED until their evaluation is complete.  Labs, resp panel and CTPE ordered.   Minnie Tinnie BRAVO, PA 07/16/24 1925    Minnie Tinnie BRAVO, PA 07/16/24 [phone]

## 2024-07-16 NOTE — ED Notes (Signed)
 Patient was out of bed trying to get to purse to take medicine for diabetes, told her to remain in bed and not take any medications unless we give them to her or get approval from the MD.

## 2024-07-16 NOTE — ED Provider Notes (Signed)
 [place] EMERGENCY DEPARTMENT AT [facility] Provider Note   CSN: [id number] Arrival date & time: 07/16/24  1820     Patient presents with: Cough   Morgan Ruiz is a 73 y.o. female with past medical history of non-insulin-dependent T2DM, HTN, HLD presents emergency department for evaluation of nonproductive cough, mild shortness of breath with exertion since Sunday, 3 days ago.  Denies chest pain, congestion, fevers  Saw PCP today who recommended ED evaluation for hypoxia of 86% on room air.  Does not normally wear oxygen at home.  PCP originally recommended ED evaluation for hypoxia however patient adamantly refused so prescribed prednisone  and doxycycline  for suspected pneumonia prior to chest x-ray completion. Took 1 dose of prednisone  and Doxy. Chest x-ray that showed no signs of infection nor effusion.   Of note, recently returned from [place] last Thursday.  Has no history of blood clots, recent surgery, hemoptysis, pedal edema, nor exogenous hormones    Cough      Prior to Admission medications   Medication Sig Start Date End Date Taking? Authorizing Provider  atenolol  (TENORMIN ) 25 MG tablet Take 1 tablet (25 mg total) by mouth 2 (two) times daily. 04/26/24   Jason Leita Repine, FNP  atorvastatin  (LIPITOR) 20 MG tablet Take 1 tablet (20 mg total) by mouth daily. 04/19/24   Jason Leita Repine, FNP  Cholecalciferol (VITAMIN D3 PO) Take 250 mcg by mouth daily.    [provider]  cyclobenzaprine  (FLEXERIL ) 10 MG tablet Take 0.5-1 tablets (5-10 mg total) by mouth 3 (three) times daily as needed. 04/26/24   Jason Leita Repine, FNP  doxycycline  (VIBRA -TABS) 100 MG tablet Take 1 tablet (100 mg total) by mouth 2 (two) times daily. 07/16/24   Jason Leita Repine, FNP  glipiZIDE  (GLUCOTROL  XL) 5 MG 24 hr tablet Take 1 tablet (5 mg total) by mouth daily with breakfast. 07/16/24   Jason Leita Repine, FNP  hydrochlorothiazide  (HYDRODIURIL ) 25 MG tablet  Take 1 tablet (25 mg total) by mouth daily. 07/16/24   Jason Leita Repine, FNP  meloxicam  (MOBIC ) 15 MG tablet Take 1 tablet (15 mg total) by mouth daily. Patient not taking: Reported on 07/16/2024 03/10/23   Jason Leita Repine, FNP  omeprazole  (PRILOSEC) 20 MG capsule Take 1 capsule (20 mg total) by mouth daily. 04/19/24   Jason Leita Repine, FNP  predniSONE  (DELTASONE ) 20 MG tablet Take 1 tablet (20 mg total) by mouth daily with breakfast. 07/16/24   Jason Leita Repine, FNP  traMADol  (ULTRAM ) 50 MG tablet Take 1-2 tablets (50-100 mg total) by mouth 4 (four) times daily as needed. 04/19/24   Jason Leita Repine, FNP    Allergies: Acetaminophen , Hydrocodone, and Vicodin [hydrocodone-acetaminophen ]    Review of Systems  Respiratory:  Positive for cough.     Updated Vital Signs BP 123/65   Pulse 64   Temp 98 F (36.7 C)   Resp 19   SpO2 93%   Physical Exam Vitals and nursing note reviewed.  Constitutional:      General: She is not in acute distress.    Appearance: Normal appearance. She is not ill-appearing.  HENT:     Head: Normocephalic and atraumatic.  Eyes:     Conjunctiva/sclera: Conjunctivae normal.  Cardiovascular:     Rate and Rhythm: Normal rate.  Pulmonary:     Effort: Pulmonary effort is normal. No respiratory distress.     Breath sounds: Normal breath sounds.     Comments: Speaking in full complete sentences without difficulty.  Oxygen saturation 91-93% with good pleth wave on room air while patient is resting.  Ambulated and oxygen saturation 96-98%.  No complaints of shortness of breath or chest pain Musculoskeletal:     Right lower leg: No edema.     Left lower leg: No edema.  Skin:    Capillary Refill: Capillary refill takes less than 2 seconds.     Coloration: Skin is not jaundiced or pale.  Neurological:     Mental Status: She is alert and oriented to person, place, and time. Mental status is at baseline.     (all labs ordered are listed,  but only abnormal results are displayed) Labs Reviewed  RESP PANEL BY RT-PCR (RSV, FLU A&B, COVID)  RVPGX2 - Abnormal; Notable for the following components:      Result Value   Influenza A by PCR POSITIVE (*)    All other components within normal limits  COMPREHENSIVE METABOLIC PANEL WITH GFR - Abnormal; Notable for the following components:   Sodium 133 (*)    Chloride 93 (*)    BUN 26 (*)    Creatinine, Ser 1.28 (*)    AST 58 (*)    GFR, Estimated 45 (*)    Anion gap 17 (*)    All other components within normal limits  I-STAT CHEM 8, ED - Abnormal; Notable for the following components:   Sodium 131 (*)    BUN 29 (*)    Creatinine, Ser 1.50 (*)    Glucose, Bld 166 (*)    Calcium , Ion 0.91 (*)    TCO2 21 (*)    All other components within normal limits  CBC WITH DIFFERENTIAL/PLATELET    EKG: None  Radiology: DG Chest 2 View Result Date: 07/16/2024 CLINICAL DATA:  Cough, shortness of breath. EXAM: DG CHEST 2V COMPARISON:  December 17, 2019. FINDINGS: The heart size and mediastinal contours are within normal limits. Both lungs are clear. The visualized skeletal structures are unremarkable. IMPRESSION: No active cardiopulmonary disease. Electronically Signed   By: Lynwood Landy Raddle M.D.   On: 07/16/2024 12:43    Medications Ordered in the ED  iohexol  (OMNIPAQUE ) 350 MG/ML injection 60 mL (60 mLs Intravenous Contrast Given 07/16/24 2311)                                    Medical Decision Making Amount and/or Complexity of Data Reviewed Labs: ordered. Radiology: ordered.  Risk Prescription drug management.   Patient presents to the ED for concern of nonproductive cough, hypoxia, this involves an extensive number of treatment options, and is a complaint that carries with it a high risk of complications and morbidity.  The differential diagnosis includes COVID, flu, RSV, pneumonia, fluid overload, PE, COPD exacerbation   Co morbidities that complicate the patient  evaluation  Recent travel from [place] See HPI   Additional history obtained:  Additional history obtained from Nursing   External records from outside source obtained and reviewed including triage note, PCP note from today   Lab Tests:  I Ordered, and personally interpreted labs.  The pertinent results include:   NA 133 Creatinine 1.28 AST 58 Flu positive   Imaging Studies ordered:  I ordered imaging studies including CT PE Pending at signout   Cardiac Monitoring:  The patient was maintained on a cardiac monitor.     Problem List / ED Course:  Nonproductive cough Shortness of breath Lung sounds  CTAB with no wheezing.  Low suspicion for asthma or COPD exacerbation Does not appear fluid overloaded on exam with no rales nor pedal edema appreciated Chest x-ray obtained by PCP earlier did not show any consolidation, effusion and does not appear consistent with pneumonia Does not appear septic with no leukocytosis, fever, tachycardia Flu positive which may be contributing to hypoxia As she recently traveled from [place], obtain CT PE and ruled out PE as etiology of hypoxia Ambulated and saturating 96 8% on room air with no need for supplementation nor complaints of chest pain, shortness of breath. Low suspicion for ACS with no complaints of chest pain nor signs of fluid overload on exam.  EKG without T wave nor ST abnormalities Shared decision making is had with patient regarding admission for ops as she did desat in PCP office and was 91% in triage versus discharge home.  She wishes to be discharged.  Feel this is reasonable as she ambulated without hypoxia nor complaints.    Reevaluation:  After the interventions noted above, I reevaluated the patient and found that they have :stayed the same   Social Determinants of Health:  Former tobacco abuse Has PCP follow-up   Dispostion:  Dispo pending CT PE results and ambulation trial to see if she desats with  exertion.  If patient continues to desat and requires oxygen supplementation, may need to consider admission for hypoxia  Sign out to [facility] pending CTPE results, ambulation trial  Final diagnoses:  Influenza  Nonproductive cough    ED Discharge Orders     None        Minnie Tinnie BRAVO, PA 07/16/24 2359    Darra Fonda MATSU, MD 07/20/24 [phone]

## 2024-07-16 NOTE — Discharge Instructions (Signed)
 Thank for let us  evaluate you today.  Your CT scan did not show any evidence of PE.  You are positive for flu.  Other labs are unremarkable.  Please isolate yourself to avoid spreading flu.  You may use Tylenol , ibuprofen  intermittently every 8 hours as needed for fever greater than 100.4, body aches.  You may use cough drops, Chloraseptic spray found over-the-counter for cough, sore throat.  Return to emergency room if you experience intractable vomiting, causing you to be severely dehydrated, chest pain, shortness of breath significant worsening of symptoms

## 2024-07-17 ENCOUNTER — Other Ambulatory Visit: Payer: Self-pay | Admitting: Family

## 2024-07-23 ENCOUNTER — Ambulatory Visit: Payer: Self-pay

## 2024-07-23 NOTE — Telephone Encounter (Signed)
 This RN attempted to contact patient, no answer, left VM to return call.       Copied from CRM [id number]. Topic: Clinical - Red Word Triage >> Jul 23, 2024 11:48 AM Morgan Ruiz wrote: Red Word that prompted transfer to Nurse Triage: was seen in ED on 8/26 for bad cough, still having sxs and SOB >> Jul 23, 2024 12:02 PM Morgan Ruiz wrote: Pt called back in she was very upset, she says she was disconnected on. pT mentioned she was getting her lawyer involved. I tried to NT the pt she adv she was tired and she did bot want to speak to a nurse. Pt disconnected on me

## 2024-07-23 NOTE — Telephone Encounter (Signed)
 Called pt and left a VM informing pt that PCP recommends pt be seen sooner and to please give our office a call back to schedule an appointment.

## 2024-07-23 NOTE — Telephone Encounter (Signed)
 Reason for Disposition . [1] Caller demands to speak with the PCP AND [2] about sick adult (or sick caller)  Answer Assessment - Initial Assessment Questions 1. SITUATION:  Document reason for call.     Per CRM, PAS Morgan Ruiz spoke with patient and states Pt called back in she was very upset, she says she was disconnected on. Pt mentioned she was getting her lawyer involved. I tried to NT the pt she adv she was tired and she did bot want to speak to a nurse. Pt disconnected on me .   RN Muriel made an attempt to contact patient. Due to patient's refusal of nurse triage, routing to clinic.  Protocols used: Difficult Call-A-AH

## 2024-07-24 ENCOUNTER — Other Ambulatory Visit: Payer: Self-pay | Admitting: Family

## 2024-07-24 DIAGNOSIS — I10 Essential (primary) hypertension: Secondary | ICD-10-CM

## 2024-07-30 ENCOUNTER — Ambulatory Visit: Admitting: Family

## 2024-08-06 ENCOUNTER — Ambulatory Visit: Payer: Self-pay

## 2024-08-06 NOTE — Telephone Encounter (Signed)
 Called pt and left a VM letting pt know I was giving her a call back to check on her, advised pt to give the office a call back to schedule an appointment.

## 2024-08-06 NOTE — Telephone Encounter (Signed)
 FYI Only or Action Required?: FYI only for provider.  Patient was last seen in primary care on 07/16/2024 by Jason Leita Repine, FNP.  Called Nurse Triage reporting Fever and Back Pain.  Symptoms began unknown.  Interventions attempted: Other: unknown.  Symptoms are: reports fever and right side pain that goes to her back; unsure if improved or worsening.  Triage Disposition: Call PCP Now  Patient/caregiver understands and will follow disposition?: Yes               Reason for Disposition  [1] Angry or rude caller AND [2] doesn't respond to 5 minutes of triager counseling AND [3] sick adult (or caller)  Answer Assessment - Initial Assessment Questions 1. SITUATION:  Document reason for call.     Triage RN returning a call for Red word symptoms (pain and fever).  2. BACKGROUND: Document any background information (e.g., prior calls, known psychiatric history)     Prior calls, patient called in twice on 07/23/24 regarding illness. She refused nurse triage on 07/23/24 and the clinic reached out and left a voicemail for her to make an appointment. She called back in again today for symptoms.   3. ASSESSMENT: Document your nursing assessment.     Patient mentions she has had all time fever and mouth burning from fevers, as well as this hurt me in right side goes to back.  4. RESPONSE: Document what your response or recommendation was.     Recommended nurse triage and to schedule an appointment with her provider. She declined and said she will call back tomorrow and that she won't make any more appointments.    Patient states she is very upset. She refused RN's attempts to speak with her today about her symptoms. She states she was hung up on when she attempted to call in on 07/23/24 and then mentions she was scheduled for a wellness visit with Leita and that was not what she needed. She states she feels it is unprofessional and expresses her concern and disappointment that  she is not being listened to. Patient refused nurse triage and states we need to stop calling her and that she told the PAS agent that we shouldn't call back and she will call us  back when she is available tomorrow.  Protocols used: Difficult Call-A-AH

## 2024-08-06 NOTE — Telephone Encounter (Signed)
 1st attempt. No answer, left voicemail for patient to return call for nurse triage.      Copied from CRM [id number]. Topic: Clinical - Red Word Triage >> Aug 06, 2024  9:16 AM Deleta RAMAN wrote: Red Word that prompted transfer to Nurse Triage: pain in right side from front area to back. Sharp pain. Yesterday patient had a fever states coming and going. Also weakness.

## 2024-10-11 ENCOUNTER — Encounter: Payer: Self-pay | Admitting: *Deleted

## 2024-10-11 ENCOUNTER — Other Ambulatory Visit: Payer: Self-pay | Admitting: *Deleted

## 2024-10-11 MED ORDER — HYDROCHLOROTHIAZIDE 25 MG PO TABS: 25.0000 mg | ORAL_TABLET | Freq: Every day | ORAL | 0 refills | Status: AC

## 2024-10-11 MED ORDER — GLIPIZIDE ER 5 MG PO TB24: 5.0000 mg | ORAL_TABLET | Freq: Every day | ORAL | 0 refills | Status: AC

## 2024-10-11 NOTE — Telephone Encounter (Addendum)
 Arloa Prior [employer] faxed a refill request for glipizide  ER 5mg  and hydrochlorothiazide  25mg .  Both last filled 07/16/24.  Letter mailed out for patient to make [facility] appointment.

## 2024-10-15 ENCOUNTER — Encounter: Payer: Self-pay | Admitting: *Deleted

## 2024-10-15 NOTE — Progress Notes (Signed)
 Malayah Demuro                                          MRN: [id number]   10/15/2024   The VBCI Quality Team Specialist reviewed this patient medical record for the purposes of chart review for care gap closure. The following were reviewed: chart review for care gap closure-glycemic status assessment and kidney health evaluation for diabetes:eGFR  and uACR.    VBCI Quality Team

## 2024-10-16 ENCOUNTER — Telehealth: Payer: Self-pay | Admitting: Family

## 2024-10-16 NOTE — Telephone Encounter (Signed)
 Copied from CRM #[id number]. Topic: Medicare AWV >> Oct 16, 2024 10:34 AM Nathanel DEL wrote: Called LVM 10/16/2024 to sched AWV. Please schedule in office or virtual visit.   Nathanel Paschal; Care Guide Ambulatory Clinical Support [place] [facility] Direct Dial: [phone]

## 2024-10-22 ENCOUNTER — Other Ambulatory Visit: Payer: Self-pay | Admitting: Pharmacist

## 2024-10-22 ENCOUNTER — Telehealth: Payer: Self-pay | Admitting: Pharmacist

## 2024-10-22 DIAGNOSIS — I10 Essential (primary) hypertension: Secondary | ICD-10-CM

## 2024-10-22 DIAGNOSIS — E785 Hyperlipidemia, unspecified: Secondary | ICD-10-CM

## 2024-10-22 MED ORDER — ATORVASTATIN CALCIUM 20 MG PO TABS: 20.0000 mg | ORAL_TABLET | Freq: Every day | ORAL | 0 refills | Status: DC

## 2024-10-22 MED ORDER — ATENOLOL 25 MG PO TABS: 25.0000 mg | ORAL_TABLET | Freq: Two times a day (BID) | ORAL | 0 refills | Status: DC

## 2024-10-22 NOTE — Telephone Encounter (Signed)
 Pharmacy Quality Measure Review  This patient is appearing on a report for being at risk of failing the adherence measure for cholesterol (statin) medications this calendar year.   Medication: atorvastatin   Last fill date: 07/16/2024 for 90 day supply  Patient's last PCP is no longer at our practice. She will need to establish with a new PCP. I tried to out reach patient to make appointment but was unable to reach her. Also tried her emergency contact - husband Nevjo Kulik (on DPR). Not able to reach either Mrs. Or Mr. Landers.  LM on VM with office CB# [phone] and request to call to schedule follow up appointment. Also sent patient a MyChart message but it looks like she has  not checked MyChart since 04/2024.   Updated prescriptions for atorvastatin  and atenolol  for 60 days to give patient time to make appointment with new PCP. Other prescriptions glipizide , hydrochlorothiazide  were updated for 90 DS 10/11/2024.     Madelin Ray, PharmD Clinical Pharmacist [facility]  Population Health [phone]

## 2024-12-27 ENCOUNTER — Other Ambulatory Visit: Payer: Self-pay | Admitting: Family Medicine

## 2024-12-27 DIAGNOSIS — E785 Hyperlipidemia, unspecified: Secondary | ICD-10-CM

## 2024-12-27 DIAGNOSIS — I10 Essential (primary) hypertension: Secondary | ICD-10-CM
